# Patient Record
Sex: Female | Born: 1960 | Race: White | Hispanic: No | Smoking: Never smoker
Health system: Southern US, Community
[De-identification: ages and names within clinical notes are randomized; demographics above are authoritative.]

## PROBLEM LIST (undated history)

## (undated) DIAGNOSIS — F419 Anxiety disorder, unspecified: Secondary | ICD-10-CM

## (undated) DIAGNOSIS — M1990 Unspecified osteoarthritis, unspecified site: Secondary | ICD-10-CM

## (undated) DIAGNOSIS — T7840XA Allergy, unspecified, initial encounter: Secondary | ICD-10-CM

## (undated) DIAGNOSIS — E876 Hypokalemia: Secondary | ICD-10-CM

## (undated) DIAGNOSIS — R112 Nausea with vomiting, unspecified: Secondary | ICD-10-CM

## (undated) DIAGNOSIS — J4530 Mild persistent asthma, uncomplicated: Secondary | ICD-10-CM

## (undated) DIAGNOSIS — E785 Hyperlipidemia, unspecified: Secondary | ICD-10-CM

## (undated) DIAGNOSIS — E669 Obesity, unspecified: Secondary | ICD-10-CM

## (undated) DIAGNOSIS — Z124 Encounter for screening for malignant neoplasm of cervix: Principal | ICD-10-CM

## (undated) DIAGNOSIS — I499 Cardiac arrhythmia, unspecified: Secondary | ICD-10-CM

## (undated) DIAGNOSIS — Z0000 Encounter for general adult medical examination without abnormal findings: Secondary | ICD-10-CM

## (undated) DIAGNOSIS — R252 Cramp and spasm: Secondary | ICD-10-CM

## (undated) DIAGNOSIS — R011 Cardiac murmur, unspecified: Secondary | ICD-10-CM

## (undated) DIAGNOSIS — M1712 Unilateral primary osteoarthritis, left knee: Secondary | ICD-10-CM

## (undated) DIAGNOSIS — M722 Plantar fascial fibromatosis: Secondary | ICD-10-CM

## (undated) DIAGNOSIS — C801 Malignant (primary) neoplasm, unspecified: Secondary | ICD-10-CM

## (undated) DIAGNOSIS — I10 Essential (primary) hypertension: Secondary | ICD-10-CM

## (undated) DIAGNOSIS — Z9889 Other specified postprocedural states: Secondary | ICD-10-CM

## (undated) DIAGNOSIS — R609 Edema, unspecified: Secondary | ICD-10-CM

## (undated) DIAGNOSIS — J329 Chronic sinusitis, unspecified: Secondary | ICD-10-CM

## (undated) HISTORY — PX: TUBAL LIGATION: SHX77

## (undated) HISTORY — DX: Edema, unspecified: R60.9

## (undated) HISTORY — DX: Anxiety disorder, unspecified: F41.9

## (undated) HISTORY — DX: Essential (primary) hypertension: I10

## (undated) HISTORY — DX: Encounter for general adult medical examination without abnormal findings: Z00.00

## (undated) HISTORY — DX: Cramp and spasm: R25.2

## (undated) HISTORY — DX: Plantar fascial fibromatosis: M72.2

## (undated) HISTORY — DX: Mild persistent asthma, uncomplicated: J45.30

## (undated) HISTORY — DX: Chronic sinusitis, unspecified: J32.9

## (undated) HISTORY — DX: Encounter for screening for malignant neoplasm of cervix: Z12.4

## (undated) HISTORY — DX: Allergy, unspecified, initial encounter: T78.40XA

## (undated) HISTORY — PX: ABDOMINAL HYSTERECTOMY: SHX81

## (undated) HISTORY — DX: Hypokalemia: E87.6

## (undated) HISTORY — DX: Obesity, unspecified: E66.9

## (undated) HISTORY — DX: Hyperlipidemia, unspecified: E78.5

## (undated) HISTORY — PX: TONSILLECTOMY: SHX5217

## (undated) HISTORY — DX: Unilateral primary osteoarthritis, left knee: M17.12

---
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
## 1998-11-24 ENCOUNTER — Other Ambulatory Visit: Admission: RE | Admit: 1998-11-24 | Discharge: 1998-11-24 | Payer: Self-pay | Admitting: Obstetrics and Gynecology

## 2000-03-19 ENCOUNTER — Encounter: Admission: RE | Admit: 2000-03-19 | Discharge: 2000-03-19 | Payer: Self-pay | Admitting: Obstetrics and Gynecology

## 2000-03-19 ENCOUNTER — Encounter: Payer: Self-pay | Admitting: Obstetrics and Gynecology

## 2000-03-21 ENCOUNTER — Other Ambulatory Visit: Admission: RE | Admit: 2000-03-21 | Discharge: 2000-03-21 | Payer: Self-pay | Admitting: *Deleted

## 2003-06-19 ENCOUNTER — Ambulatory Visit: Admission: RE | Admit: 2003-06-19 | Discharge: 2003-06-19 | Payer: Self-pay | Admitting: *Deleted

## 2003-07-07 ENCOUNTER — Other Ambulatory Visit: Admission: RE | Admit: 2003-07-07 | Discharge: 2003-07-07 | Payer: Self-pay | Admitting: Obstetrics and Gynecology

## 2003-07-09 ENCOUNTER — Encounter: Admission: RE | Admit: 2003-07-09 | Discharge: 2003-07-09 | Payer: Self-pay | Admitting: Obstetrics and Gynecology

## 2003-09-07 ENCOUNTER — Ambulatory Visit (HOSPITAL_COMMUNITY): Admission: RE | Admit: 2003-09-07 | Discharge: 2003-09-07 | Payer: Self-pay | Admitting: Obstetrics and Gynecology

## 2004-01-10 HISTORY — PX: ENDOMETRIAL ABLATION: SHX621

## 2004-01-10 HISTORY — PX: KNEE SURGERY: SHX244

## 2004-08-15 ENCOUNTER — Encounter: Admission: RE | Admit: 2004-08-15 | Discharge: 2004-08-15 | Payer: Self-pay | Admitting: Obstetrics and Gynecology

## 2005-10-04 ENCOUNTER — Encounter: Admission: RE | Admit: 2005-10-04 | Discharge: 2005-10-04 | Payer: Self-pay | Admitting: Obstetrics and Gynecology

## 2005-10-24 ENCOUNTER — Other Ambulatory Visit: Admission: RE | Admit: 2005-10-24 | Discharge: 2005-10-24 | Payer: Self-pay | Admitting: Obstetrics and Gynecology

## 2008-01-10 DIAGNOSIS — I10 Essential (primary) hypertension: Secondary | ICD-10-CM

## 2008-01-10 HISTORY — DX: Essential (primary) hypertension: I10

## 2009-07-09 LAB — HM PAP SMEAR

## 2009-07-09 LAB — HM MAMMOGRAPHY

## 2011-01-30 ENCOUNTER — Ambulatory Visit: Payer: Self-pay | Admitting: Family Medicine

## 2011-02-08 ENCOUNTER — Encounter: Payer: Self-pay | Admitting: Family Medicine

## 2011-02-08 ENCOUNTER — Ambulatory Visit (INDEPENDENT_AMBULATORY_CARE_PROVIDER_SITE_OTHER): Payer: 59 | Admitting: Family Medicine

## 2011-02-08 VITALS — BP 132/87 | HR 101 | Temp 98.2°F | Ht 68.0 in | Wt 228.8 lb

## 2011-02-08 DIAGNOSIS — J329 Chronic sinusitis, unspecified: Secondary | ICD-10-CM

## 2011-02-08 DIAGNOSIS — T7840XA Allergy, unspecified, initial encounter: Secondary | ICD-10-CM

## 2011-02-08 DIAGNOSIS — E669 Obesity, unspecified: Secondary | ICD-10-CM | POA: Insufficient documentation

## 2011-02-08 DIAGNOSIS — Z0000 Encounter for general adult medical examination without abnormal findings: Secondary | ICD-10-CM

## 2011-02-08 DIAGNOSIS — J40 Bronchitis, not specified as acute or chronic: Secondary | ICD-10-CM

## 2011-02-08 DIAGNOSIS — Z9109 Other allergy status, other than to drugs and biological substances: Secondary | ICD-10-CM | POA: Insufficient documentation

## 2011-02-08 DIAGNOSIS — F419 Anxiety disorder, unspecified: Secondary | ICD-10-CM | POA: Insufficient documentation

## 2011-02-08 DIAGNOSIS — I10 Essential (primary) hypertension: Secondary | ICD-10-CM | POA: Insufficient documentation

## 2011-02-08 DIAGNOSIS — F411 Generalized anxiety disorder: Secondary | ICD-10-CM

## 2011-02-08 DIAGNOSIS — Z23 Encounter for immunization: Secondary | ICD-10-CM

## 2011-02-08 LAB — RENAL FUNCTION PANEL
BUN: 14 mg/dL (ref 6–23)
CO2: 29 mEq/L (ref 19–32)
Chloride: 102 mEq/L (ref 96–112)
GFR: 113.85 mL/min (ref >60.00)
Phosphorus: 2.9 mg/dL (ref 2.3–4.6)
Sodium: 142 mEq/L (ref 135–145)

## 2011-02-08 LAB — HEPATIC FUNCTION PANEL
AST: 27 U/L (ref 0–37)
Albumin: 4.2 g/dL (ref 3.5–5.2)
Alkaline Phosphatase: 79 U/L (ref 39–117)
Total Bilirubin: 0.5 mg/dL (ref 0.3–1.2)

## 2011-02-08 LAB — LIPID PANEL
HDL: 35.1 mg/dL — ABNORMAL LOW (ref >39.00)
Triglycerides: 212 mg/dL — ABNORMAL HIGH (ref 0.0–149.0)
VLDL: 42.4 mg/dL — ABNORMAL HIGH (ref 0.0–40.0)

## 2011-02-08 LAB — CBC
Hemoglobin: 14.5 g/dL (ref 12.0–15.0)
MCHC: 34.7 g/dL (ref 30.0–36.0)
RDW: 12.9 % (ref 11.5–14.6)
WBC: 8.8 10*3/uL (ref 4.5–10.5)

## 2011-02-08 MED ORDER — LEVOFLOXACIN 500 MG PO TABS: 500.0000 mg | ORAL_TABLET | Freq: Every day | ORAL | Status: AC

## 2011-02-08 MED ORDER — AMLODIPINE BESYLATE-VALSARTAN 5-160 MG PO TABS: 1.0000 | ORAL_TABLET | Freq: Every day | ORAL | Status: DC

## 2011-02-08 NOTE — Patient Instructions (Signed)

## 2011-02-09 LAB — TSH: TSH: 0.93 u[IU]/mL (ref 0.35–5.50)

## 2011-02-13 ENCOUNTER — Encounter: Payer: Self-pay | Admitting: Family Medicine

## 2011-02-13 DIAGNOSIS — J329 Chronic sinusitis, unspecified: Secondary | ICD-10-CM | POA: Insufficient documentation

## 2011-02-13 DIAGNOSIS — Z0000 Encounter for general adult medical examination without abnormal findings: Secondary | ICD-10-CM

## 2011-02-13 HISTORY — DX: Chronic sinusitis, unspecified: J32.9

## 2011-02-13 HISTORY — DX: Encounter for general adult medical examination without abnormal findings: Z00.00

## 2011-02-13 NOTE — Assessment & Plan Note (Signed)
Increase rest and fluids, Levaquin and Mucinex prescribed and notify us if no improvement

## 2011-02-13 NOTE — Assessment & Plan Note (Signed)
Consider DASH diet and increase activity

## 2011-02-13 NOTE — Assessment & Plan Note (Signed)
Doing well at this time  

## 2011-02-13 NOTE — Assessment & Plan Note (Signed)
Borderline BP today, minimize sodium and reassess at next visit.

## 2011-02-13 NOTE — Progress Notes (Signed)
Patient ID: Kathleen Church, female   DOB: [birth year], 51 y.o.   MRN: [id number] Kathleen Church [id number] [birth year] 02/13/2011      Progress Note New Patient  Subjective  Chief Complaint  Chief Complaint  Patient presents with  . Establish Care    new patient    HPI  She is a 51 year old Caucasian female who is in today for new patient appointment. She's been struggling with respiratory symptoms for the last week. Recently has had a Z-Pak and did not feel that was helpful. She has cough productive of green phlegm at times. Nasal congestion but no rhinorrhea. Some postnasal drip and malaise are also noted. Denies fevers, headaches, ear pain. Denies chest pain, palpitations. Does speak of allergies and flares in difficulty with some wheezing and shortness of breath whenever she becomes ill her allergies flare. Does use albuterol at times could affect when this happens. No other recent complaints. He underwent an endometrial ablation in 2006 for heavy bleeding but is having no difficulties now. No GI or GU complaints.  Past Medical History  Diagnosis Date  . Chicken pox as a child  . Measles as a child  . Mumps as a child  . Anxiety     when father passed  . Hypertension 2010  . Allergic state   . Obesity   . Preventative health care 02/13/2011  . Sinusitis 02/13/2011    Past Surgical History  Procedure Date  . Endometrial ablation 2006    menorraghia  . Knee surgery 2006    left knee  . Tonsillectomy   . Tubal ligation     Family History  Problem Relation Age of Onset  . Cancer Father     kidney  . Cancer Maternal Grandmother     bone  . Cancer Paternal Grandmother     breast  . Asthma Daughter   . Heart attack Paternal Grandfather     History   Social History  . Marital Status: Married    Spouse Name: N/A    Number of Children: N/A  . Years of Education: N/A   Occupational History  . Not on file.   Social History Main Topics  . Smoking status: Never  Smoker   . Smokeless tobacco: Never Used  . Alcohol Use: No  . Drug Use: No  . Sexually Active: Yes -- Female partner(s)   Other Topics Concern  . Not on file   Social History Narrative  . No narrative on file    No current outpatient prescriptions on file prior to visit.    Allergies  Allergen Reactions  . Codeine Palpitations and Rash    Review of Systems  Review of Systems  Constitutional: Negative for fever, chills and malaise/fatigue.  HENT: Positive for congestion. Negative for hearing loss and nosebleeds.   Eyes: Negative for discharge.  Respiratory: Positive for cough, sputum production and shortness of breath. Negative for wheezing.   Cardiovascular: Negative for chest pain, palpitations and leg swelling.  Gastrointestinal: Negative for heartburn, nausea, vomiting, abdominal pain, diarrhea, constipation and blood in stool.  Genitourinary: Negative for dysuria, urgency, frequency and hematuria.       No h/o abnl paps, last pap in 07/2009, last [facility] 7/11  Musculoskeletal: Negative for myalgias, back pain and falls.  Skin: Negative for rash.  Neurological: Negative for dizziness, tremors, sensory change, focal weakness, loss of consciousness, weakness and headaches.  Endo/Heme/Allergies: Negative for polydipsia. Does not bruise/bleed easily.  Psychiatric/Behavioral: Negative for depression and  suicidal ideas. The patient is not nervous/anxious and does not have insomnia.     Objective  BP 132/87  Pulse 101  Temp(Src) 98.2 F (36.8 C) (Temporal)  Ht 5\' 8"  (1.727 m)  Wt 228 lb 12.8 oz (103.783 kg)  BMI 34.79 kg/m2  SpO2 96%  Physical Exam  Physical Exam  Constitutional: She is oriented to person, place, and time and well-developed, well-nourished, and in no distress. No distress.  HENT:  Head: Normocephalic and atraumatic.  Right Ear: External ear normal.  Left Ear: External ear normal.  Nose: Nose normal.  Mouth/Throat: Oropharynx is clear and moist. No  oropharyngeal exudate.  Eyes: Conjunctivae are normal. Pupils are equal, round, and reactive to light. Right eye exhibits no discharge. Left eye exhibits no discharge. No scleral icterus.  Neck: Normal range of motion. Neck supple. No thyromegaly present.  Cardiovascular: Normal rate, regular rhythm, normal heart sounds and intact distal pulses.   No murmur heard. Pulmonary/Chest: Effort normal and breath sounds normal. No respiratory distress. She has no wheezes. She has no rales.  Abdominal: Soft. Bowel sounds are normal. She exhibits no distension and no mass. There is no tenderness.  Musculoskeletal: Normal range of motion. She exhibits no edema and no tenderness.  Lymphadenopathy:    She has no cervical adenopathy.  Neurological: She is alert and oriented to person, place, and time. She has normal reflexes. No cranial nerve deficit. Coordination normal.  Skin: Skin is warm and dry. No rash noted. She is not diaphoretic.  Psychiatric: Mood, memory and affect normal.       Assessment & Plan  Preventative health care Patient agrees to [facility] and FOBT today but declines colonoscopy despite long discussion on benefits, will let us know if she changes her mind  Hypertension Borderline BP today, minimize sodium and reassess at next visit.  Sinusitis Increase rest and fluids, Levaquin and Mucinex prescribed and notify us if no improvement  Anxiety Doing well at this time  Obesity Consider DASH diet and increase activity  Allergic state Has some SOB with illness and allergic flares, responsive to Albuterol consider some allergy triggered Asthma, will cont Albuterol prn and Singulair daily

## 2011-02-13 NOTE — Assessment & Plan Note (Signed)
Patient agrees to [facility] and FOBT today but declines colonoscopy despite long discussion on benefits, will let us know if she changes her mind

## 2011-02-13 NOTE — Assessment & Plan Note (Signed)
Has some SOB with illness and allergic flares, responsive to Albuterol consider some allergy triggered Asthma, will cont Albuterol prn and Singulair daily

## 2011-02-22 ENCOUNTER — Other Ambulatory Visit: Payer: Self-pay | Admitting: Family Medicine

## 2011-02-22 DIAGNOSIS — Z1231 Encounter for screening mammogram for malignant neoplasm of breast: Secondary | ICD-10-CM

## 2011-02-24 ENCOUNTER — Encounter: Payer: Self-pay | Admitting: Family Medicine

## 2011-02-24 ENCOUNTER — Ambulatory Visit (INDEPENDENT_AMBULATORY_CARE_PROVIDER_SITE_OTHER): Payer: 59 | Admitting: Family Medicine

## 2011-02-24 VITALS — BP 133/85 | HR 100 | Temp 97.3°F | Wt 235.0 lb

## 2011-02-24 DIAGNOSIS — J45909 Unspecified asthma, uncomplicated: Secondary | ICD-10-CM

## 2011-02-24 DIAGNOSIS — T7840XA Allergy, unspecified, initial encounter: Secondary | ICD-10-CM

## 2011-02-24 MED ORDER — PREDNISONE 20 MG PO TABS: ORAL_TABLET | ORAL | Status: DC

## 2011-02-24 MED ORDER — HYDROCOD POLST-CHLORPHEN POLST 10-8 MG/5ML PO LQCR: 5.0000 mL | Freq: Two times a day (BID) | ORAL | Status: DC

## 2011-02-24 MED ORDER — ALBUTEROL SULFATE HFA 108 (90 BASE) MCG/ACT IN AERS: 2.0000 | INHALATION_SPRAY | Freq: Four times a day (QID) | RESPIRATORY_TRACT | Status: DC | PRN

## 2011-02-24 NOTE — Progress Notes (Signed)
OFFICE NOTE  02/24/2011  CC:  Chief Complaint  Patient presents with  . Cough    x 2 weeks     HPI: Patient is a 51 y.o. Caucasian female who is here for cough. Pt presents complaining of respiratory symptoms for 2-3 weeks.  Mostly dry cough.  Some wheezing occasionally.  Tm 100.  Albuterol inhaler helps some.  Worst symptoms seems to be the persistent dry cough/hacky--keeping her up all night.  Lately the symptoms seem to be staying the same.  No pain in face or teeth.  No significant HA.  ST mild at most.  Symptoms made worse by cool air, night time.  Symptoms improved by albuterol and mucinex congestion OTC. Smoker? no Recent sick contact? unknown Muscle or joint aches? no Flu shot this season at least 2 wks ago? yes  ROS: no n/v/d or abdominal pain.  No rash.  No neck stiffness.   +Mild fatigue.  +Mild appetite loss.    Pertinent PMH:  Past Medical History  Diagnosis Date  . Asthma, mild persistent     adult onset  . Measles as a child  . Mumps as a child  . Anxiety     when father passed  . Hypertension 2010  . Allergic state   . Obesity   . Preventative health care 02/13/2011  . Sinusitis 02/13/2011     MEDS:  Outpatient Prescriptions Prior to Visit  Medication Sig Dispense Refill  . albuterol (PROVENTIL HFA;VENTOLIN HFA) 108 (90 BASE) MCG/ACT inhaler Inhale 2 puffs into the lungs every 6 (six) hours as needed.      . ALPRAZolam (XANAX) 0.5 MG tablet Take 0.5 mg by mouth every 6 (six) hours as needed.      Marland Kitchen amLODipine-valsartan (EXFORGE) 5-160 MG per tablet Take 1 tablet by mouth daily.  30 tablet  3  . montelukast (SINGULAIR) 10 MG tablet Take 10 mg by mouth at bedtime.        PE: Blood pressure 133/85, pulse 100, temperature 97.3 F (36.3 C), temperature source Temporal, weight 235 lb (106.595 kg), SpO2 97.00%. VS: noted--normal. Gen: alert, NAD, NONTOXIC APPEARING. HEENT: eyes without injection, drainage, or swelling.  Ears: EACs clear, TMs with normal  light reflex and landmarks.  Nose: Clear rhinorrhea, with some dried, crusty exudate adherent to mildly injected mucosa.  No purulent d/c.  No paranasal sinus TTP.  No facial swelling.  Throat and mouth without focal lesion.  No pharyngial swelling, erythema, or exudate.   Neck: supple, no LAD.   LUNGS: Inspiratory rhonchi diffusely.  Decreased aeration on exhalation.  No wheezing or crackles.  Nonlabored resps.   CV: RRR, no m/r/g. EXT: no c/c/e SKIN: no rash    IMPRESSION AND PLAN: Asthmatic bronchitis Prednisone 40mg  qd x 5d, then 20mg  qd x 5d. Albuterol q4h prn. Tussionex 1 tsp q12 h prn. Start pulmicort 180 HFA, 1 puff bid---she has shown sufficient chronicity of sx's to qualify for controller med.      FOLLOW UP: she has f/u in March with Dr. Abner Greenspan

## 2011-02-27 ENCOUNTER — Encounter: Payer: Self-pay | Admitting: Family Medicine

## 2011-02-27 DIAGNOSIS — J45909 Unspecified asthma, uncomplicated: Secondary | ICD-10-CM | POA: Insufficient documentation

## 2011-02-27 NOTE — Assessment & Plan Note (Signed)
Prednisone 40mg  qd x 5d, then 20mg  qd x 5d. Albuterol q4h prn. Tussionex 1 tsp q12 h prn. Start pulmicort 180 HFA, 1 puff bid---she has shown sufficient chronicity of sx's to qualify for controller med.

## 2011-02-28 ENCOUNTER — Telehealth: Payer: Self-pay | Admitting: *Deleted

## 2011-02-28 MED ORDER — AZITHROMYCIN 250 MG PO TABS: 250.0000 mg | ORAL_TABLET | Freq: Every day | ORAL | Status: DC

## 2011-02-28 NOTE — Telephone Encounter (Signed)
Left a detailed message on patients voicemail and sent RX to pharmacy 

## 2011-02-28 NOTE — Telephone Encounter (Signed)
Pt was seen on 2/15 by Dr. Milinda Cave and diagnosed with asthma.  Pt still has cough, and now that cough is productive with green mucous.  Cough is worse when pt lies down.  Unsure if she has had fever.  Pt was in office with daughter yesterday and states you were able to hear her cough. Pt would like RX for abx. Please advise if appropriate.

## 2011-02-28 NOTE — Telephone Encounter (Signed)
Have her increase her Pulmicort to 2 puffs po bid and give her Azithromycin 250 mg tab, 2 tabs po once and then 1 tab po daily x 4 days.

## 2011-03-06 ENCOUNTER — Ambulatory Visit
Admission: RE | Admit: 2011-03-06 | Discharge: 2011-03-06 | Disposition: A | Discharge status: Home / Self Care | Payer: 59 | Source: Ambulatory Visit | Attending: Family Medicine | Admitting: Family Medicine

## 2011-03-06 DIAGNOSIS — Z1231 Encounter for screening mammogram for malignant neoplasm of breast: Secondary | ICD-10-CM

## 2011-03-06 LAB — HM MAMMOGRAPHY

## 2011-03-27 ENCOUNTER — Ambulatory Visit: Payer: 59 | Admitting: Family Medicine

## 2011-04-05 ENCOUNTER — Ambulatory Visit: Payer: 59 | Admitting: Family Medicine

## 2011-04-27 ENCOUNTER — Other Ambulatory Visit (HOSPITAL_COMMUNITY)
Admission: RE | Admit: 2011-04-27 | Discharge: 2011-04-27 | Disposition: A | Discharge status: Home / Self Care | Payer: 59 | Source: Ambulatory Visit | Attending: Family Medicine | Admitting: Family Medicine

## 2011-04-27 ENCOUNTER — Ambulatory Visit (INDEPENDENT_AMBULATORY_CARE_PROVIDER_SITE_OTHER): Payer: 59 | Admitting: Family Medicine

## 2011-04-27 ENCOUNTER — Encounter: Payer: Self-pay | Admitting: Family Medicine

## 2011-04-27 VITALS — BP 126/91 | HR 103 | Temp 98.0°F | Ht 68.0 in | Wt 232.0 lb

## 2011-04-27 DIAGNOSIS — Z124 Encounter for screening for malignant neoplasm of cervix: Secondary | ICD-10-CM

## 2011-04-27 DIAGNOSIS — J45909 Unspecified asthma, uncomplicated: Secondary | ICD-10-CM

## 2011-04-27 DIAGNOSIS — I10 Essential (primary) hypertension: Secondary | ICD-10-CM

## 2011-04-27 DIAGNOSIS — N760 Acute vaginitis: Secondary | ICD-10-CM

## 2011-04-27 DIAGNOSIS — E876 Hypokalemia: Secondary | ICD-10-CM

## 2011-04-27 DIAGNOSIS — Z01419 Encounter for gynecological examination (general) (routine) without abnormal findings: Secondary | ICD-10-CM | POA: Insufficient documentation

## 2011-04-27 DIAGNOSIS — R609 Edema, unspecified: Secondary | ICD-10-CM

## 2011-04-27 DIAGNOSIS — E785 Hyperlipidemia, unspecified: Secondary | ICD-10-CM

## 2011-04-27 DIAGNOSIS — E669 Obesity, unspecified: Secondary | ICD-10-CM

## 2011-04-27 HISTORY — DX: Edema, unspecified: R60.9

## 2011-04-27 HISTORY — DX: Encounter for screening for malignant neoplasm of cervix: Z12.4

## 2011-04-27 HISTORY — DX: Hyperlipidemia, unspecified: E78.5

## 2011-04-27 HISTORY — DX: Hypokalemia: E87.6

## 2011-04-27 LAB — RENAL FUNCTION PANEL
BUN: 13 mg/dL (ref 6–23)
CO2: 29 mEq/L (ref 19–32)
Chloride: 103 mEq/L (ref 96–112)
GFR: 111.57 mL/min (ref >60.00)
Sodium: 139 mEq/L (ref 135–145)

## 2011-04-27 MED ORDER — AMLODIPINE BESYLATE-VALSARTAN 5-160 MG PO TABS: 1.0000 | ORAL_TABLET | Freq: Every day | ORAL | Status: DC

## 2011-04-27 MED ORDER — BUDESONIDE 180 MCG/ACT IN AEPB: 2.0000 | INHALATION_SPRAY | Freq: Two times a day (BID) | RESPIRATORY_TRACT | Status: DC

## 2011-04-27 NOTE — Assessment & Plan Note (Signed)
Elevate feet, avoid sodium

## 2011-04-27 NOTE — Assessment & Plan Note (Addendum)
Pap today, no concerns. Some discharge noted will check candida and BV today as well. If any further bleeding will need a work up

## 2011-04-27 NOTE — Progress Notes (Signed)
Patient ID: Kathleen Church, female   DOB: May 17, 1960, 51 y.o.   MRN: [id number] Kathleen Church [id number] 1960-07-27 04/27/2011      Progress Note-Follow Up  Subjective  Chief Complaint  Chief Complaint  Patient presents with  . Gynecologic Exam    pap    HPI  This is a 51 year old Caucasian female in today for GYN visit and followup. Overall she reports she feels well. She was seen recently with asthmatic bronchitis and started on Pulmicort. She's had a good response. She says this time every year she tends to get bronchitis or sinusitis and so far that has not happened. She denies any coughing, wheezing recent albuterol use, fevers or chills. She denies any chest pain or palpitations, shortness of breath, headache, congestion, GI or GU complaints. Should her mammogram in February which was normal.  Past Medical History  Diagnosis Date  . Asthma, mild persistent     adult onset  . Measles as a child  . Mumps as a child  . Anxiety     when father passed  . Hypertension 2010  . Allergic state   . Obesity   . Preventative health care 02/13/2011  . Sinusitis 02/13/2011  . Cervical cancer screening 04/27/2011  . Hypokalemia 04/27/2011  . Edema 04/27/2011    Past Surgical History  Procedure Date  . Endometrial ablation 2006    menorraghia  . Knee surgery 2006    left knee  . Tonsillectomy   . Tubal ligation     Family History  Problem Relation Age of Onset  . Cancer Father     kidney  . Cancer Maternal Grandmother     bone  . Cancer Paternal Grandmother     breast  . Asthma Daughter   . Heart attack Paternal Grandfather     History   Social History  . Marital Status: Married    Spouse Name: N/A    Number of Children: N/A  . Years of Education: N/A   Occupational History  . Not on file.   Social History Main Topics  . Smoking status: Never Smoker   . Smokeless tobacco: Never Used  . Alcohol Use: No  . Drug Use: No  . Sexually Active: Yes -- Female partner(s)    Other Topics Concern  . Not on file   Social History Narrative  . No narrative on file    Current Outpatient Prescriptions on File Prior to Visit  Medication Sig Dispense Refill  . albuterol (PROVENTIL HFA;VENTOLIN HFA) 108 (90 BASE) MCG/ACT inhaler Inhale 2 puffs into the lungs every 6 (six) hours as needed.  1 Inhaler  0  . montelukast (SINGULAIR) 10 MG tablet Take 10 mg by mouth at bedtime.      Marland Kitchen DISCONTD: amLODipine-valsartan (EXFORGE) 5-160 MG per tablet Take 1 tablet by mouth daily.  30 tablet  3  . DISCONTD: budesonide (PULMICORT) 180 MCG/ACT inhaler Inhale 2 puffs into the lungs 2 (two) times daily.       Marland Kitchen ALPRAZolam (XANAX) 0.5 MG tablet Take 0.5 mg by mouth every 6 (six) hours as needed.        Allergies  Allergen Reactions  . Codeine Palpitations and Rash    Review of Systems  Review of Systems  Constitutional: Positive for malaise/fatigue. Negative for fever.  HENT: Negative for congestion.   Eyes: Negative for discharge.  Respiratory: Negative for shortness of breath.   Cardiovascular: Positive for leg swelling. Negative for chest pain and palpitations.  Gastrointestinal: Negative for nausea, abdominal pain and diarrhea.  Genitourinary: Negative for dysuria.  Musculoskeletal: Negative for falls.  Skin: Negative for rash.  Neurological: Negative for loss of consciousness and headaches.  Endo/Heme/Allergies: Negative for polydipsia.  Psychiatric/Behavioral: Negative for depression and suicidal ideas. The patient is not nervous/anxious and does not have insomnia.     Objective  BP 126/91  Pulse 103  Temp(Src) 98 F (36.7 C) (Temporal)  Ht 5\' 8"  (1.727 m)  Wt 232 lb (105.235 kg)  BMI 35.28 kg/m2  SpO2 95%  Physical Exam  Physical Exam  Constitutional: She is oriented to person, place, and time and well-developed, well-nourished, and in no distress. No distress.  HENT:  Head: Normocephalic and atraumatic.  Eyes: Conjunctivae are normal.  Neck:  Neck supple. No thyromegaly present.  Cardiovascular: Normal rate, regular rhythm and normal heart sounds.   No murmur heard. Pulmonary/Chest: Effort normal and breath sounds normal. She has no wheezes.  Abdominal: She exhibits no distension and no mass.  Genitourinary: Uterus normal, right adnexa normal and left adnexa normal. Vaginal discharge found.       Thick white, vaginal discharge, cervix with slight bleeding with pap  Musculoskeletal: She exhibits no edema.  Lymphadenopathy:    She has no cervical adenopathy.  Neurological: She is alert and oriented to person, place, and time.  Skin: Skin is warm and dry. No rash noted. She is not diaphoretic.  Psychiatric: Memory, affect and judgment normal.    Lab Results  Component Value Date   TSH 0.93 02/08/2011   Lab Results  Component Value Date   WBC 8.8 02/08/2011   HGB 14.5 02/08/2011   HCT 41.8 02/08/2011   MCV 93.1 02/08/2011   PLT 352.0 02/08/2011   Lab Results  Component Value Date   CREATININE 0.6 02/08/2011   BUN 14 02/08/2011   NA 142 02/08/2011   K 3.3* 02/08/2011   CL 102 02/08/2011   CO2 29 02/08/2011   Lab Results  Component Value Date   ALT 34 02/08/2011   AST 27 02/08/2011   ALKPHOS 79 02/08/2011   BILITOT 0.5 02/08/2011   Lab Results  Component Value Date   CHOL 209* 02/08/2011   Lab Results  Component Value Date   HDL 35.10* 02/08/2011   No results found for this basename: [facility]   Lab Results  Component Value Date   TRIG 212.0* 02/08/2011   Lab Results  Component Value Date   CHOLHDL 6 02/08/2011     Assessment & Plan  Cervical cancer screening Pap today, no concerns. Some discharge noted will check candida and BV today as well. If any further bleeding will need a work up  Asthmatic bronchitis Resolved with addition of Pulmicort will continue, refill and coupon given  Hypertension Adequately controlled on current meds. No changes refill given  Hypokalemia Mildly depressed and some cramping in  feet noted, recheck renal today  Obesity Encouraged increased activity  Edema Elevate feet, avoid sodium  Hyperlipidemia Mild avoid trans fats, start MegaRed krill oil caps daily recheck in 6 months, increase exercsie

## 2011-04-27 NOTE — Assessment & Plan Note (Signed)
Adequately controlled on current meds. No changes refill given

## 2011-04-27 NOTE — Assessment & Plan Note (Signed)
Mild avoid trans fats, start MegaRed krill oil caps daily recheck in 6 months, increase exercsie

## 2011-04-27 NOTE — Assessment & Plan Note (Signed)
Mildly depressed and some cramping in feet noted, recheck renal today

## 2011-04-27 NOTE — Assessment & Plan Note (Signed)
Encouraged increased activity.

## 2011-04-27 NOTE — Patient Instructions (Signed)

## 2011-04-27 NOTE — Assessment & Plan Note (Signed)
Resolved with addition of Pulmicort will continue, refill and coupon given

## 2011-05-09 ENCOUNTER — Other Ambulatory Visit: Payer: 59

## 2011-05-09 DIAGNOSIS — Z1211 Encounter for screening for malignant neoplasm of colon: Secondary | ICD-10-CM

## 2011-05-09 LAB — FECAL OCCULT BLOOD, IMMUNOCHEMICAL: Fecal Occult Bld: NEGATIVE

## 2011-06-20 ENCOUNTER — Other Ambulatory Visit: Payer: Self-pay

## 2011-06-20 DIAGNOSIS — T7840XA Allergy, unspecified, initial encounter: Secondary | ICD-10-CM

## 2011-06-20 MED ORDER — MONTELUKAST SODIUM 10 MG PO TABS: 10.0000 mg | ORAL_TABLET | Freq: Every day | ORAL | Status: DC

## 2011-07-12 ENCOUNTER — Other Ambulatory Visit: Payer: Self-pay

## 2011-07-12 DIAGNOSIS — I10 Essential (primary) hypertension: Secondary | ICD-10-CM

## 2011-07-12 MED ORDER — AMLODIPINE BESYLATE-VALSARTAN 5-160 MG PO TABS: 1.0000 | ORAL_TABLET | Freq: Every day | ORAL | Status: DC

## 2011-10-11 ENCOUNTER — Other Ambulatory Visit: Payer: Self-pay

## 2011-10-11 DIAGNOSIS — I10 Essential (primary) hypertension: Secondary | ICD-10-CM

## 2011-10-11 MED ORDER — AMLODIPINE BESYLATE-VALSARTAN 5-160 MG PO TABS: 1.0000 | ORAL_TABLET | Freq: Every day | ORAL | Status: DC

## 2011-12-13 ENCOUNTER — Other Ambulatory Visit: Payer: Self-pay | Admitting: Family Medicine

## 2012-01-12 ENCOUNTER — Other Ambulatory Visit: Payer: Self-pay | Admitting: Family Medicine

## 2012-03-13 ENCOUNTER — Other Ambulatory Visit: Payer: Self-pay | Admitting: Family Medicine

## 2012-03-13 NOTE — Telephone Encounter (Signed)
RX filled and left a detailed message stating that pt will need an appt for any addt refills

## 2012-03-15 ENCOUNTER — Ambulatory Visit: Payer: 59 | Admitting: Family Medicine

## 2012-04-15 ENCOUNTER — Telehealth: Payer: Self-pay | Admitting: Family Medicine

## 2012-04-15 MED ORDER — AMLODIPINE BESYLATE-VALSARTAN 5-160 MG PO TABS: 1.0000 | ORAL_TABLET | Freq: Every day | ORAL | Status: DC

## 2012-04-15 NOTE — Telephone Encounter (Signed)
19 tablets sent to pharmacy. Pt will have to keep this appt for addt refills

## 2012-05-03 ENCOUNTER — Encounter: Payer: Self-pay | Admitting: Family Medicine

## 2012-05-03 ENCOUNTER — Ambulatory Visit (INDEPENDENT_AMBULATORY_CARE_PROVIDER_SITE_OTHER): Payer: 59 | Admitting: Family Medicine

## 2012-05-03 VITALS — BP 145/89 | HR 96 | Temp 97.8°F | Ht 68.0 in | Wt 236.8 lb

## 2012-05-03 DIAGNOSIS — I10 Essential (primary) hypertension: Secondary | ICD-10-CM

## 2012-05-03 DIAGNOSIS — E785 Hyperlipidemia, unspecified: Secondary | ICD-10-CM

## 2012-05-03 DIAGNOSIS — F411 Generalized anxiety disorder: Secondary | ICD-10-CM

## 2012-05-03 DIAGNOSIS — Z5189 Encounter for other specified aftercare: Secondary | ICD-10-CM

## 2012-05-03 DIAGNOSIS — Z0000 Encounter for general adult medical examination without abnormal findings: Secondary | ICD-10-CM

## 2012-05-03 DIAGNOSIS — E669 Obesity, unspecified: Secondary | ICD-10-CM

## 2012-05-03 DIAGNOSIS — E876 Hypokalemia: Secondary | ICD-10-CM

## 2012-05-03 DIAGNOSIS — R609 Edema, unspecified: Secondary | ICD-10-CM

## 2012-05-03 DIAGNOSIS — Z124 Encounter for screening for malignant neoplasm of cervix: Secondary | ICD-10-CM

## 2012-05-03 DIAGNOSIS — R252 Cramp and spasm: Secondary | ICD-10-CM

## 2012-05-03 DIAGNOSIS — F419 Anxiety disorder, unspecified: Secondary | ICD-10-CM

## 2012-05-03 DIAGNOSIS — T7840XD Allergy, unspecified, subsequent encounter: Secondary | ICD-10-CM

## 2012-05-03 LAB — TSH: TSH: 1.12 u[IU]/mL (ref 0.35–5.50)

## 2012-05-03 LAB — HEPATIC FUNCTION PANEL
Albumin: 4 g/dL (ref 3.5–5.2)
Bilirubin, Direct: 0.1 mg/dL (ref 0.0–0.3)
Total Protein: 7.3 g/dL (ref 6.0–8.3)

## 2012-05-03 LAB — LIPID PANEL
Cholesterol: 198 mg/dL (ref 0–200)
LDL Cholesterol: 133 mg/dL — ABNORMAL HIGH (ref 0–99)
Total CHOL/HDL Ratio: 6
Triglycerides: 170 mg/dL — ABNORMAL HIGH (ref 0.0–149.0)
VLDL: 34 mg/dL (ref 0.0–40.0)

## 2012-05-03 LAB — CBC
HCT: 43 % (ref 36.0–46.0)
RDW: 12.8 % (ref 11.5–14.6)
WBC: 9.3 10*3/uL (ref 4.5–10.5)

## 2012-05-03 LAB — RENAL FUNCTION PANEL
Albumin: 4 g/dL (ref 3.5–5.2)
Calcium: 9.3 mg/dL (ref 8.4–10.5)
Chloride: 102 mEq/L (ref 96–112)
Creatinine, Ser: 0.6 mg/dL (ref 0.4–1.2)
Potassium: 3.9 mEq/L (ref 3.5–5.1)

## 2012-05-03 MED ORDER — VALSARTAN-HYDROCHLOROTHIAZIDE 160-12.5 MG PO TABS: 1.0000 | ORAL_TABLET | Freq: Every day | ORAL | Status: DC

## 2012-05-03 MED ORDER — ALPRAZOLAM 0.5 MG PO TABS: 0.5000 mg | ORAL_TABLET | Freq: Four times a day (QID) | ORAL | Status: DC | PRN

## 2012-05-03 NOTE — Patient Instructions (Signed)

## 2012-05-05 NOTE — Assessment & Plan Note (Signed)
Encouraged 8 hours of sleep, regular exercise, heart healthy diet, reviewed fasting labs with patient

## 2012-05-05 NOTE — Assessment & Plan Note (Signed)
Avoid trans fats, increase exercise, start krill oil caps, minimize saturated carbs

## 2012-05-05 NOTE — Assessment & Plan Note (Signed)
Resolved with lab work

## 2012-05-05 NOTE — Assessment & Plan Note (Signed)
Worse in pm better in am, encouraged elevate above heart, minimize sodium try compression hose.

## 2012-05-05 NOTE — Assessment & Plan Note (Signed)
Pap not due and no gyn c/o

## 2012-05-05 NOTE — Assessment & Plan Note (Signed)
tolerable with current meds continue same

## 2012-05-05 NOTE — Assessment & Plan Note (Signed)
Encouraged DASH diet, decreased po intake and increased exercise

## 2012-05-05 NOTE — Progress Notes (Signed)
Patient ID: Kathleen Church, female   DOB: 06-19-1960, 52 y.o.   MRN: [id number] [name] [id number] [birth year] 05/05/2012      Progress Note-Follow Up  Subjective  Chief Complaint  Chief Complaint  Patient presents with  . Annual Exam    physical    HPI  Patient is a 52 year old Caucasian female who is in today for annual exam. Overall she feels well. She does note some increased edema. Worse at the end of the day and better with rest. No chest pain or palpitations. No shortness of breath. No GI or GU concerns. Allergies are generally well controlled on current meds. No recent knee for baseline. No recent asthmatic flares. No greater than exercise unfortunately  Past Medical History  Diagnosis Date  . Asthma, mild persistent     adult onset  . Measles as a child  . Mumps as a child  . Anxiety     when father passed  . Hypertension 2010  . Allergic state   . Obesity   . Preventative health care 02/13/2011  . Sinusitis 02/13/2011  . Cervical cancer screening 04/27/2011  . Hypokalemia 04/27/2011  . Edema 04/27/2011  . Hyperlipidemia 04/27/2011    Past Surgical History  Procedure Laterality Date  . Endometrial ablation  2006    menorraghia  . Knee surgery  2006    left knee  . Tonsillectomy    . Tubal ligation      Family History  Problem Relation Age of Onset  . Cancer Father     kidney  . Cancer Maternal Grandmother     bone  . Cancer Paternal Grandmother     breast  . Asthma Daughter   . Heart attack Paternal Grandfather   . Asthma Daughter     History   Social History  . Marital Status: Married    Spouse Name: N/A    Number of Children: N/A  . Years of Education: N/A   Occupational History  . Not on file.   Social History Main Topics  . Smoking status: Never Smoker   . Smokeless tobacco: Never Used  . Alcohol Use: No  . Drug Use: No  . Sexually Active: Yes -- Female partner(s)   Other Topics Concern  . Not on file   Social History Narrative   . No narrative on file    Current Outpatient Prescriptions on File Prior to Visit  Medication Sig Dispense Refill  . albuterol (PROVENTIL HFA;VENTOLIN HFA) 108 (90 BASE) MCG/ACT inhaler Inhale 2 puffs into the lungs every 6 (six) hours as needed.  1 Inhaler  0  . budesonide (PULMICORT) 180 MCG/ACT inhaler Inhale 2 puffs into the lungs 2 (two) times daily.  1 Inhaler  5  . montelukast (SINGULAIR) 10 MG tablet TAKE 1 TABLET (10 MG TOTAL) BY MOUTH AT BEDTIME.  90 tablet  1   No current facility-administered medications on file prior to visit.    Allergies  Allergen Reactions  . Codeine Palpitations and Rash    Review of Systems  Review of Systems  Constitutional: Negative for fever, chills and malaise/fatigue.  HENT: Negative for hearing loss, nosebleeds and congestion.   Eyes: Negative for discharge.  Respiratory: Negative for cough, sputum production, shortness of breath and wheezing.   Cardiovascular: Positive for leg swelling. Negative for chest pain and palpitations.  Gastrointestinal: Negative for heartburn, nausea, vomiting, abdominal pain, diarrhea, constipation and blood in stool.  Genitourinary: Negative for dysuria, urgency, frequency and hematuria.  Musculoskeletal: Negative for myalgias, back pain and falls.  Skin: Negative for rash.  Neurological: Negative for dizziness, tremors, sensory change, focal weakness, loss of consciousness, weakness and headaches.  Endo/Heme/Allergies: Negative for polydipsia. Does not bruise/bleed easily.  Psychiatric/Behavioral: Negative for depression and suicidal ideas. The patient is not nervous/anxious and does not have insomnia.     Objective  BP 145/89  Pulse 96  Temp(Src) 97.8 F (36.6 C) (Temporal)  Ht 5\' 8"  (1.727 m)  Wt 236 lb 12.8 oz (107.412 kg)  BMI 36.01 kg/m2  SpO2 95%  Physical Exam  Physical Exam  Constitutional: She is oriented to person, place, and time and well-developed, well-nourished, and in no distress.  No distress.  HENT:  Head: Normocephalic and atraumatic.  Right Ear: External ear normal.  Left Ear: External ear normal.  Nose: Nose normal.  Mouth/Throat: Oropharynx is clear and moist. No oropharyngeal exudate.  Eyes: Conjunctivae and EOM are normal. Pupils are equal, round, and reactive to light. Right eye exhibits no discharge. Left eye exhibits no discharge.  Neck: Neck supple. No thyromegaly present.  Cardiovascular: Normal rate, regular rhythm and normal heart sounds.   No murmur heard. Pulmonary/Chest: Effort normal and breath sounds normal. She has no wheezes.  Abdominal: Soft. Bowel sounds are normal. She exhibits no distension and no mass. There is no tenderness. There is no rebound and no guarding.  Musculoskeletal: She exhibits no edema.  Lymphadenopathy:    She has no cervical adenopathy.  Neurological: She is alert and oriented to person, place, and time. She has normal reflexes.  Skin: Skin is warm and dry. No rash noted. She is not diaphoretic.  Psychiatric: Memory, affect and judgment normal.    Lab Results  Component Value Date   TSH 1.12 05/03/2012   Lab Results  Component Value Date   WBC 9.3 05/03/2012   HGB 14.6 05/03/2012   HCT 43.0 05/03/2012   MCV 93.4 05/03/2012   PLT 389.0 05/03/2012   Lab Results  Component Value Date   CREATININE 0.6 05/03/2012   BUN 10 05/03/2012   NA 140 05/03/2012   K 3.9 05/03/2012   CL 102 05/03/2012   CO2 29 05/03/2012   Lab Results  Component Value Date   ALT 38* 05/03/2012   AST 28 05/03/2012   ALKPHOS 93 05/03/2012   BILITOT 0.5 05/03/2012   Lab Results  Component Value Date   CHOL 198 05/03/2012   Lab Results  Component Value Date   HDL 31.30* 05/03/2012   Lab Results  Component Value Date   LDLCALC 133* 05/03/2012   Lab Results  Component Value Date   TRIG 170.0* 05/03/2012   Lab Results  Component Value Date   CHOLHDL 6 05/03/2012     Assessment & Plan  Cervical cancer screening Pap not due and no gyn  c/o  Hyperlipidemia Avoid trans fats, increase exercise, start krill oil caps, minimize saturated carbs  Obesity Encouraged DASH diet, decreased po intake and increased exercise  Allergic state tolerable with current meds continue same  Edema Worse in pm better in am, encouraged elevate above heart, minimize sodium try compression hose.  Hypokalemia Resolved with lab work  Preventative health care Encouraged 8 hours of sleep, regular exercise, heart healthy diet, reviewed fasting labs with patient

## 2012-06-07 ENCOUNTER — Other Ambulatory Visit: Payer: Self-pay | Admitting: Family Medicine

## 2012-06-14 ENCOUNTER — Ambulatory Visit: Payer: 59 | Admitting: Family Medicine

## 2012-06-25 ENCOUNTER — Encounter: Payer: Self-pay | Admitting: Family Medicine

## 2012-06-25 ENCOUNTER — Ambulatory Visit (INDEPENDENT_AMBULATORY_CARE_PROVIDER_SITE_OTHER): Payer: 59 | Admitting: Family Medicine

## 2012-06-25 VITALS — BP 138/88 | HR 99 | Temp 98.1°F | Ht 68.0 in | Wt 235.1 lb

## 2012-06-25 DIAGNOSIS — E785 Hyperlipidemia, unspecified: Secondary | ICD-10-CM

## 2012-06-25 DIAGNOSIS — I10 Essential (primary) hypertension: Secondary | ICD-10-CM

## 2012-06-25 DIAGNOSIS — R609 Edema, unspecified: Secondary | ICD-10-CM

## 2012-06-25 MED ORDER — VALSARTAN-HYDROCHLOROTHIAZIDE 160-12.5 MG PO TABS: 1.0000 | ORAL_TABLET | Freq: Every day | ORAL | Status: DC

## 2012-06-25 NOTE — Assessment & Plan Note (Signed)
Improved with better blood pressure.

## 2012-06-25 NOTE — Assessment & Plan Note (Signed)
Mild, decrease simple carbs, add krill oil caps increase exercise

## 2012-06-25 NOTE — Progress Notes (Signed)
Patient ID: Kathleen Church, female   DOB: 1960-04-17, 52 y.o.   MRN: [id number] Kathleen Church [id number] [birth year] 06/25/2012      Progress Note-Follow Up  Subjective  Chief Complaint  Chief Complaint  Patient presents with  . Follow-up    6 week    HPI  Patient is a 52 year old Caucasian female who is in today for followup on her blood pressure. Patient reports she feels much better. She is not experiencing any flushing. No headaches or chest pain. No palpitations shortness of breath GI or GU concerns are noted today. She denies any concerns with increased dose of medication. Does not some ongoing fatigue but no other acute complaints are noted at this time   Past Medical History  Diagnosis Date  . Asthma, mild persistent     adult onset  . Measles as a child  . Mumps as a child  . Anxiety     when father passed  . Hypertension 2010  . Allergic state   . Obesity   . Preventative health care 02/13/2011  . Sinusitis 02/13/2011  . Cervical cancer screening 04/27/2011  . Hypokalemia 04/27/2011  . Edema 04/27/2011  . Hyperlipidemia 04/27/2011    Past Surgical History  Procedure Laterality Date  . Endometrial ablation  2006    menorraghia  . Knee surgery  2006    left knee  . Tonsillectomy    . Tubal ligation      Family History  Problem Relation Age of Onset  . Cancer Father     kidney  . Cancer Maternal Grandmother     bone  . Cancer Paternal Grandmother     breast  . Asthma Daughter   . Heart attack Paternal Grandfather   . Asthma Daughter     History   Social History  . Marital Status: Married    Spouse Name: N/A    Number of Children: N/A  . Years of Education: N/A   Occupational History  . Not on file.   Social History Main Topics  . Smoking status: Never Smoker   . Smokeless tobacco: Never Used  . Alcohol Use: No  . Drug Use: No  . Sexually Active: Yes -- Female partner(s)   Other Topics Concern  . Not on file   Social History Narrative  .  No narrative on file    Current Outpatient Prescriptions on File Prior to Visit  Medication Sig Dispense Refill  . albuterol (PROVENTIL HFA;VENTOLIN HFA) 108 (90 BASE) MCG/ACT inhaler Inhale 2 puffs into the lungs every 6 (six) hours as needed.  1 Inhaler  0  . ALPRAZolam (XANAX) 0.5 MG tablet Take 1 tablet (0.5 mg total) by mouth every 6 (six) hours as needed for sleep or anxiety.  30 tablet  0  . budesonide (PULMICORT) 180 MCG/ACT inhaler Inhale 2 puffs into the lungs 2 (two) times daily.  1 Inhaler  5  . montelukast (SINGULAIR) 10 MG tablet Take 1 tablet (10 mg total) by mouth at bedtime.  90 tablet  1   No current facility-administered medications on file prior to visit.    Allergies  Allergen Reactions  . Codeine Palpitations and Rash    Review of Systems  Review of Systems  Constitutional: Negative for fever and malaise/fatigue.  HENT: Negative for congestion.   Eyes: Negative for discharge.  Respiratory: Negative for shortness of breath.   Cardiovascular: Negative for chest pain, palpitations and leg swelling.  Gastrointestinal: Negative for nausea, abdominal pain  and diarrhea.  Genitourinary: Negative for dysuria.  Musculoskeletal: Negative for falls.  Skin: Negative for rash.  Neurological: Negative for loss of consciousness and headaches.  Endo/Heme/Allergies: Negative for polydipsia.  Psychiatric/Behavioral: Negative for depression and suicidal ideas. The patient is not nervous/anxious and does not have insomnia.     Objective  BP 138/88  Pulse 99  Temp(Src) 98.1 F (36.7 C) (Oral)  Ht 5\' 8"  (1.727 m)  Wt 235 lb 1.3 oz (106.632 kg)  BMI 35.75 kg/m2  SpO2 97%  Physical Exam  Physical Exam  Constitutional: She is oriented to person, place, and time and well-developed, well-nourished, and in no distress. No distress.  HENT:  Head: Normocephalic and atraumatic.  Eyes: Conjunctivae are normal.  Neck: Neck supple. No thyromegaly present.  Cardiovascular:  Normal rate, regular rhythm and normal heart sounds.   No murmur heard. Pulmonary/Chest: Effort normal and breath sounds normal. She has no wheezes.  Abdominal: She exhibits no distension and no mass.  Musculoskeletal: She exhibits no edema.  Lymphadenopathy:    She has no cervical adenopathy.  Neurological: She is alert and oriented to person, place, and time.  Skin: Skin is warm and dry. No rash noted. She is not diaphoretic.  Psychiatric: Memory, affect and judgment normal.    Lab Results  Component Value Date   TSH 1.12 05/03/2012   Lab Results  Component Value Date   WBC 9.3 05/03/2012   HGB 14.6 05/03/2012   HCT 43.0 05/03/2012   MCV 93.4 05/03/2012   PLT 389.0 05/03/2012   Lab Results  Component Value Date   CREATININE 0.6 05/03/2012   BUN 10 05/03/2012   NA 140 05/03/2012   K 3.9 05/03/2012   CL 102 05/03/2012   CO2 29 05/03/2012   Lab Results  Component Value Date   ALT 38* 05/03/2012   AST 28 05/03/2012   ALKPHOS 93 05/03/2012   BILITOT 0.5 05/03/2012   Lab Results  Component Value Date   CHOL 198 05/03/2012   Lab Results  Component Value Date   HDL 31.30* 05/03/2012   Lab Results  Component Value Date   LDLCALC 133* 05/03/2012   Lab Results  Component Value Date   TRIG 170.0* 05/03/2012   Lab Results  Component Value Date   CHOLHDL 6 05/03/2012     Assessment & Plan  Hypertension Improved controlled today, no changes.   Edema Improved with better blood pressure.   Hyperlipidemia Mild, decrease simple carbs, add krill oil caps increase exercise

## 2012-06-25 NOTE — Patient Instructions (Addendum)
Restart krill oil caps. [employer]  Hypertension As your heart beats, it forces blood through your arteries. This force is your blood pressure. If the pressure is too high, it is called hypertension (HTN) or high blood pressure. HTN is dangerous because you may have it and not know it. High blood pressure may mean that your heart has to work harder to pump blood. Your arteries may be narrow or stiff. The extra work puts you at risk for heart disease, stroke, and other problems.  Blood pressure consists of two numbers, a higher number over a lower, 110/72, for example. It is stated as "110 over 72." The ideal is below 120 for the top number (systolic) and under 80 for the bottom (diastolic). Write down your blood pressure today. You should pay close attention to your blood pressure if you have certain conditions such as:  Heart failure.  Prior heart attack.  Diabetes  Chronic kidney disease.  Prior stroke.  Multiple risk factors for heart disease. To see if you have HTN, your blood pressure should be measured while you are seated with your arm held at the level of the heart. It should be measured at least twice. A one-time elevated blood pressure reading (especially in the Emergency Department) does not mean that you need treatment. There may be conditions in which the blood pressure is different between your right and left arms. It is important to see your caregiver soon for a recheck. Most people have essential hypertension which means that there is not a specific cause. This type of high blood pressure may be lowered by changing lifestyle factors such as:  Stress.  Smoking.  Lack of exercise.  Excessive weight.  Drug/tobacco/alcohol use.  Eating less salt. Most people do not have symptoms from high blood pressure until it has caused damage to the body. Effective treatment can often prevent, delay or reduce that damage. TREATMENT  When a cause has been identified, treatment for high  blood pressure is directed at the cause. There are a large number of medications to treat HTN. These fall into several categories, and your caregiver will help you select the medicines that are best for you. Medications may have side effects. You should review side effects with your caregiver. If your blood pressure stays high after you have made lifestyle changes or started on medicines,   Your medication(s) may need to be changed.  Other problems may need to be addressed.  Be certain you understand your prescriptions, and know how and when to take your medicine.  Be sure to follow up with your caregiver within the time frame advised (usually within two weeks) to have your blood pressure rechecked and to review your medications.  If you are taking more than one medicine to lower your blood pressure, make sure you know how and at what times they should be taken. Taking two medicines at the same time can result in blood pressure that is too low. SEEK IMMEDIATE MEDICAL CARE IF:  You develop a severe headache, blurred or changing vision, or confusion.  You have unusual weakness or numbness, or a faint feeling.  You have severe chest or abdominal pain, vomiting, or breathing problems. MAKE SURE YOU:   Understand these instructions.  Will watch your condition.  Will get help right away if you are not doing well or get worse. Document Released: 12/26/2004 Document Revised: 03/20/2011 Document Reviewed: 08/16/2007 [facility] Patient Information 2014 [place], [place].

## 2012-06-25 NOTE — Assessment & Plan Note (Signed)
Improved controlled today, no changes.

## 2012-10-24 ENCOUNTER — Other Ambulatory Visit: Payer: Self-pay

## 2012-10-24 DIAGNOSIS — Z1231 Encounter for screening mammogram for malignant neoplasm of breast: Secondary | ICD-10-CM

## 2012-11-12 ENCOUNTER — Ambulatory Visit
Admission: RE | Admit: 2012-11-12 | Discharge: 2012-11-12 | Disposition: A | Discharge status: Home / Self Care | Payer: 59 | Source: Ambulatory Visit

## 2012-11-12 DIAGNOSIS — Z1231 Encounter for screening mammogram for malignant neoplasm of breast: Secondary | ICD-10-CM

## 2012-11-14 ENCOUNTER — Other Ambulatory Visit: Payer: Self-pay

## 2012-11-28 ENCOUNTER — Other Ambulatory Visit: Payer: Self-pay | Admitting: Family Medicine

## 2012-11-28 NOTE — Telephone Encounter (Signed)
Singulair refill sent to pharmacy. Pt is due for follow up in December. Please call pt to arrange appt.

## 2012-11-28 NOTE — Telephone Encounter (Signed)
Informed patient of medication refill and she scheduled appointment for 01/17/13

## 2013-01-17 ENCOUNTER — Encounter: Payer: Self-pay | Admitting: Family Medicine

## 2013-01-17 ENCOUNTER — Ambulatory Visit (INDEPENDENT_AMBULATORY_CARE_PROVIDER_SITE_OTHER): Payer: 59 | Admitting: Family Medicine

## 2013-01-17 VITALS — BP 148/92 | HR 85 | Temp 98.1°F | Ht 68.0 in | Wt 242.1 lb

## 2013-01-17 DIAGNOSIS — T7840XA Allergy, unspecified, initial encounter: Secondary | ICD-10-CM

## 2013-01-17 DIAGNOSIS — J329 Chronic sinusitis, unspecified: Secondary | ICD-10-CM

## 2013-01-17 DIAGNOSIS — Z23 Encounter for immunization: Secondary | ICD-10-CM

## 2013-01-17 DIAGNOSIS — I10 Essential (primary) hypertension: Secondary | ICD-10-CM

## 2013-01-17 DIAGNOSIS — M722 Plantar fascial fibromatosis: Secondary | ICD-10-CM

## 2013-01-17 DIAGNOSIS — J45909 Unspecified asthma, uncomplicated: Secondary | ICD-10-CM

## 2013-01-17 MED ORDER — MONTELUKAST SODIUM 10 MG PO TABS: 10.0000 mg | ORAL_TABLET | Freq: Every day | ORAL | Status: DC

## 2013-01-17 MED ORDER — VALSARTAN-HYDROCHLOROTHIAZIDE 160-12.5 MG PO TABS: 1.0000 | ORAL_TABLET | Freq: Every day | ORAL | Status: DC

## 2013-01-17 MED ORDER — PNEUMOCOCCAL 13-VAL CONJ VACC IM SUSP: 0.5000 mL | Freq: Once | INTRAMUSCULAR | Status: DC

## 2013-01-17 MED ORDER — ALBUTEROL SULFATE HFA 108 (90 BASE) MCG/ACT IN AERS: 2.0000 | INHALATION_SPRAY | Freq: Four times a day (QID) | RESPIRATORY_TRACT | Status: DC | PRN

## 2013-01-17 MED ORDER — BUDESONIDE 180 MCG/ACT IN AEPB: 2.0000 | INHALATION_SPRAY | Freq: Two times a day (BID) | RESPIRATORY_TRACT | Status: DC

## 2013-01-17 MED ORDER — BUDESONIDE 180 MCG/ACT IN AEPB
2.0000 | INHALATION_SPRAY | Freq: Two times a day (BID) | RESPIRATORY_TRACT | Status: DC
Start: 2013-01-17 — End: 2013-01-17

## 2013-01-17 MED ORDER — CEFDINIR 300 MG PO CAPS: 300.0000 mg | ORAL_CAPSULE | Freq: Two times a day (BID) | ORAL | Status: AC

## 2013-01-17 NOTE — Patient Instructions (Addendum)
Dr Ralene CorkSikora at [facility] Stretch, ice , aspercreme or icy hot, orthotics Probiotic Hydrate Calcium, magnesium and zinc tab  Plantar Fasciitis Plantar fasciitis is a common condition that causes foot pain. It is soreness (inflammation) of the band of tough fibrous tissue on the bottom of the foot that runs from the heel bone (calcaneus) to the ball of the foot. The cause of this soreness may be from excessive standing, poor fitting shoes, running on hard surfaces, being overweight, having an abnormal walk, or overuse (this is common in runners) of the painful foot or feet. It is also common in aerobic exercise dancers and ballet dancers. SYMPTOMS  Most people with plantar fasciitis complain of:  Severe pain in the morning on the bottom of their foot especially when taking the first steps out of bed. This pain recedes after a few minutes of walking.  Severe pain is experienced also during walking following a long period of inactivity.  Pain is worse when walking barefoot or up stairs DIAGNOSIS   Your caregiver will diagnose this condition by examining and feeling your foot.  Special tests such as X-rays of your foot, are usually not needed. PREVENTION   Consult a sports medicine professional before beginning a new exercise program.  Walking programs offer a good workout. With walking there is a lower chance of overuse injuries common to runners. There is less impact and less jarring of the joints.  Begin all new exercise programs slowly. If problems or pain develop, decrease the amount of time or distance until you are at a comfortable level.  Wear good shoes and replace them regularly.  Stretch your foot and the heel cords at the back of the ankle (Achilles tendon) both before and after exercise.  Run or exercise on even surfaces that are not hard. For example, asphalt is better than pavement.  Do not run barefoot on hard surfaces.  If using a treadmill, vary the incline.  Do not  continue to workout if you have foot or joint problems. Seek professional help if they do not improve. HOME CARE INSTRUCTIONS   Avoid activities that cause you pain until you recover.  Use ice or cold packs on the problem or painful areas after working out.  Only take over-the-counter or prescription medicines for pain, discomfort, or fever as directed by your caregiver.  Soft shoe inserts or athletic shoes with air or gel sole cushions may be helpful.  If problems continue or become more severe, consult a sports medicine caregiver or your own health care provider. Cortisone is a potent anti-inflammatory medication that may be injected into the painful area. You can discuss this treatment with your caregiver. MAKE SURE YOU:   Understand these instructions.  Will watch your condition.  Will get help right away if you are not doing well or get worse. Document Released: 09/20/2000 Document Revised: 03/20/2011 Document Reviewed: 11/20/2007 [facility] Patient Information 2014 [place]

## 2013-01-17 NOTE — Progress Notes (Signed)
Pre visit review using our clinic review tool, if applicable. No additional management support is needed unless otherwise documented below in the visit note. 

## 2013-01-19 ENCOUNTER — Encounter: Payer: Self-pay | Admitting: Family Medicine

## 2013-01-19 DIAGNOSIS — M722 Plantar fascial fibromatosis: Secondary | ICD-10-CM

## 2013-01-19 DIAGNOSIS — J329 Chronic sinusitis, unspecified: Secondary | ICD-10-CM | POA: Insufficient documentation

## 2013-01-19 HISTORY — DX: Plantar fascial fibromatosis: M72.2

## 2013-01-19 NOTE — Progress Notes (Signed)
Patient ID: Kathleen Church, female   DOB: 01/23/1960, 53 y.o.   MRN: [id number] Kathleen Church [id number] 05/24/1960 01/19/2013      Progress Note-Follow Up  Subjective  Chief Complaint  Chief Complaint  Patient presents with  . Medication Refill  . Injections    pneumonia    HPI  Patient is a 53 year old Caucasian female who followup she is struggling with respiratory symptoms. Has had increased head congestion and facial pressure for about a week. Complaint left ear pain and a low-grade cough cough is generally nonproductive no fevers or chills but some malaise and myalgias are noted. She is also complaining of some left heel pain for several months now worse upon first arising. No injury or trauma. Is in need of refills on multiple medications including her Singulair and albuterol. No chest pain or palpitations. No chest congestion or shortness of breath. No GI or GU concerns.  Past Medical History  Diagnosis Date  . Asthma, mild persistent     adult onset  . Measles as a child  . Mumps as a child  . Anxiety     when father passed  . Hypertension 2010  . Allergic state   . Obesity   . Preventative health care 02/13/2011  . Sinusitis 02/13/2011  . Cervical cancer screening 04/27/2011  . Hypokalemia 04/27/2011  . Edema 04/27/2011  . Hyperlipidemia 04/27/2011  . Plantar fasciitis, left 01/19/2013    Past Surgical History  Procedure Laterality Date  . Endometrial ablation  2006    menorraghia  . Knee surgery  2006    left knee  . Tonsillectomy    . Tubal ligation      Family History  Problem Relation Age of Onset  . Cancer Father     kidney  . Cancer Maternal Grandmother     bone  . Cancer Paternal Grandmother     breast  . Asthma Daughter   . Heart attack Paternal Grandfather   . Asthma Daughter     History   Social History  . Marital Status: Married    Spouse Name: N/A    Number of Children: N/A  . Years of Education: N/A   Occupational History  . Not on  file.   Social History Main Topics  . Smoking status: Never Smoker   . Smokeless tobacco: Never Used  . Alcohol Use: No  . Drug Use: No  . Sexual Activity: Yes    Partners: Male   Other Topics Concern  . Not on file   Social History Narrative  . No narrative on file    Current Outpatient Prescriptions on File Prior to Visit  Medication Sig Dispense Refill  . ALPRAZolam (XANAX) 0.5 MG tablet Take 1 tablet (0.5 mg total) by mouth every 6 (six) hours as needed for sleep or anxiety.  30 tablet  0   No current facility-administered medications on file prior to visit.    Allergies  Allergen Reactions  . Codeine Palpitations and Rash    Review of Systems  Review of Systems  Constitutional: Positive for malaise/fatigue. Negative for fever.  HENT: Positive for congestion and ear pain.   Eyes: Negative for discharge.  Respiratory: Positive for cough and sputum production. Negative for shortness of breath.   Cardiovascular: Negative for chest pain, palpitations and leg swelling.  Gastrointestinal: Negative for nausea, abdominal pain and diarrhea.  Genitourinary: Negative for dysuria.  Musculoskeletal: Positive for joint pain. Negative for falls.  Left heel pain  Skin: Negative for rash.  Neurological: Negative for loss of consciousness and headaches.  Endo/Heme/Allergies: Negative for polydipsia.  Psychiatric/Behavioral: Negative for depression and suicidal ideas. The patient is not nervous/anxious and does not have insomnia.     Objective  BP 148/92  Pulse 85  Temp(Src) 98.1 F (36.7 C) (Oral)  Ht 5\' 8"  (1.727 m)  Wt 242 lb 1.3 oz (109.807 kg)  BMI 36.82 kg/m2  SpO2 97%  Physical Exam  Physical Exam  Constitutional: She is oriented to person, place, and time and well-developed, well-nourished, and in no distress. No distress.  HENT:  Head: Normocephalic and atraumatic.  Eyes: Conjunctivae are normal.  Neck: Neck supple. No thyromegaly present.   Cardiovascular: Normal rate, regular rhythm and normal heart sounds.   No murmur heard. Pulmonary/Chest: Effort normal and breath sounds normal. She has no wheezes.  Abdominal: She exhibits no distension and no mass.  Musculoskeletal: She exhibits tenderness. She exhibits no edema.  Pain with palp left plantar heel  Lymphadenopathy:    She has no cervical adenopathy.  Neurological: She is alert and oriented to person, place, and time.  Skin: Skin is warm and dry. No rash noted. She is not diaphoretic.  Psychiatric: Memory, affect and judgment normal.    Lab Results  Component Value Date   TSH 1.12 05/03/2012   Lab Results  Component Value Date   WBC 9.3 05/03/2012   HGB 14.6 05/03/2012   HCT 43.0 05/03/2012   MCV 93.4 05/03/2012   PLT 389.0 05/03/2012   Lab Results  Component Value Date   CREATININE 0.6 05/03/2012   BUN 10 05/03/2012   NA 140 05/03/2012   K 3.9 05/03/2012   CL 102 05/03/2012   CO2 29 05/03/2012   Lab Results  Component Value Date   ALT 38* 05/03/2012   AST 28 05/03/2012   ALKPHOS 93 05/03/2012   BILITOT 0.5 05/03/2012   Lab Results  Component Value Date   CHOL 198 05/03/2012   Lab Results  Component Value Date   HDL 31.30* 05/03/2012   Lab Results  Component Value Date   LDLCALC 133* 05/03/2012   Lab Results  Component Value Date   TRIG 170.0* 05/03/2012   Lab Results  Component Value Date   CHOLHDL 6 05/03/2012     Assessment & Plan  Hypertension Mild elevation encouraged DASH diet and regular exercise  Sinusitis Cefdinir, mucinex and probiotics  Plantar fasciitis, left Instructed on stretching, ice, good foot wear, inserts and Aspercreme if no improvement consider podiatry

## 2013-01-19 NOTE — Assessment & Plan Note (Signed)
Instructed on stretching, ice, good foot wear, inserts and Aspercreme if no improvement consider podiatry

## 2013-01-19 NOTE — Assessment & Plan Note (Signed)
Mild elevation encouraged DASH diet and regular exercise

## 2013-01-19 NOTE — Assessment & Plan Note (Signed)
Cefdinir, mucinex and probiotics

## 2013-01-24 ENCOUNTER — Ambulatory Visit: Payer: 59 | Admitting: Family Medicine

## 2013-02-12 ENCOUNTER — Telehealth: Payer: Self-pay

## 2013-02-12 ENCOUNTER — Encounter: Payer: Self-pay | Admitting: Physician Assistant

## 2013-02-12 NOTE — Telephone Encounter (Signed)
Patient was recently seen by Dr. Abner GreenspanBlyth for this issue.  She is on vacation, but I will happily write the note for her.  She can pick it up at the front desk.

## 2013-02-12 NOTE — Telephone Encounter (Signed)
Pt left a message stating that she would like a note for work stating she has plantar fascitis and it would help to wear tennis shoes. Pt states she is supposed to wear dress shoes at work.  Please advise?

## 2013-03-04 ENCOUNTER — Ambulatory Visit: Payer: Self-pay | Admitting: Podiatry

## 2013-03-20 ENCOUNTER — Ambulatory Visit (INDEPENDENT_AMBULATORY_CARE_PROVIDER_SITE_OTHER): Payer: 59 | Admitting: Podiatry

## 2013-03-20 ENCOUNTER — Ambulatory Visit (INDEPENDENT_AMBULATORY_CARE_PROVIDER_SITE_OTHER): Payer: 59

## 2013-03-20 ENCOUNTER — Encounter: Payer: Self-pay | Admitting: Podiatry

## 2013-03-20 VITALS — BP 152/94 | HR 94 | Resp 16 | Ht 69.0 in | Wt 200.0 lb

## 2013-03-20 DIAGNOSIS — M722 Plantar fascial fibromatosis: Secondary | ICD-10-CM

## 2013-03-20 MED ORDER — METHYLPREDNISOLONE (PAK) 4 MG PO TABS: ORAL_TABLET | ORAL | Status: DC

## 2013-03-20 MED ORDER — MELOXICAM 15 MG PO TABS: 15.0000 mg | ORAL_TABLET | Freq: Every day | ORAL | Status: DC

## 2013-03-20 NOTE — Progress Notes (Signed)
   Subjective:    Patient ID: Kathleen LericheKimberly K Church, female    DOB: 07/27/1960, 53 y.o.   MRN: [id number]  HPI Comments: i have been having heel pain in my left foot, it throbs it burns, it sharp. Its been going on since august. After activity it seems to be worse the next day. Left plantar to back of the heel. Been using ice. Icy hot. Motrin by mouth , some stretching exercises .  Foot Pain      Review of Systems  All other systems reviewed and are negative.       Objective:   Physical Exam: I have reviewed her past medical history medications allergies surgeries and social history. Vital signs are stable she is alert and oriented x3. Pulses are strongly palpable left foot. Neurologic sensorium is intact per Semmes-Weinstein monofilament. Deep tendon reflexes are brisk and equal bilateral. Muscle strength is 5 over 5 dorsiflexors plantar flexors inverters everters all intrinsic musculature is intact. Orthopedic evaluation demonstrates pain on palpation medial continued tubercle of the left heel she also has pain on palpation of the posterior aspect of the left heel at the tendo Achilles insertion site. There is some mild soft tissue bogginess overlying the posterior aspect of the left heel. Radiographic evaluation does demonstrate soft tissue increase in density at the plantar fascial calcaneal insertion site as well as the tendo Achilles at its insertion site.        Assessment & Plan:  Assessment: Plantar fasciitis and Achilles tendinitis with bursitis left heel.  Plan: Discussed etiology pathology conservative versus surgical therapies. I injected the left plantar fascial site today. This was with Kenalog and local anesthetic. Put her in a plantar fascial brace. She was also dispensed a night splint. She was dispensed both oral and written home-going instructions for stretching and the care of her plantar fasciitis and Achilles tendinitis. She was also prescribed a Medrol Dosepak to be  followed by my big. We discussed appropriate shoe gear stretching exercises ice therapy shoe gear modifications. We'll followup with her one month.

## 2013-03-20 NOTE — Patient Instructions (Signed)

## 2013-04-17 ENCOUNTER — Encounter: Payer: Self-pay | Admitting: Podiatry

## 2013-04-17 ENCOUNTER — Ambulatory Visit (INDEPENDENT_AMBULATORY_CARE_PROVIDER_SITE_OTHER): Payer: 59 | Admitting: Podiatry

## 2013-04-17 VITALS — BP 125/86 | HR 100 | Resp 16

## 2013-04-17 DIAGNOSIS — M722 Plantar fascial fibromatosis: Secondary | ICD-10-CM

## 2013-04-17 NOTE — Progress Notes (Signed)
She presents today for followup of her plantar fasciitis. She states is doing just perfect she says is proximally 90% better.  Objective: Vital signs are stable she is alert and oriented x3. Pulses are strongly palpable. Minimal pain on palpation medial continued tubercles.  Assessment: Well-healing plantar fasciitis.  Plan: Discussed the etiology pathology conservative versus surgical therapies at this point I did suggest that we continue all conservative therapies and followup with me should she start to regress.

## 2013-05-20 ENCOUNTER — Telehealth: Payer: Self-pay | Admitting: Family Medicine

## 2013-05-20 NOTE — Telephone Encounter (Signed)
Patient never picked up letter from 02/12/13 °

## 2013-06-03 ENCOUNTER — Telehealth: Payer: Self-pay

## 2013-06-03 NOTE — Telephone Encounter (Signed)
So I could meet her early on Thursday. Up her Xanax if she is agreeable

## 2013-06-03 NOTE — Telephone Encounter (Signed)
Pt left a message on Friday at 4:30 pm stating that her husband passed away on 06/01/22 2013-06-19 and her pulse is real high? Pt stated in message that she knows this is anxiety and she is taking her Xanax but is wandering if MD wants to see her?  Please advise?  Pt also stated in message that she would like MD to call her before she left on Friday

## 2013-06-03 NOTE — Telephone Encounter (Signed)
Patient informed and states she will come in at 7:15 am on Thursday. Pt states she is ok on the xanax until then

## 2013-06-05 ENCOUNTER — Other Ambulatory Visit: Payer: Self-pay | Admitting: Family Medicine

## 2013-06-05 ENCOUNTER — Ambulatory Visit (INDEPENDENT_AMBULATORY_CARE_PROVIDER_SITE_OTHER): Payer: 59 | Admitting: Family Medicine

## 2013-06-05 ENCOUNTER — Encounter: Payer: Self-pay | Admitting: Family Medicine

## 2013-06-05 ENCOUNTER — Telehealth: Payer: Self-pay

## 2013-06-05 VITALS — BP 142/90 | HR 97 | Temp 98.1°F | Ht 68.0 in | Wt 233.0 lb

## 2013-06-05 DIAGNOSIS — E785 Hyperlipidemia, unspecified: Secondary | ICD-10-CM

## 2013-06-05 DIAGNOSIS — H60399 Other infective otitis externa, unspecified ear: Secondary | ICD-10-CM

## 2013-06-05 DIAGNOSIS — I10 Essential (primary) hypertension: Secondary | ICD-10-CM

## 2013-06-05 DIAGNOSIS — F4321 Adjustment disorder with depressed mood: Secondary | ICD-10-CM

## 2013-06-05 DIAGNOSIS — E669 Obesity, unspecified: Secondary | ICD-10-CM

## 2013-06-05 DIAGNOSIS — R000 Tachycardia, unspecified: Secondary | ICD-10-CM

## 2013-06-05 MED ORDER — METOPROLOL SUCCINATE ER 25 MG PO TB24: 25.0000 mg | ORAL_TABLET | Freq: Every day | ORAL | Status: DC

## 2013-06-05 MED ORDER — NEOMYCIN-POLYMYXIN-HC 3.5-10000-1 OT SOLN: 3.0000 [drp] | Freq: Three times a day (TID) | OTIC | Status: DC

## 2013-06-05 MED ORDER — ALPRAZOLAM 1 MG PO TABS: ORAL_TABLET | ORAL | Status: DC

## 2013-06-05 NOTE — Patient Instructions (Signed)
Grief Reaction  Grief is a normal response to the death of someone close to you. Feelings of fear, anger, and guilt can affect almost everyone who loses someone they love. Symptoms of depression are also common. These include problems with sleep, loss of appetite, and lack of energy. These grief reaction symptoms often last for weeks to months after a loss. They may also return during special times that remind you of the person you lost, such as an anniversary or birthday.  Anxiety, insomnia, irritability, and deep depression may last beyond the period of normal grief. If you experience these feelings for 6 months or longer, you may have clinical depression. Clinical depression requires further medical attention. If you think that you have clinical depression, you should contact your caregiver. If you have a history of depression and or a family history of depression, you are at greater risk of clinical depression. You are also at greater risk of developing clinical depression if the loss was traumatic or the loss was of someone with whom you had unresolved issues.   A grief reaction can become complicated by being blocked. This means being unable to cry or express extreme emotions. This may prolong the grieving period and worsen the emotional effects of the loss. Mourning is a natural event in human life. A healthy grief reaction is one that is not blocked . It requires a time of sadness and readjustment.It is very important to share your sorrow and fear with others, especially close friends and family. Professional counselors and clergy can also help you process your grief.  Document Released: 12/26/2004 Document Revised: 03/20/2011 Document Reviewed: 09/05/2005  [employer] Patient Information 2014 [employer]

## 2013-06-05 NOTE — Telephone Encounter (Signed)
Pt left a message stating that MD was going to call in some ear drops for her?  Please advise?

## 2013-06-05 NOTE — Progress Notes (Signed)
Pre visit review using our clinic review tool, if applicable. No additional management support is needed unless otherwise documented below in the visit note. 

## 2013-06-06 NOTE — Telephone Encounter (Signed)
Did you see this? 

## 2013-06-06 NOTE — Telephone Encounter (Signed)
Dr B did this

## 2013-06-08 ENCOUNTER — Encounter: Payer: Self-pay | Admitting: Family Medicine

## 2013-06-08 DIAGNOSIS — R000 Tachycardia, unspecified: Secondary | ICD-10-CM | POA: Insufficient documentation

## 2013-06-08 DIAGNOSIS — F4320 Adjustment disorder, unspecified: Secondary | ICD-10-CM | POA: Insufficient documentation

## 2013-06-08 DIAGNOSIS — F4321 Adjustment disorder with depressed mood: Secondary | ICD-10-CM | POA: Insufficient documentation

## 2013-06-08 NOTE — Progress Notes (Signed)
Patient ID: Kathleen Church, female   DOB: [birth year], 53 y.o.   MRN: [id number] BEATRIZE KERSCHEN [id number] 07/16/1960 06/08/2013      Progress Note-Follow Up  Subjective  Chief Complaint  Chief Complaint  Patient presents with  . spouse passed    06/12/2013    HPI  Patient is a 53 year old female in today for routine medical care. Her husband died suddenly in their home this week. Has been using alprazolam when necessary but it is only marginally helpful. She is tearful and having trouble concentrating. Trouble sleeping. She has episodes of palpitations. She cries frequently. He died on 06/13/2022. Denies CP/SOB/HA/congestion/fevers/GI or GU c/o. Taking meds as prescribed  Past Medical History  Diagnosis Date  . Asthma, mild persistent     adult onset  . Measles as a child  . Mumps as a child  . Anxiety     when father passed  . Hypertension 2010  . Allergic state   . Obesity   . Preventative health care 02/13/2011  . Sinusitis 02/13/2011  . Cervical cancer screening 04/27/2011  . Hypokalemia 04/27/2011  . Edema 04/27/2011  . Hyperlipidemia 04/27/2011  . Plantar fasciitis, left 01/19/2013    Past Surgical History  Procedure Laterality Date  . Endometrial ablation  2006    menorraghia  . Knee surgery  2006    left knee  . Tonsillectomy    . Tubal ligation      Family History  Problem Relation Age of Onset  . Cancer Father     kidney  . Cancer Maternal Grandmother     bone  . Cancer Paternal Grandmother     breast  . Asthma Daughter   . Heart attack Paternal Grandfather   . Asthma Daughter     History   Social History  . Marital Status: Married    Spouse Name: N/A    Number of Children: N/A  . Years of Education: N/A   Occupational History  . Not on file.   Social History Main Topics  . Smoking status: Never Smoker   . Smokeless tobacco: Never Used  . Alcohol Use: No  . Drug Use: No  . Sexual Activity: Yes    Partners: Male   Other Topics Concern  . Not  on file   Social History Narrative  . No narrative on file    Current Outpatient Prescriptions on File Prior to Visit  Medication Sig Dispense Refill  . albuterol (PROVENTIL HFA;VENTOLIN HFA) 108 (90 BASE) MCG/ACT inhaler Inhale 2 puffs into the lungs every 6 (six) hours as needed.  3 Inhaler  1  . aspirin 81 MG tablet Take 81 mg by mouth daily.      . budesonide (PULMICORT) 180 MCG/ACT inhaler Inhale 2 puffs into the lungs 2 (two) times daily.  3 Inhaler  1  . montelukast (SINGULAIR) 10 MG tablet Take 1 tablet (10 mg total) by mouth at bedtime.  90 tablet  1  . valsartan-hydrochlorothiazide (DIOVAN HCT) 160-12.5 MG per tablet Take 1 tablet by mouth daily.  90 tablet  2  . meloxicam (MOBIC) 15 MG tablet Take 1 tablet (15 mg total) by mouth daily.  30 tablet  3   No current facility-administered medications on file prior to visit.    Allergies  Allergen Reactions  . Codeine Palpitations and Rash    Review of Systems  Review of Systems  Constitutional: Negative for fever and malaise/fatigue.  HENT: Negative for congestion.  Eyes: Negative for discharge.  Respiratory: Negative for shortness of breath.   Cardiovascular: Negative for chest pain, palpitations and leg swelling.  Gastrointestinal: Negative for nausea, abdominal pain and diarrhea.  Genitourinary: Negative for dysuria.  Musculoskeletal: Negative for falls.  Skin: Negative for rash.  Neurological: Negative for loss of consciousness and headaches.  Endo/Heme/Allergies: Negative for polydipsia.  Psychiatric/Behavioral: Positive for depression. Negative for suicidal ideas. The patient is nervous/anxious and has insomnia.     Objective  BP 142/90  Pulse 97  Temp(Src) 98.1 F (36.7 C) (Oral)  Ht 5\' 8"  (1.727 m)  Wt 233 lb (105.688 kg)  BMI 35.44 kg/m2  SpO2 96%  Physical Exam  Physical Exam  Constitutional: She is oriented to person, place, and time and well-developed, well-nourished, and in no distress. No  distress.  HENT:  Head: Normocephalic and atraumatic.  Eyes: Conjunctivae are normal.  Neck: Neck supple. No thyromegaly present.  Cardiovascular: Normal rate, regular rhythm and normal heart sounds.   No murmur heard. Pulmonary/Chest: Effort normal and breath sounds normal. She has no wheezes.  Abdominal: She exhibits no distension and no mass.  Musculoskeletal: She exhibits no edema.  Lymphadenopathy:    She has no cervical adenopathy.  Neurological: She is alert and oriented to person, place, and time.  Skin: Skin is warm and dry. No rash noted. She is not diaphoretic.  Psychiatric: Memory, affect and judgment normal.    Lab Results  Component Value Date   TSH 1.12 05/03/2012   Lab Results  Component Value Date   WBC 9.3 05/03/2012   HGB 14.6 05/03/2012   HCT 43.0 05/03/2012   MCV 93.4 05/03/2012   PLT 389.0 05/03/2012   Lab Results  Component Value Date   CREATININE 0.6 05/03/2012   BUN 10 05/03/2012   NA 140 05/03/2012   K 3.9 05/03/2012   CL 102 05/03/2012   CO2 29 05/03/2012   Lab Results  Component Value Date   ALT 38* 05/03/2012   AST 28 05/03/2012   ALKPHOS 93 05/03/2012   BILITOT 0.5 05/03/2012   Lab Results  Component Value Date   CHOL 198 05/03/2012   Lab Results  Component Value Date   HDL 31.30* 05/03/2012   Lab Results  Component Value Date   LDLCALC 133* 05/03/2012   Lab Results  Component Value Date   TRIG 170.0* 05/03/2012   Lab Results  Component Value Date   CHOLHDL 6 05/03/2012     Assessment & Plan  Hypertension Well controlled, no changes to meds. Encouraged heart healthy diet such as the DASH diet and exercise as tolerated.   Hyperlipidemia Encouraged heart healthy diet, increase exercise, avoid trans fats, consider a krill oil cap daily  Obesity Encouraged DASH diet, decrease po intake and increase exercise as tolerated. Needs 7-8 hours of sleep nightly. Avoid trans fats, eat small, frequent meals every 4-5 hours with lean proteins,  complex carbs and healthy fats. Minimize simple carbs, GMO foods.  Tachycardia Mild, secondary to stress  Grief reaction Husband just died in their home she is very tearful . Given a refill on Alprazolam, discussed SSRI but would like to se ehow she manages the next couple of weeks.

## 2013-06-08 NOTE — Assessment & Plan Note (Signed)
Encouraged DASH diet, decrease po intake and increase exercise as tolerated. Needs 7-8 hours of sleep nightly. Avoid trans fats, eat small, frequent meals every 4-5 hours with lean proteins, complex carbs and healthy fats. Minimize simple carbs, GMO foods. 

## 2013-06-08 NOTE — Assessment & Plan Note (Signed)
Husband just died in their home she is very tearful . Given a refill on Alprazolam, discussed SSRI but would like to se ehow she manages the next couple of weeks.

## 2013-06-08 NOTE — Assessment & Plan Note (Signed)
Encouraged heart healthy diet, increase exercise, avoid trans fats, consider a krill oil cap daily 

## 2013-06-08 NOTE — Assessment & Plan Note (Signed)
Well controlled, no changes to meds. Encouraged heart healthy diet such as the DASH diet and exercise as tolerated.  °

## 2013-06-08 NOTE — Assessment & Plan Note (Signed)
Mild, secondary to stress

## 2013-06-25 ENCOUNTER — Telehealth: Payer: Self-pay | Admitting: Family Medicine

## 2013-06-25 ENCOUNTER — Encounter: Payer: Self-pay | Admitting: Family Medicine

## 2013-06-25 ENCOUNTER — Ambulatory Visit (INDEPENDENT_AMBULATORY_CARE_PROVIDER_SITE_OTHER): Payer: 59 | Admitting: Family Medicine

## 2013-06-25 VITALS — BP 132/84 | HR 90 | Temp 97.7°F | Ht 68.0 in | Wt 234.0 lb

## 2013-06-25 DIAGNOSIS — Z5189 Encounter for other specified aftercare: Secondary | ICD-10-CM

## 2013-06-25 DIAGNOSIS — E785 Hyperlipidemia, unspecified: Secondary | ICD-10-CM

## 2013-06-25 DIAGNOSIS — I10 Essential (primary) hypertension: Secondary | ICD-10-CM

## 2013-06-25 DIAGNOSIS — F411 Generalized anxiety disorder: Secondary | ICD-10-CM

## 2013-06-25 DIAGNOSIS — F419 Anxiety disorder, unspecified: Secondary | ICD-10-CM

## 2013-06-25 DIAGNOSIS — R000 Tachycardia, unspecified: Secondary | ICD-10-CM

## 2013-06-25 DIAGNOSIS — T7840XD Allergy, unspecified, subsequent encounter: Secondary | ICD-10-CM

## 2013-06-25 DIAGNOSIS — E669 Obesity, unspecified: Secondary | ICD-10-CM

## 2013-06-25 DIAGNOSIS — F4321 Adjustment disorder with depressed mood: Secondary | ICD-10-CM

## 2013-06-25 NOTE — Assessment & Plan Note (Signed)
Well controlled, no changes to meds. Encouraged heart healthy diet such as the DASH diet and exercise as tolerated.  °

## 2013-06-25 NOTE — Assessment & Plan Note (Signed)
Is managing with just occasional Alprazolam use with good results does not need a daily SSRI will cal lif changes. Given paper work on behavioral is she needs it.

## 2013-06-25 NOTE — Assessment & Plan Note (Signed)
rrr today 

## 2013-06-25 NOTE — Progress Notes (Signed)
Patient ID: Bary LericheKimberly K Dettloff, female   DOB: 01/18/1960, 53 y.o.   MRN: [id number] Bary LericheKimberly K Vangilder [id number] 08/31/1960 06/25/2013      Progress Note-Follow Up  Subjective  Chief Complaint  Chief Complaint  Patient presents with  . Follow-up    3 week    HPI  Patient is a 53 year old female in today for routine medical care. She is in today for followup. She is generally feeling well. No recent illness. Feeling better than she did her last visit. Denies CP/palp/SOB/HA/congestion/fevers/GI or GU c/o. Taking meds as prescribed  Past Medical History  Diagnosis Date  . Asthma, mild persistent     adult onset  . Measles as a child  . Mumps as a child  . Anxiety     when father passed  . Hypertension 2010  . Allergic state   . Obesity   . Preventative health care 02/13/2011  . Sinusitis 02/13/2011  . Cervical cancer screening 04/27/2011  . Hypokalemia 04/27/2011  . Edema 04/27/2011  . Hyperlipidemia 04/27/2011  . Plantar fasciitis, left 01/19/2013    Past Surgical History  Procedure Laterality Date  . Endometrial ablation  2006    menorraghia  . Knee surgery  2006    left knee  . Tonsillectomy    . Tubal ligation      Family History  Problem Relation Age of Onset  . Cancer Father     kidney  . Cancer Maternal Grandmother     bone  . Cancer Paternal Grandmother     breast  . Asthma Daughter   . Heart attack Paternal Grandfather   . Asthma Daughter     History   Social History  . Marital Status: Married    Spouse Name: N/A    Number of Children: N/A  . Years of Education: N/A   Occupational History  . Not on file.   Social History Main Topics  . Smoking status: Never Smoker   . Smokeless tobacco: Never Used  . Alcohol Use: No  . Drug Use: No  . Sexual Activity: Yes    Partners: Male   Other Topics Concern  . Not on file   Social History Narrative  . No narrative on file    Current Outpatient Prescriptions on File Prior to Visit  Medication Sig  Dispense Refill  . albuterol (PROVENTIL HFA;VENTOLIN HFA) 108 (90 BASE) MCG/ACT inhaler Inhale 2 puffs into the lungs every 6 (six) hours as needed.  3 Inhaler  1  . ALPRAZolam (XANAX) 1 MG tablet 1/2 to 1 tab po bid prn anxiety, palpitations  60 tablet  1  . aspirin 81 MG tablet Take 81 mg by mouth daily.      . budesonide (PULMICORT) 180 MCG/ACT inhaler Inhale 2 puffs into the lungs 2 (two) times daily.  3 Inhaler  1  . meloxicam (MOBIC) 15 MG tablet Take 1 tablet (15 mg total) by mouth daily.  30 tablet  3  . metoprolol succinate (TOPROL-XL) 25 MG 24 hr tablet Take 1 tablet (25 mg total) by mouth daily.  30 tablet  3  . montelukast (SINGULAIR) 10 MG tablet Take 1 tablet (10 mg total) by mouth at bedtime.  90 tablet  1  . valsartan-hydrochlorothiazide (DIOVAN HCT) 160-12.5 MG per tablet Take 1 tablet by mouth daily.  90 tablet  2   No current facility-administered medications on file prior to visit.    Allergies  Allergen Reactions  . Codeine Palpitations and  Rash    Review of Systems  Review of Systems  Constitutional: Negative for fever and malaise/fatigue.  HENT: Negative for congestion.   Eyes: Negative for discharge.  Respiratory: Negative for shortness of breath.   Cardiovascular: Negative for chest pain, palpitations and leg swelling.  Gastrointestinal: Negative for nausea, abdominal pain and diarrhea.  Genitourinary: Negative for dysuria.  Musculoskeletal: Negative for falls.  Skin: Negative for rash.  Neurological: Negative for loss of consciousness and headaches.  Endo/Heme/Allergies: Negative for polydipsia.  Psychiatric/Behavioral: Positive for depression. Negative for suicidal ideas. The patient is not nervous/anxious and does not have insomnia.     Objective  BP 132/84  Pulse 90  Temp(Src) 97.7 F (36.5 C) (Oral)  Ht 5\' 8"  (1.727 m)  Wt 234 lb (106.142 kg)  BMI 35.59 kg/m2  SpO2 95%  Physical Exam  Physical Exam  Constitutional: She is oriented to  person, place, and time and well-developed, well-nourished, and in no distress. No distress.  HENT:  Head: Normocephalic and atraumatic.  Eyes: Conjunctivae are normal.  Neck: Neck supple. No thyromegaly present.  Cardiovascular: Normal rate, regular rhythm and normal heart sounds.   No murmur heard. Pulmonary/Chest: Effort normal and breath sounds normal. She has no wheezes.  Abdominal: She exhibits no distension and no mass.  Musculoskeletal: She exhibits no edema.  Lymphadenopathy:    She has no cervical adenopathy.  Neurological: She is alert and oriented to person, place, and time.  Skin: Skin is warm and dry. No rash noted. She is not diaphoretic.  Psychiatric: Memory, affect and judgment normal.    Lab Results  Component Value Date   TSH 1.12 05/03/2012   Lab Results  Component Value Date   WBC 9.3 05/03/2012   HGB 14.6 05/03/2012   HCT 43.0 05/03/2012   MCV 93.4 05/03/2012   PLT 389.0 05/03/2012   Lab Results  Component Value Date   CREATININE 0.6 05/03/2012   BUN 10 05/03/2012   NA 140 05/03/2012   K 3.9 05/03/2012   CL 102 05/03/2012   CO2 29 05/03/2012   Lab Results  Component Value Date   ALT 38* 05/03/2012   AST 28 05/03/2012   ALKPHOS 93 05/03/2012   BILITOT 0.5 05/03/2012   Lab Results  Component Value Date   CHOL 198 05/03/2012   Lab Results  Component Value Date   HDL 31.30* 05/03/2012   Lab Results  Component Value Date   LDLCALC 133* 05/03/2012   Lab Results  Component Value Date   TRIG 170.0* 05/03/2012   Lab Results  Component Value Date   CHOLHDL 6 05/03/2012     Assessment & Plan  Hypertension Well controlled, no changes to meds. Encouraged heart healthy diet such as the DASH diet and exercise as tolerated.   Tachycardia rrr today  Grief reaction Is managing with just occasional Alprazolam use with good results does not need a daily SSRI will cal lif changes. Given paper work on behavioral is she needs it.  Obesity Encouraged DASH diet,  decrease po intake and increase exercise as tolerated. Needs 7-8 hours of sleep nightly. Avoid trans fats, eat small, frequent meals every 4-5 hours with lean proteins, complex carbs and healthy fats. Minimize simple carbs, GMO foods.  Allergic state Well controlled on current meds  Anxiety Using Alprazolam intermittently with good results  Hyperlipidemia Encouraged heart healthy diet, increase exercise, avoid trans fats, consider a krill oil cap daily

## 2013-06-25 NOTE — Progress Notes (Signed)
Pre visit review using our clinic review tool, if applicable. No additional management support is needed unless otherwise documented below in the visit note. 

## 2013-06-25 NOTE — Telephone Encounter (Signed)
Relevant patient education assigned to patient using Emmi. ° °

## 2013-07-03 NOTE — Assessment & Plan Note (Signed)
Using Alprazolam intermittently with good results

## 2013-07-03 NOTE — Assessment & Plan Note (Signed)
Encouraged heart healthy diet, increase exercise, avoid trans fats, consider a krill oil cap daily 

## 2013-07-03 NOTE — Assessment & Plan Note (Signed)
Encouraged DASH diet, decrease po intake and increase exercise as tolerated. Needs 7-8 hours of sleep nightly. Avoid trans fats, eat small, frequent meals every 4-5 hours with lean proteins, complex carbs and healthy fats. Minimize simple carbs, GMO foods. 

## 2013-07-03 NOTE — Assessment & Plan Note (Signed)
Well-controlled on current meds 

## 2013-07-16 ENCOUNTER — Other Ambulatory Visit: Payer: Self-pay | Admitting: Podiatry

## 2013-07-29 ENCOUNTER — Encounter: Payer: Self-pay | Admitting: Podiatry

## 2013-07-29 ENCOUNTER — Ambulatory Visit (INDEPENDENT_AMBULATORY_CARE_PROVIDER_SITE_OTHER): Payer: 59 | Admitting: Podiatry

## 2013-07-29 VITALS — BP 132/78 | HR 88 | Resp 16

## 2013-07-29 DIAGNOSIS — M722 Plantar fascial fibromatosis: Secondary | ICD-10-CM

## 2013-07-29 DIAGNOSIS — M7660 Achilles tendinitis, unspecified leg: Secondary | ICD-10-CM

## 2013-07-29 NOTE — Progress Notes (Signed)
She presents today for followup of her plantar fasciitis. She states that that is doing much better however have pain right here she points to the posterior aspect of her left heel. She states that I may have overdone working in the are moving mulch.  Objective: Vital signs are stable she is alert and oriented x3. She has no pain on palpation medial continued tubercle of the left heel. She does have pain on palpation with overlying erythema at the insertion site of the tendo Achilles of the left heel. Palpable nonpulsatile nodule indicative of spur.  Assessment: Retrocalcaneal heel spur with insertional Achilles tendinitis left. Some resolution of her plantar fasciitis left.  Plan: Continue all current medications. Continue use of the night splint. I did place her in a Cam Walker today after a subcutaneous injection of 2 mg of dexamethasone to the point of maximal tenderness.

## 2013-08-26 ENCOUNTER — Ambulatory Visit: Payer: 59 | Admitting: Podiatry

## 2013-08-28 ENCOUNTER — Encounter: Payer: Self-pay | Admitting: Family Medicine

## 2013-08-28 ENCOUNTER — Ambulatory Visit (INDEPENDENT_AMBULATORY_CARE_PROVIDER_SITE_OTHER): Payer: 59 | Admitting: Family Medicine

## 2013-08-28 VITALS — BP 140/90 | HR 85 | Temp 98.2°F | Ht 68.0 in | Wt 234.0 lb

## 2013-08-28 DIAGNOSIS — E785 Hyperlipidemia, unspecified: Secondary | ICD-10-CM

## 2013-08-28 DIAGNOSIS — E669 Obesity, unspecified: Secondary | ICD-10-CM

## 2013-08-28 DIAGNOSIS — F4321 Adjustment disorder with depressed mood: Secondary | ICD-10-CM

## 2013-08-28 DIAGNOSIS — R000 Tachycardia, unspecified: Secondary | ICD-10-CM

## 2013-08-28 DIAGNOSIS — I10 Essential (primary) hypertension: Secondary | ICD-10-CM

## 2013-08-28 LAB — CBC
HCT: 39.9 % (ref 36.0–46.0)
Hemoglobin: 13.7 g/dL (ref 12.0–15.0)
MCH: 31.1 pg (ref 26.0–34.0)
MCHC: 34.3 g/dL (ref 30.0–36.0)
MCV: 90.7 fL (ref 78.0–100.0)
PLATELETS: 360 10*3/uL (ref 150–400)
RBC: 4.4 MIL/uL (ref 3.87–5.11)
RDW: 12.9 % (ref 11.5–15.5)
WBC: 8.6 10*3/uL (ref 4.0–10.5)

## 2013-08-28 LAB — LIPID PANEL
Cholesterol: 193 mg/dL (ref 0–200)
HDL: 32 mg/dL — ABNORMAL LOW (ref >39)
LDL Cholesterol: 99 mg/dL (ref 0–99)
TRIGLYCERIDES: 308 mg/dL — AB (ref <150)
Total CHOL/HDL Ratio: 6 Ratio
VLDL: 62 mg/dL — ABNORMAL HIGH (ref 0–40)

## 2013-08-28 LAB — RENAL FUNCTION PANEL
Albumin: 4.2 g/dL (ref 3.5–5.2)
BUN: 16 mg/dL (ref 6–23)
CHLORIDE: 97 meq/L (ref 96–112)
CO2: 30 mEq/L (ref 19–32)
CREATININE: 0.68 mg/dL (ref 0.50–1.10)
Calcium: 9 mg/dL (ref 8.4–10.5)
Glucose, Bld: 101 mg/dL — ABNORMAL HIGH (ref 70–99)
PHOSPHORUS: 3.3 mg/dL (ref 2.3–4.6)
POTASSIUM: 4 meq/L (ref 3.5–5.3)
Sodium: 137 mEq/L (ref 135–145)

## 2013-08-28 LAB — HEPATIC FUNCTION PANEL
ALK PHOS: 88 U/L (ref 39–117)
ALT: 30 U/L (ref 0–35)
AST: 24 U/L (ref 0–37)
Albumin: 4.2 g/dL (ref 3.5–5.2)
BILIRUBIN TOTAL: 0.5 mg/dL (ref 0.2–1.2)
Bilirubin, Direct: 0.1 mg/dL (ref 0.0–0.3)
Indirect Bilirubin: 0.4 mg/dL (ref 0.2–1.2)
Total Protein: 7.3 g/dL (ref 6.0–8.3)

## 2013-08-28 LAB — TSH: TSH: 0.643 u[IU]/mL (ref 0.350–4.500)

## 2013-08-28 MED ORDER — VALSARTAN-HYDROCHLOROTHIAZIDE 160-12.5 MG PO TABS: 1.0000 | ORAL_TABLET | Freq: Every day | ORAL | Status: DC

## 2013-08-28 MED ORDER — ALPRAZOLAM 1 MG PO TABS: ORAL_TABLET | ORAL | Status: DC

## 2013-08-28 MED ORDER — METOPROLOL SUCCINATE ER 25 MG PO TB24: 25.0000 mg | ORAL_TABLET | Freq: Every day | ORAL | Status: DC

## 2013-08-28 NOTE — Progress Notes (Signed)
Pre visit review using our clinic review tool, if applicable. No additional management support is needed unless otherwise documented below in the visit note. 

## 2013-08-29 ENCOUNTER — Telehealth: Payer: Self-pay

## 2013-08-29 ENCOUNTER — Other Ambulatory Visit: Payer: Self-pay | Admitting: Family Medicine

## 2013-08-29 DIAGNOSIS — E785 Hyperlipidemia, unspecified: Secondary | ICD-10-CM

## 2013-08-29 MED ORDER — FENOFIBRATE 145 MG PO TABS: 145.0000 mg | ORAL_TABLET | Freq: Every day | ORAL | Status: DC

## 2013-08-29 NOTE — Telephone Encounter (Signed)
Pt stated that "she wants to go ahead and begin the Fenofibrate." LDM

## 2013-08-29 NOTE — Telephone Encounter (Signed)
I have sent in the Fenofibrate 145 mg tab take 1 daily to her pharmacy with refills and then she should come back for lipid panel in about 3 months to make sure it is helping

## 2013-08-31 ENCOUNTER — Encounter: Payer: Self-pay | Admitting: Family Medicine

## 2013-08-31 NOTE — Assessment & Plan Note (Signed)
Well controlled, no changes to meds. Encouraged heart healthy diet such as the DASH diet and exercise as tolerated.  °

## 2013-08-31 NOTE — Assessment & Plan Note (Signed)
Encouraged heart healthy diet, increase exercise, avoid trans fats, consider a krill oil cap daily. Started on Tricor 145  daily

## 2013-08-31 NOTE — Assessment & Plan Note (Signed)
RRR 

## 2013-08-31 NOTE — Assessment & Plan Note (Signed)
Encouraged DASH diet, decrease po intake and increase exercise as tolerated. Needs 7-8 hours of sleep nightly. Avoid trans fats, eat small, frequent meals every 4-5 hours with lean proteins, complex carbs and healthy fats. Minimize simple carbs, GMO foods. 

## 2013-08-31 NOTE — Progress Notes (Signed)
Patient ID: Kathleen Church, female   DOB: 1961-01-03, 53 y.o.   MRN: [id number] EBONI COVAL [id number] 29-Jun-1960 08/31/2013      Progress Note-Follow Up  Subjective  Chief Complaint  No chief complaint on file.   HPI  Patient is a 53 year old female in today for routine medical care. Still struggling with grief secondary to sudden death of her husband but managing to work and care for her daughters. No suicidal ideation but he is noted. No recent illness. Denies CP/palp/SOB/HA/congestion/fevers/GI or GU c/o. Taking meds as prescribed  Past Medical History  Diagnosis Date  . Asthma, mild persistent     adult onset  . Measles as a child  . Mumps as a child  . Anxiety     when father passed  . Hypertension 2010  . Allergic state   . Obesity   . Preventative health care 02/13/2011  . Sinusitis 02/13/2011  . Cervical cancer screening 04/27/2011  . Hypokalemia 04/27/2011  . Edema 04/27/2011  . Hyperlipidemia 04/27/2011  . Plantar fasciitis, left 01/19/2013    Past Surgical History  Procedure Laterality Date  . Endometrial ablation  2006    menorraghia  . Knee surgery  2006    left knee  . Tonsillectomy    . Tubal ligation      Family History  Problem Relation Age of Onset  . Cancer Father     kidney  . Cancer Maternal Grandmother     bone  . Cancer Paternal Grandmother     breast  . Asthma Daughter   . Heart attack Paternal Grandfather   . Asthma Daughter     History   Social History  . Marital Status: Married    Spouse Name: N/A    Number of Children: N/A  . Years of Education: N/A   Occupational History  . Not on file.   Social History Main Topics  . Smoking status: Never Smoker   . Smokeless tobacco: Never Used  . Alcohol Use: No  . Drug Use: No  . Sexual Activity: Yes    Partners: Male   Other Topics Concern  . Not on file   Social History Narrative  . No narrative on file    Current Outpatient Prescriptions on File Prior to Visit   Medication Sig Dispense Refill  . albuterol (PROVENTIL HFA;VENTOLIN HFA) 108 (90 BASE) MCG/ACT inhaler Inhale 2 puffs into the lungs every 6 (six) hours as needed.  3 Inhaler  1  . aspirin 81 MG tablet Take 81 mg by mouth daily.      . budesonide (PULMICORT) 180 MCG/ACT inhaler Inhale 2 puffs into the lungs 2 (two) times daily.  3 Inhaler  1  . meloxicam (MOBIC) 15 MG tablet TAKE 1 TABLET BY MOUTH EVERY DAY  30 tablet  3  . montelukast (SINGULAIR) 10 MG tablet Take 1 tablet (10 mg total) by mouth at bedtime.  90 tablet  1   No current facility-administered medications on file prior to visit.    Allergies  Allergen Reactions  . Codeine Palpitations and Rash    Review of Systems  Review of Systems  Constitutional: Negative for fever and malaise/fatigue.  HENT: Negative for congestion.   Eyes: Negative for discharge.  Respiratory: Negative for shortness of breath.   Cardiovascular: Negative for chest pain, palpitations and leg swelling.  Gastrointestinal: Negative for nausea, abdominal pain and diarrhea.  Genitourinary: Negative for dysuria.  Musculoskeletal: Negative for falls.  Skin: Negative  for rash.  Neurological: Negative for loss of consciousness and headaches.  Endo/Heme/Allergies: Negative for polydipsia.  Psychiatric/Behavioral: Positive for depression. Negative for suicidal ideas. The patient is nervous/anxious. The patient does not have insomnia.     Objective  BP 140/90  Pulse 85  Temp(Src) 98.2 F (36.8 C) (Oral)  Ht  (1.727 m)  Wt 234 lb (106.142 kg)  BMI 35.59 kg/m2  SpO2 99%  Physical Exam  Physical Exam  Constitutional: She is oriented to person, place, and time and well-developed, well-nourished, and in no distress. No distress.  HENT:  Head: Normocephalic and atraumatic.  Eyes: Conjunctivae are normal.  Neck: Neck supple. No thyromegaly present.  Cardiovascular: Normal rate, regular rhythm and normal heart sounds.   No murmur  heard. Pulmonary/Chest: Effort normal and breath sounds normal. She has no wheezes.  Abdominal: She exhibits no distension and no mass.  Musculoskeletal: She exhibits no edema.  Lymphadenopathy:    She has no cervical adenopathy.  Neurological: She is alert and oriented to person, place, and time.  Skin: Skin is warm and dry. No rash noted. She is not diaphoretic.  Psychiatric: Memory, affect and judgment normal.    Lab Results  Component Value Date   TSH 0.643 08/28/2013   Lab Results  Component Value Date   WBC 8.6 08/28/2013   HGB 13.7 08/28/2013   HCT 39.9 08/28/2013   MCV 90.7 08/28/2013   PLT 360 08/28/2013   Lab Results  Component Value Date   CREATININE 0.68 08/28/2013   BUN 16 08/28/2013   NA 137 08/28/2013   K 4.0 08/28/2013   CL 97 08/28/2013   CO2 30 08/28/2013   Lab Results  Component Value Date   ALT 30 08/28/2013   AST 24 08/28/2013   ALKPHOS 88 08/28/2013   BILITOT 0.5 08/28/2013   Lab Results  Component Value Date   CHOL 193 08/28/2013   Lab Results  Component Value Date   HDL 32* 08/28/2013   Lab Results  Component Value Date   LDLCALC 99 08/28/2013   Lab Results  Component Value Date   TRIG 308* 08/28/2013   Lab Results  Component Value Date   CHOLHDL 6.0 08/28/2013     Assessment & Plan  Hypertension Well controlled, no changes to meds. Encouraged heart healthy diet such as the DASH diet and exercise as tolerated.   Hyperlipidemia Encouraged heart healthy diet, increase exercise, avoid trans fats, consider a krill oil cap daily. Started on Tricor 145  daily  Grief reaction Still struggling with anxiety secondary to sudden death of her husband but is managing for the most part she will let us know if she needs further meds or assistance  Obesity Encouraged DASH diet, decrease po intake and increase exercise as tolerated. Needs 7-8 hours of sleep nightly. Avoid trans fats, eat small, frequent meals every 4-5 hours with lean proteins, complex carbs  and healthy fats. Minimize simple carbs, GMO foods.  Tachycardia RRR

## 2013-08-31 NOTE — Assessment & Plan Note (Signed)
Still struggling with anxiety secondary to sudden death of her husband but is managing for the most part she will let us know if she needs further meds or assistance

## 2013-09-09 ENCOUNTER — Ambulatory Visit: Payer: 59 | Admitting: Podiatry

## 2013-09-11 ENCOUNTER — Ambulatory Visit (INDEPENDENT_AMBULATORY_CARE_PROVIDER_SITE_OTHER): Payer: 59 | Admitting: Podiatry

## 2013-09-11 ENCOUNTER — Encounter: Payer: Self-pay | Admitting: Podiatry

## 2013-09-11 VITALS — BP 137/86 | HR 89 | Resp 16

## 2013-09-11 DIAGNOSIS — M7660 Achilles tendinitis, unspecified leg: Secondary | ICD-10-CM

## 2013-09-11 NOTE — Progress Notes (Signed)
She presents today for followup of her Achilles tendinitis left. She states that she has some good days and some bad days.  Objective: She still has tenderness on palpation of the left posterior heel. The heel is rather warm to touch. It is tender on palpation.  Assessment: Posterior tendo Achilles tendinitis left.  Plan: Physical therapy and followup with me in 5 weeks.

## 2013-09-19 ENCOUNTER — Telehealth: Payer: Self-pay | Admitting: *Deleted

## 2013-09-19 DIAGNOSIS — T148XXA Other injury of unspecified body region, initial encounter: Secondary | ICD-10-CM

## 2013-09-19 DIAGNOSIS — M7660 Achilles tendinitis, unspecified leg: Secondary | ICD-10-CM

## 2013-09-19 NOTE — Telephone Encounter (Signed)
Patient of Dr. Al Corpus, I see him for Achilles Tendonitis.  I was in there last week and he wants me to go to therapy 3 times a week.  I checked on it, it's $45 dollars a visit every time I go.  I can't do that!  So, what's my next option.  He said something about an MRI.  I'm not doing $45 a visit, I just can't do it.  That's like $500.  I've already paid for a boot.  Ask him what my next option is.

## 2013-09-22 NOTE — Telephone Encounter (Signed)
Next would be MRI of the achilles to look for a tear.  If there is a tear present then Surgery and out of work for atleast a month possibly two.  If not tear but just tendinitis then orthotics might work out for her.

## 2013-09-23 NOTE — Telephone Encounter (Signed)
I returned her call and informed her that Dr. Al Corpus said a MRI is the next step to rule out a tear.  If there is a tear, that would entail surgery.  If there is not a tear then you may benefit from some orthotics.  I asked her if she would like to get the MRI.  She stated, yes let's proceed, I can't afford paying $500 for physical therapy.  I told her I would send the referral, they should call you to get it scheduled.  She stated, "Okay, thanks for calling."  Referral was faxed to [facility].

## 2013-09-29 ENCOUNTER — Other Ambulatory Visit: Payer: Self-pay | Admitting: Podiatry

## 2013-09-29 ENCOUNTER — Other Ambulatory Visit: Payer: 59

## 2013-09-29 ENCOUNTER — Ambulatory Visit
Admission: RE | Admit: 2013-09-29 | Discharge: 2013-09-29 | Disposition: A | Discharge status: Home / Self Care | Payer: 59 | Source: Ambulatory Visit | Attending: Podiatry | Admitting: Podiatry

## 2013-09-29 DIAGNOSIS — M7662 Achilles tendinitis, left leg: Secondary | ICD-10-CM

## 2013-09-29 DIAGNOSIS — T148XXA Other injury of unspecified body region, initial encounter: Secondary | ICD-10-CM

## 2013-09-30 ENCOUNTER — Other Ambulatory Visit: Payer: 59

## 2013-10-01 ENCOUNTER — Telehealth: Payer: Self-pay | Admitting: *Deleted

## 2013-10-01 NOTE — Telephone Encounter (Signed)
I called and left her a message that we received results.  Dr. Al Corpus wants it to be re-read.  We are sending it to [facility] for a second opinion.  Please call if you have any questions.  I sent the disk and note to [facility] for second opinion.

## 2013-10-03 ENCOUNTER — Telehealth: Payer: Self-pay | Admitting: *Deleted

## 2013-10-03 NOTE — Telephone Encounter (Signed)
I'm a patient of Dr. Al Corpus.  I had an MRI done on Monday night.  On Wednesday someone from the office had called and said he wants another doctor to look at it.  Is there anyway you can let me know what they figured out?  I called and left her a message that we have not received the results back from [facility] thus far.  Once we get it back and Dr. Al Corpus reviews it we will give you a call.

## 2013-10-14 ENCOUNTER — Encounter: Payer: Self-pay | Admitting: Podiatry

## 2013-10-14 ENCOUNTER — Ambulatory Visit (INDEPENDENT_AMBULATORY_CARE_PROVIDER_SITE_OTHER): Payer: 59 | Admitting: Podiatry

## 2013-10-14 VITALS — BP 135/77 | HR 89 | Resp 16

## 2013-10-14 DIAGNOSIS — M7662 Achilles tendinitis, left leg: Secondary | ICD-10-CM

## 2013-10-14 DIAGNOSIS — T148XXA Other injury of unspecified body region, initial encounter: Secondary | ICD-10-CM

## 2013-10-14 DIAGNOSIS — M722 Plantar fascial fibromatosis: Secondary | ICD-10-CM

## 2013-10-14 DIAGNOSIS — T148 Other injury of unspecified body region: Secondary | ICD-10-CM

## 2013-10-14 NOTE — Progress Notes (Signed)
She presents today for followup of her Achilles tendinitis. She states it really hasn't gotten any better and she's concerned that her she is known to get better.  Objective: We have reviewed her past medical history medications allergies surgeries and social history. Vital signs are stable she is alert and oriented x3. Her MRI was positive for insertional tear and fraying of the tendo Achilles left area and with chronic proximal plantar fasciitis left.  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  Plan: We discussed etiology pathology conservative versus surgical therapies. Consented her for surgical repair of her tendo Achilles and retrocalcaneal heel spur. We also consented her for an endoscopic plantar fasciotomy. She's also consented for a cast application. We went over consent form today line bylined number by number giving her ample time to ask questions she saw fit regarding these procedures. I answered them to the best of my ability in layman's terms she understood it was amenable to it and signed all 3 pages of the consent form. We'll followup with her in the near future for surgical intervention.

## 2013-10-14 NOTE — Patient Instructions (Signed)
Pre-Operative Instructions  Congratulations, you have decided to take an important step to improving your quality of life.  You can be assured that the doctors of [facility] will be with you every step of the way.  1. Plan to be at the surgery center/hospital at least 1 (one) hour prior to your scheduled time unless otherwise directed by the surgical center/hospital staff.  You must have a responsible adult accompany you, remain during the surgery and drive you home.  Make sure you have directions to the surgical center/hospital and know how to get there on time. 2. For hospital based surgery you will need to obtain a history and physical form from your family physician within 1 month prior to the date of surgery- we will give you a form for you primary physician.  3. We make every effort to accommodate the date you request for surgery.  There are however, times where surgery dates or times have to be moved.  We will contact you as soon as possible if a change in schedule is required.   4. No Aspirin/Ibuprofen for one week before surgery.  If you are on aspirin, any non-steroidal anti-inflammatory medications (Mobic, Aleve, Ibuprofen) you should stop taking it 7 days prior to your surgery.  You make take Tylenol  For pain prior to surgery.  5. Medications- If you are taking daily heart and blood pressure medications, seizure, reflux, allergy, asthma, anxiety, pain or diabetes medications, make sure the surgery center/hospital is aware before the day of surgery so they may notify you which medications to take or avoid the day of surgery. 6. No food or drink after midnight the night before surgery unless directed otherwise by surgical center/hospital staff. 7. No alcoholic beverages 24 hours prior to surgery.  No smoking 24 hours prior to or 24 hours after surgery. 8. Wear loose pants or shorts- loose enough to fit over bandages, boots, and casts. 9. No slip on shoes, sneakers are best. 10. Bring  your boot with you to the surgery center/hospital.  Also bring crutches or a walker if your physician has prescribed it for you.  If you do not have this equipment, it will be provided for you after surgery. 11. If you have not been contracted by the surgery center/hospital by the day before your surgery, call to confirm the date and time of your surgery. 12. Leave-time from work may vary depending on the type of surgery you have.  Appropriate arrangements should be made prior to surgery with your employer. 13. Prescriptions will be provided immediately following surgery by your doctor.  Have these filled as soon as possible after surgery and take the medication as directed. 14. Remove nail polish on the operative foot. 15. Wash the night before surgery.  The night before surgery wash the foot and leg well with the antibacterial soap provided and water paying special attention to beneath the toenails and in between the toes.  Rinse thoroughly with water and dry well with a towel.  Perform this wash unless told not to do so by your physician.  Enclosed: 1 Ice pack (please put in freezer the night before surgery)   1 Hibiclens skin cleaner   Pre-op Instructions  If you have any questions regarding the instructions, do not hesitate to call our office.  [place]: [address] [place], [place] [address] [phone]  [place]: [address] [place], [place] [address] [phone]  [place]: [address]  [place], [place] [address] [phone]  Dr. Richard   Tuchman DPM, Dr. Norman Regal DPM Dr. Richard Sikora DPM, Dr. M. Todd Hyatt DPM, Dr. Kathryn Egerton DPM 

## 2013-10-16 ENCOUNTER — Ambulatory Visit: Payer: 59 | Admitting: Podiatry

## 2013-10-28 ENCOUNTER — Other Ambulatory Visit: Payer: Self-pay | Admitting: Podiatry

## 2013-10-28 MED ORDER — CEPHALEXIN 500 MG PO CAPS
500.0000 mg | ORAL_CAPSULE | Freq: Three times a day (TID) | ORAL | Status: DC
Start: 2013-10-28 — End: 2014-03-19

## 2013-10-28 MED ORDER — OXYCODONE-ACETAMINOPHEN 10-325 MG PO TABS: ORAL_TABLET | ORAL | Status: DC

## 2013-10-28 MED ORDER — PROMETHAZINE HCL 25 MG PO TABS: 25.0000 mg | ORAL_TABLET | Freq: Three times a day (TID) | ORAL | Status: DC | PRN

## 2013-10-31 DIAGNOSIS — M722 Plantar fascial fibromatosis: Secondary | ICD-10-CM

## 2013-10-31 DIAGNOSIS — M65872 Other synovitis and tenosynovitis, left ankle and foot: Secondary | ICD-10-CM

## 2013-10-31 DIAGNOSIS — M25775 Osteophyte, left foot: Secondary | ICD-10-CM

## 2013-11-01 ENCOUNTER — Encounter: Payer: Self-pay | Admitting: Podiatry

## 2013-11-01 ENCOUNTER — Telehealth: Payer: Self-pay

## 2013-11-01 NOTE — Telephone Encounter (Signed)
Spoke with patient regarding post operative status, she stated that she was doing well, her foot was still numb from block but overall she felt well. Advise to elevated and rest and keep dressing intact and dry, call with any questions or concerns

## 2013-11-01 NOTE — Progress Notes (Signed)
Dr Al CorpusHyatt performed a left EPF, left tenolysis and left heel spur resection on 10/31/13

## 2013-11-06 ENCOUNTER — Ambulatory Visit (INDEPENDENT_AMBULATORY_CARE_PROVIDER_SITE_OTHER): Payer: 59 | Admitting: Podiatry

## 2013-11-06 ENCOUNTER — Ambulatory Visit (INDEPENDENT_AMBULATORY_CARE_PROVIDER_SITE_OTHER): Payer: 59

## 2013-11-06 VITALS — BP 129/71 | HR 92 | Temp 96.1°F | Resp 16

## 2013-11-06 DIAGNOSIS — Z9889 Other specified postprocedural states: Secondary | ICD-10-CM

## 2013-11-06 DIAGNOSIS — M779 Enthesopathy, unspecified: Secondary | ICD-10-CM

## 2013-11-06 DIAGNOSIS — M2012 Hallux valgus (acquired), left foot: Secondary | ICD-10-CM

## 2013-11-06 DIAGNOSIS — M7662 Achilles tendinitis, left leg: Secondary | ICD-10-CM

## 2013-11-06 DIAGNOSIS — M21612 Bunion of left foot: Secondary | ICD-10-CM

## 2013-11-06 NOTE — Progress Notes (Signed)
She presents today 1 week status post EPF and Achilles tendon lysis. She presents with her cast intact left lower extremity. She denies fever chills nausea vomiting muscle aches and chest pains or shortness of breath.  Objective: Vital signs are stable she is alert and oriented 3 cast is intact and does not appear to piston or move. Radiographic evaluation demonstrates complete dorsal exostectomy of the calcaneus left.  Assessment: Well-healing surgical foot.  Plan: We'll let the cast on today we will remove it next visit redressed the foot with a dry sterile compressive dressing and recast.

## 2013-11-13 ENCOUNTER — Ambulatory Visit (INDEPENDENT_AMBULATORY_CARE_PROVIDER_SITE_OTHER): Payer: 59 | Admitting: Podiatry

## 2013-11-13 DIAGNOSIS — Z9889 Other specified postprocedural states: Secondary | ICD-10-CM

## 2013-11-13 NOTE — Progress Notes (Signed)
She presents today for follow-up of her Achilles tendon lysis and a retrocalcaneal heel spur resection with an endoscopic plantar fasciotomy left foot. She denies fever chills nausea vomiting muscle aches and pains. She is tired of the cast.  Objective: Vital signs are stable she is alert and oriented 3. There is no erythema edema saline as drainage or odor was the cast is removed. Margins appear to be intact sutures were removed. Half of the staples were removed. She has mild tenderness on dorsiflexion but minimally so and she has good plantarflexion actively.  Assessment: Well-healing Achilles tendinitis and surgical foot left.  Plan: Cast was removed today. A dry dressing with compression was applied. She will start soaking this once daily and a antibacterial wash and then soak in Epsom salts and water. I will follow-up with her in 2 weeks. We will place her in a Cam Walker and allow range of motion exercises only. Otherwise she will remain nonweightbearing.

## 2013-11-16 ENCOUNTER — Other Ambulatory Visit: Payer: Self-pay | Admitting: Podiatry

## 2013-11-26 ENCOUNTER — Telehealth: Payer: Self-pay | Admitting: *Deleted

## 2013-11-26 NOTE — Telephone Encounter (Signed)
Patient called stating that [employer] has not received disability paperwork and needed it yesterday to process.

## 2013-11-26 NOTE — Telephone Encounter (Signed)
Returned call-patient said that [facility] and hasn't been done as of yesterday. She said it needed to be sent to fax # [phone] attention [place]. I advised I would forward this message to the appropriate person. She did say she spoke to someone yesterday about this already.

## 2013-11-27 ENCOUNTER — Encounter: Payer: Self-pay | Admitting: Family Medicine

## 2013-11-27 ENCOUNTER — Ambulatory Visit (INDEPENDENT_AMBULATORY_CARE_PROVIDER_SITE_OTHER): Payer: 59 | Admitting: Podiatry

## 2013-11-27 VITALS — BP 125/77 | HR 80 | Resp 16

## 2013-11-27 DIAGNOSIS — Z9889 Other specified postprocedural states: Secondary | ICD-10-CM

## 2013-11-28 NOTE — Progress Notes (Signed)
She presents today almost 1 month status post retrocalcaneal heel spur resection with Achilles tendon lysis and a EPF. She states that is doing quite well. She denies fever chills nausea vomiting muscle aches and pains. States that she has not been bearing weight on it.  Objective: Vital signs are stable she is alert and oriented 3. All of her staples were removed today margins remain well coapted I see no signs of infection.  Assessment: Well-healing Richard foot surgery left.  Plan: Compression anklet with her Cam Walker. She may start bearing weight to his left foot. I will allow her to go back to work provided she can sit with her foot elevated. I will follow up with her in 2 weeks to reevaluate.

## 2013-12-03 ENCOUNTER — Telehealth: Payer: Self-pay | Admitting: *Deleted

## 2013-12-03 ENCOUNTER — Ambulatory Visit (INDEPENDENT_AMBULATORY_CARE_PROVIDER_SITE_OTHER): Payer: 59 | Admitting: Podiatry

## 2013-12-03 ENCOUNTER — Encounter: Payer: Self-pay | Admitting: Podiatry

## 2013-12-03 VITALS — BP 119/67 | HR 90 | Temp 96.4°F | Resp 13

## 2013-12-03 DIAGNOSIS — Z9889 Other specified postprocedural states: Secondary | ICD-10-CM

## 2013-12-03 MED ORDER — CEPHALEXIN 500 MG PO CAPS: 500.0000 mg | ORAL_CAPSULE | Freq: Three times a day (TID) | ORAL | Status: DC

## 2013-12-03 NOTE — Patient Instructions (Signed)
Monitor for any signs/symptoms of infection. Call the office immediately if any occur or go directly to the emergency room. Call with any questions/concerns.  

## 2013-12-03 NOTE — Telephone Encounter (Addendum)
Pt states the last of her staples were removed at last visit, now this morning the bottom of the suture line is red, with a pus pocket the size of a dime and painful.  Pt asked if Dr. Al CorpusHyatt wanted to order the antibiotics again.  I offered pt an appt with Dr. Ardelle AntonWagoner today at 115pm.  Pt accepted.

## 2013-12-08 NOTE — Telephone Encounter (Signed)
good

## 2013-12-08 NOTE — Progress Notes (Signed)
Patient ID: Kathleen LericheKimberly K Isaacson, female   DOB: 06/18/1960, 53 y.o.   MRN: [id number]  Subjective: 53 year old female returns the office today for concerns of a wound and drainage over surgical site to the left heel. She states that over the last couple days she has noticed a wound along the incision as well as some drainage. She has noticed some yellow/white color to the wound and she was concerned that this is purulence. She has been covering the area with a dressing daily. She states that she has some mild discomfort overlying the surgical site but is not increased recently. She has continued wearing the cam walker. She denies any systemic complaints as fevers, chills, nausea, vomiting. No other complaints at this time. Denies any acute changes since last appointment.  Objective: AAO x3, NAD DP/PT pulses palpable bilaterally, CRT less than 3 seconds Protective sensation intact with Simms Weinstein monofilament, vibratory sensation intact Incision along the posterior aspect of the left heel is well coapted proximally. There is a small superficial wound along the distal aspect the incision with overlying fibrotic tissue. There is mild serous drainage identified however no purulence. There is no surrounding erythema, ascending cellulitis. No fluctuance or crepitus. No malodor. Mild tenderness on the course of the surgical site. No pain with calf compression, swelling, warmth, erythema.  Assessment: 53 year old female with superficial  wound on the distal aspect of the incision.  Plan: -Treatment options were discussed including alternatives, risks, complications. -The wound was lightly debrided to remove any nonviable tissue overlying it. There is no purulence identified in the wound is superficial. However given the wound and recent surgery will start Keflex prophylactically.  -Continue with daily dressing changes with antibiotic ointment and a dressing.  -To help take the pressure off the back of the  heel from rubbing into the CAM Walker additional padding was added to the CAM boot. -Monitor for any clinical signs or symptoms of infection and directed to call the office if any are to occur or go to the emergency room. -Follow-up as scheduled with Dr. Al CorpusHyatt. In the meantime, call the office in the questions, concerns, change in symptoms.

## 2013-12-11 ENCOUNTER — Encounter: Payer: Self-pay | Admitting: Podiatry

## 2013-12-11 ENCOUNTER — Ambulatory Visit (INDEPENDENT_AMBULATORY_CARE_PROVIDER_SITE_OTHER): Payer: 59 | Admitting: Podiatry

## 2013-12-11 VITALS — BP 134/86 | HR 87 | Resp 16

## 2013-12-11 DIAGNOSIS — Z9889 Other specified postprocedural states: Secondary | ICD-10-CM

## 2013-12-11 NOTE — Progress Notes (Signed)
She presents today status post Achilles tendon lysis with exostectomy of the left heel and a EPF. She denies fever chills nausea vomiting muscle aches and pains. Continues to wear the cam walker regularly.  Objective: No erythema minimal edema no cellulitis drainage or odor left foot. She has full range of motion and full muscle strength left foot.  Assessment: Well-healing surgical foot left.  Plan: I put her in a tri-lock brace and a compression anklet and back into her tennis shoes. She was given specific instructions on what to do and what not to do with her limitations. I will follow-up with her in 2 weeks.

## 2013-12-23 ENCOUNTER — Other Ambulatory Visit: Payer: Self-pay | Admitting: Family Medicine

## 2013-12-25 ENCOUNTER — Other Ambulatory Visit: Payer: Self-pay | Admitting: *Deleted

## 2013-12-25 ENCOUNTER — Ambulatory Visit: Payer: 59 | Admitting: Podiatry

## 2013-12-25 DIAGNOSIS — T7840XA Allergy, unspecified, initial encounter: Secondary | ICD-10-CM

## 2013-12-25 MED ORDER — MONTELUKAST SODIUM 10 MG PO TABS: 10.0000 mg | ORAL_TABLET | Freq: Every day | ORAL | Status: DC

## 2013-12-25 NOTE — Telephone Encounter (Signed)
Rx sent to the pharmacy by -script.//AB/CMA 

## 2014-01-06 ENCOUNTER — Ambulatory Visit (INDEPENDENT_AMBULATORY_CARE_PROVIDER_SITE_OTHER): Payer: 59

## 2014-01-06 ENCOUNTER — Ambulatory Visit (INDEPENDENT_AMBULATORY_CARE_PROVIDER_SITE_OTHER): Payer: 59 | Admitting: Podiatry

## 2014-01-06 VITALS — BP 120/70 | HR 80 | Resp 16

## 2014-01-06 DIAGNOSIS — M7662 Achilles tendinitis, left leg: Secondary | ICD-10-CM

## 2014-01-06 DIAGNOSIS — Z9889 Other specified postprocedural states: Secondary | ICD-10-CM

## 2014-01-07 NOTE — Progress Notes (Signed)
She presents today status post tendo Achilles tendon lysis and endoscopic plantar fasciitis and a bunion repair left foot. She denies fever chills nausea vomiting muscles and aches however she does have pain about the left tendo Achilles and the left mid arch. She denies changes in her past medical history medications allergies surgeries or social history.  Objective: Vital signs are stable she is alert and oriented 3 left foot appears to be more edematous and warm to the touch in the posterior and posterior medial aspect of the left heel from previous evaluation. She is trying to ambulate in regular shoe gear which she is experiencing soreness in the medial longitudinal arch along the posterior tibial tendon and the tendo Achilles. Radiographically she appears to be healing quite nicely.  Assessment: Slight setback secondary to edema and pain in the tendo Achilles. Status post Achilles tendon lysis endoscopic plantar fasciotomy N [place] bunion repair  Plan: I encouraged her to continue range of motion exercises. In physical therapy was ordered.

## 2014-01-13 ENCOUNTER — Telehealth: Payer: Self-pay | Admitting: *Deleted

## 2014-01-13 NOTE — Telephone Encounter (Signed)
"  Patient is scheduled for physical therapy this morning.  We know she had surgery.  Is there anything specifically we need to do?"  She had an achilles tendon repair.  He wants to increase her range of motion.  "Do you have a script you can send us?"  Yes, I have a prescription.  "Please fax it to [phone].  I faxed the prescription.

## 2014-03-19 ENCOUNTER — Ambulatory Visit (INDEPENDENT_AMBULATORY_CARE_PROVIDER_SITE_OTHER): Payer: 59 | Admitting: Podiatry

## 2014-03-19 ENCOUNTER — Ambulatory Visit (INDEPENDENT_AMBULATORY_CARE_PROVIDER_SITE_OTHER): Payer: 59

## 2014-03-19 ENCOUNTER — Encounter: Payer: Self-pay | Admitting: Podiatry

## 2014-03-19 VITALS — BP 151/88 | HR 90 | Resp 16

## 2014-03-19 DIAGNOSIS — M2012 Hallux valgus (acquired), left foot: Secondary | ICD-10-CM

## 2014-03-19 DIAGNOSIS — S92302A Fracture of unspecified metatarsal bone(s), left foot, initial encounter for closed fracture: Secondary | ICD-10-CM

## 2014-03-19 NOTE — Progress Notes (Signed)
She presents today with a chief complaint of a painful lateral aspect of her left foot. She states that she fell this past Tuesday while cleaning the driveway. She states that is within swollen and painful since that time.  Objective: Vital signs are stable alert and oriented 3 pulses are palpable left. Moderate edema left foot. Pain on palpation fifth metatarsal metadiaphyseal region. Radiographic evaluation demonstrates a nondisplaced oblique fracture distal diaphyseal region left fifth metatarsal no comminution noted.  Assessment fractured fifth metatarsal and good alignment left.  Plan: Place her in a Cam Walker which she has at home and I will follow up with her in 4 weeks.

## 2014-03-25 ENCOUNTER — Other Ambulatory Visit: Payer: Self-pay | Admitting: Family Medicine

## 2014-03-25 NOTE — Telephone Encounter (Signed)
Last filled: 12/24/13 Amt: 30, 2 Last OV:  08/28/13  Med filled x 1 month.

## 2014-04-16 ENCOUNTER — Encounter: Payer: Self-pay | Admitting: Podiatry

## 2014-04-16 ENCOUNTER — Ambulatory Visit (INDEPENDENT_AMBULATORY_CARE_PROVIDER_SITE_OTHER): Payer: 59 | Admitting: Podiatry

## 2014-04-16 ENCOUNTER — Ambulatory Visit (INDEPENDENT_AMBULATORY_CARE_PROVIDER_SITE_OTHER): Payer: 59

## 2014-04-16 VITALS — BP 141/79 | HR 89 | Resp 15

## 2014-04-16 DIAGNOSIS — S92302D Fracture of unspecified metatarsal bone(s), left foot, subsequent encounter for fracture with routine healing: Secondary | ICD-10-CM | POA: Diagnosis not present

## 2014-04-16 NOTE — Progress Notes (Signed)
   Subjective:    Patient ID: Kathleen Church, female    DOB: 07/25/1960, 54 y.o.   MRN: [id number]  HPI Pt presents for follow up on left foot fracture, states that her pain has improved, and only has minimal swelling   Review of Systems     Objective:   Physical Exam: Her pulses remain palpable left lower extremity she has no tenderness on palpation of the fifth metatarsal base left. Much decrease in edema and erythema from previous visits. She has no tenderness on palpation of the peroneal longus longus tendon however radiographically she does have a os perineum that appears to be bipartite or fractured with some soft tissue swelling around the tendon. The fifth metatarsal base appears to be healing well.        Assessment & Plan:  Assessment: Fifth metatarsal fracture healing well. Rule out a tear in the erroneous longus tendon left.  Plan: I will allow her to get back into her regular shoes but not participate in any heavy activity for at least another month.

## 2014-04-22 ENCOUNTER — Telehealth: Payer: Self-pay

## 2014-04-22 MED ORDER — FENOFIBRATE 145 MG PO TABS: 145.0000 mg | ORAL_TABLET | Freq: Every day | ORAL | Status: DC

## 2014-04-22 NOTE — Telephone Encounter (Signed)
One refill given. Please call patient to schedule an Annual with GYN visit. Thank you.    ED

## 2014-04-22 NOTE — Telephone Encounter (Signed)
Pt advised pt will call back to schedule appointment

## 2014-05-24 ENCOUNTER — Other Ambulatory Visit: Payer: Self-pay | Admitting: Family Medicine

## 2014-06-03 ENCOUNTER — Other Ambulatory Visit: Payer: Self-pay | Admitting: Family Medicine

## 2014-06-17 ENCOUNTER — Other Ambulatory Visit: Payer: Self-pay | Admitting: Family Medicine

## 2014-06-26 ENCOUNTER — Other Ambulatory Visit: Payer: Self-pay

## 2014-06-26 DIAGNOSIS — Z1231 Encounter for screening mammogram for malignant neoplasm of breast: Secondary | ICD-10-CM

## 2014-06-29 ENCOUNTER — Other Ambulatory Visit: Payer: Self-pay | Admitting: Family Medicine

## 2014-07-02 ENCOUNTER — Ambulatory Visit
Admission: RE | Admit: 2014-07-02 | Discharge: 2014-07-02 | Disposition: A | Discharge status: Home / Self Care | Payer: 59 | Source: Ambulatory Visit

## 2014-07-02 DIAGNOSIS — Z1231 Encounter for screening mammogram for malignant neoplasm of breast: Secondary | ICD-10-CM

## 2014-07-26 ENCOUNTER — Other Ambulatory Visit: Payer: Self-pay | Admitting: Family Medicine

## 2014-08-23 ENCOUNTER — Other Ambulatory Visit: Payer: Self-pay | Admitting: Family Medicine

## 2014-09-09 ENCOUNTER — Other Ambulatory Visit: Payer: Self-pay | Admitting: Family Medicine

## 2014-09-09 NOTE — Telephone Encounter (Signed)
  LOV and Labs- 08/29/14  Future appointment- 10/12/14 Last filled- 07/26/14 Fenofibrate 145 mcg QD #30 Rf# 0   Please advise

## 2014-10-12 ENCOUNTER — Ambulatory Visit (INDEPENDENT_AMBULATORY_CARE_PROVIDER_SITE_OTHER): Payer: 59 | Admitting: Family Medicine

## 2014-10-12 ENCOUNTER — Encounter: Payer: Self-pay | Admitting: Family Medicine

## 2014-10-12 ENCOUNTER — Other Ambulatory Visit (HOSPITAL_COMMUNITY)
Admission: RE | Admit: 2014-10-12 | Discharge: 2014-10-12 | Disposition: A | Discharge status: Home / Self Care | Payer: 59 | Source: Ambulatory Visit | Attending: Family Medicine | Admitting: Family Medicine

## 2014-10-12 VITALS — BP 130/80 | HR 82 | Temp 98.5°F | Ht 69.0 in | Wt 237.4 lb

## 2014-10-12 DIAGNOSIS — Z01419 Encounter for gynecological examination (general) (routine) without abnormal findings: Secondary | ICD-10-CM | POA: Diagnosis present

## 2014-10-12 DIAGNOSIS — I10 Essential (primary) hypertension: Secondary | ICD-10-CM

## 2014-10-12 DIAGNOSIS — T7840XA Allergy, unspecified, initial encounter: Secondary | ICD-10-CM

## 2014-10-12 DIAGNOSIS — Z124 Encounter for screening for malignant neoplasm of cervix: Secondary | ICD-10-CM | POA: Diagnosis not present

## 2014-10-12 DIAGNOSIS — F4321 Adjustment disorder with depressed mood: Secondary | ICD-10-CM | POA: Diagnosis not present

## 2014-10-12 DIAGNOSIS — Z0000 Encounter for general adult medical examination without abnormal findings: Secondary | ICD-10-CM | POA: Diagnosis not present

## 2014-10-12 DIAGNOSIS — E785 Hyperlipidemia, unspecified: Secondary | ICD-10-CM

## 2014-10-12 DIAGNOSIS — E669 Obesity, unspecified: Secondary | ICD-10-CM

## 2014-10-12 DIAGNOSIS — F419 Anxiety disorder, unspecified: Secondary | ICD-10-CM

## 2014-10-12 DIAGNOSIS — R0600 Dyspnea, unspecified: Secondary | ICD-10-CM

## 2014-10-12 LAB — COMPREHENSIVE METABOLIC PANEL
ALT: 24 U/L (ref 0–35)
AST: 19 U/L (ref 0–37)
Albumin: 4.3 g/dL (ref 3.5–5.2)
Alkaline Phosphatase: 58 U/L (ref 39–117)
BUN: 17 mg/dL (ref 6–23)
CALCIUM: 9.9 mg/dL (ref 8.4–10.5)
CHLORIDE: 103 meq/L (ref 96–112)
CO2: 28 meq/L (ref 19–32)
CREATININE: 0.62 mg/dL (ref 0.40–1.20)
GFR: 106.03 mL/min (ref >60.00)
Glucose, Bld: 84 mg/dL (ref 70–99)
POTASSIUM: 3.6 meq/L (ref 3.5–5.1)
Sodium: 142 mEq/L (ref 135–145)
Total Bilirubin: 0.5 mg/dL (ref 0.2–1.2)
Total Protein: 7.9 g/dL (ref 6.0–8.3)

## 2014-10-12 LAB — HEPATITIS C ANTIBODY: HCV Ab: NEGATIVE

## 2014-10-12 LAB — LIPID PANEL
CHOL/HDL RATIO: 3
Cholesterol: 160 mg/dL (ref 0–200)
HDL: 48.4 mg/dL (ref >39.00)
LDL Cholesterol: 87 mg/dL (ref 0–99)
NonHDL: 112.09
Triglycerides: 123 mg/dL (ref 0.0–149.0)
VLDL: 24.6 mg/dL (ref 0.0–40.0)

## 2014-10-12 LAB — CBC
HCT: 40.1 % (ref 36.0–46.0)
Hemoglobin: 13.6 g/dL (ref 12.0–15.0)
MCHC: 33.8 g/dL (ref 30.0–36.0)
MCV: 91.7 fl (ref 78.0–100.0)
Platelets: 396 10*3/uL (ref 150.0–400.0)
RBC: 4.37 Mil/uL (ref 3.87–5.11)
RDW: 13.1 % (ref 11.5–15.5)
WBC: 11.1 10*3/uL — ABNORMAL HIGH (ref 4.0–10.5)

## 2014-10-12 LAB — HIV ANTIBODY (ROUTINE TESTING W REFLEX): HIV: NONREACTIVE

## 2014-10-12 LAB — TSH: TSH: 1.51 u[IU]/mL (ref 0.35–4.50)

## 2014-10-12 MED ORDER — ALPRAZOLAM 1 MG PO TABS
ORAL_TABLET | ORAL | Status: DC
Start: 2014-10-12 — End: 2015-10-14

## 2014-10-12 MED ORDER — ALBUTEROL SULFATE HFA 108 (90 BASE) MCG/ACT IN AERS: 2.0000 | INHALATION_SPRAY | Freq: Four times a day (QID) | RESPIRATORY_TRACT | Status: DC | PRN

## 2014-10-12 MED ORDER — MONTELUKAST SODIUM 10 MG PO TABS: ORAL_TABLET | ORAL | Status: DC

## 2014-10-12 MED ORDER — BUDESONIDE 180 MCG/ACT IN AEPB: 2.0000 | INHALATION_SPRAY | Freq: Two times a day (BID) | RESPIRATORY_TRACT | Status: DC

## 2014-10-12 MED ORDER — FENOFIBRATE 145 MG PO TABS: ORAL_TABLET | ORAL | Status: DC

## 2014-10-12 MED ORDER — VALSARTAN-HYDROCHLOROTHIAZIDE 160-12.5 MG PO TABS: 1.0000 | ORAL_TABLET | Freq: Every day | ORAL | Status: DC

## 2014-10-12 NOTE — Assessment & Plan Note (Signed)
Pap today, no concerns on exam.  

## 2014-10-12 NOTE — Progress Notes (Signed)
Patient ID: Kathleen Church, female   DOB: 11-08-1960, 54 y.o.   MRN: [id number]   Subjective:    Patient ID: Kathleen Church, female    DOB: June 30, 1960, 54 y.o.   MRN: [id number]  Chief Complaint  Patient presents with  . Annual Exam    HPI Patient is in today for annual exam with Pap smear. She denies any GYN complaints. She has been struggling with some mild congestion and some intermittent knee pain. She is followed with Dr. Theda Sers at [facility] for her left knee pain and had an MRI last week. No falls or trauma. She is also noting some intermittent trouble with congestion and postnasal drip as well as some dyspnea when congestion is notable. No recent fever or acute illnesses. Denies CP/palp/SOB/HA/congestion/fevers/GI or GU c/o. Taking meds as prescribed  Past Medical History  Diagnosis Date  . Asthma, mild persistent     adult onset  . Measles as a child  . Mumps as a child  . Anxiety     when father passed  . Hypertension 2010  . Allergic state   . Obesity   . Preventative health care 02/13/2011  . Sinusitis 02/13/2011  . Cervical cancer screening 04/27/2011  . Hypokalemia 04/27/2011  . Edema 04/27/2011  . Hyperlipidemia 04/27/2011  . Plantar fasciitis, left 01/19/2013    Past Surgical History  Procedure Laterality Date  . Endometrial ablation  2006    menorraghia  . Knee surgery  2006    left knee  . Tonsillectomy    . Tubal ligation      Family History  Problem Relation Age of Onset  . Cancer Father     kidney  . Cancer Maternal Grandmother     bone  . Cancer Paternal Grandmother     breast  . Asthma Daughter   . Heart attack Paternal Grandfather   . Asthma Daughter     Social History   Social History  . Marital Status: Married    Spouse Name: N/A  . Number of Children: N/A  . Years of Education: N/A   Occupational History  . Not on file.   Social History Main Topics  . Smoking status: Never Smoker   . Smokeless tobacco: Never Used  .  Alcohol Use: No  . Drug Use: No  . Sexual Activity:    Partners: Male   Other Topics Concern  . Not on file   Social History Narrative    Outpatient Prescriptions Prior to Visit  Medication Sig Dispense Refill  . aspirin 81 MG tablet Take 81 mg by mouth daily.    . meloxicam (MOBIC) 15 MG tablet TAKE 1 TABLET BY MOUTH EVERY DAY 30 tablet 3  . metoprolol succinate (TOPROL-XL) 25 MG 24 hr tablet TAKE 1 TABLET BY MOUTH DAILY 90 tablet 1  . albuterol (PROVENTIL HFA;VENTOLIN HFA) 108 (90 BASE) MCG/ACT inhaler Inhale 2 puffs into the lungs every 6 (six) hours as needed. 3 Inhaler 1  . ALPRAZolam (XANAX) 1 MG tablet 1/2 to 1 tab po bid prn anxiety, palpitations 60 tablet 1  . budesonide (PULMICORT) 180 MCG/ACT inhaler Inhale 2 puffs into the lungs 2 (two) times daily. 3 Inhaler 1  . fenofibrate (TRICOR) 145 MG tablet TAKE 1 TABLET BY MOUTH EVERY DAY **NEEDS OV** 30 tablet 0  . montelukast (SINGULAIR) 10 MG tablet TAKE 1 TABLET (10 MG TOTAL) BY MOUTH AT BEDTIME. 90 tablet 1  . valsartan-hydrochlorothiazide (DIOVAN HCT) 160-12.5 MG per tablet Take  1 tablet by mouth daily. 90 tablet 3  . montelukast (SINGULAIR) 10 MG tablet TAKE 1 TABLET (10 MG TOTAL) BY MOUTH AT BEDTIME. 90 tablet 1   No facility-administered medications prior to visit.    Allergies  Allergen Reactions  . Codeine Palpitations and Rash    Review of Systems  Constitutional: Negative for fever and malaise/fatigue.  HENT: Negative for congestion.   Eyes: Negative for discharge.  Respiratory: Negative for shortness of breath.   Cardiovascular: Negative for chest pain, palpitations and leg swelling.  Gastrointestinal: Negative for nausea and abdominal pain.  Genitourinary: Negative for dysuria.  Musculoskeletal: Negative for falls.  Skin: Negative for rash.  Neurological: Negative for loss of consciousness and headaches.  Endo/Heme/Allergies: Negative for environmental allergies.  Psychiatric/Behavioral: Negative for  depression. The patient is not nervous/anxious.        Objective:    Physical Exam  Constitutional: She is oriented to person, place, and time. She appears well-developed and well-nourished. No distress.  HENT:  Head: Normocephalic and atraumatic.  Eyes: Conjunctivae are normal.  Neck: Neck supple. No thyromegaly present.  Cardiovascular: Normal rate, regular rhythm and normal heart sounds.   No murmur heard. Pulmonary/Chest: Effort normal and breath sounds normal. No respiratory distress.  Abdominal: Soft. Bowel sounds are normal. She exhibits no distension and no mass. There is no tenderness.  Genitourinary: Vagina normal and uterus normal. No vaginal discharge found.  Musculoskeletal: She exhibits no edema.  Lymphadenopathy:    She has no cervical adenopathy.  Neurological: She is alert and oriented to person, place, and time.  Skin: Skin is warm and dry.  Psychiatric: She has a normal mood and affect. Her behavior is normal.    BP 130/80 mmHg  Pulse 82  Temp(Src) 98.5 F (36.9 C) (Oral)  Ht 5' 9"  (1.753 m)  Wt 237 lb 6 oz (107.673 kg)  BMI 35.04 kg/m2  SpO2 97% Wt Readings from Last 3 Encounters:  10/12/14 237 lb 6 oz (107.673 kg)  08/28/13 234 lb (106.142 kg)  06/25/13 234 lb (106.142 kg)     Lab Results  Component Value Date   WBC 11.1* 10/12/2014   HGB 13.6 10/12/2014   HCT 40.1 10/12/2014   PLT 396.0 10/12/2014   GLUCOSE 84 10/12/2014   CHOL 160 10/12/2014   TRIG 123.0 10/12/2014   HDL 48.40 10/12/2014   LDLDIRECT 133.4 02/08/2011   LDLCALC 87 10/12/2014   ALT 24 10/12/2014   AST 19 10/12/2014   NA 142 10/12/2014   K 3.6 10/12/2014   CL 103 10/12/2014   CREATININE 0.62 10/12/2014   BUN 17 10/12/2014   CO2 28 10/12/2014   TSH 1.51 10/12/2014    Lab Results  Component Value Date   TSH 1.51 10/12/2014   Lab Results  Component Value Date   WBC 11.1* 10/12/2014   HGB 13.6 10/12/2014   HCT 40.1 10/12/2014   MCV 91.7 10/12/2014   PLT 396.0  10/12/2014   Lab Results  Component Value Date   NA 142 10/12/2014   K 3.6 10/12/2014   CO2 28 10/12/2014   GLUCOSE 84 10/12/2014   BUN 17 10/12/2014   CREATININE 0.62 10/12/2014   BILITOT 0.5 10/12/2014   ALKPHOS 58 10/12/2014   AST 19 10/12/2014   ALT 24 10/12/2014   PROT 7.9 10/12/2014   ALBUMIN 4.3 10/12/2014   CALCIUM 9.9 10/12/2014   GFR 106.03 10/12/2014   Lab Results  Component Value Date   CHOL 160 10/12/2014  Lab Results  Component Value Date   HDL 48.40 10/12/2014   Lab Results  Component Value Date   LDLCALC 87 10/12/2014   Lab Results  Component Value Date   TRIG 123.0 10/12/2014   Lab Results  Component Value Date   CHOLHDL 3 10/12/2014   No results found for: HGBA1C     Assessment & Plan:   Problem List Items Addressed This Visit    Preventative health care    Patient encouraged to maintain heart healthy diet, regular exercise, adequate sleep. Consider daily probiotics. Take medications as prescribed. Labs reviewed. Given and reviewed copy of ACP documents from [place] and encouraged to complete and return      Relevant Orders   Hepatitis C antibody (Completed)   HIV antibody (Completed)   Obesity    Encouraged DASH diet, decrease po intake and increase exercise as tolerated. Needs 7-8 hours of sleep nightly. Avoid trans fats, eat small, frequent meals every 4-5 hours with lean proteins, complex carbs and healthy fats. Minimize simple carbs, GMO foods.      Hypertension - Primary    Well controlled, no changes to meds. Encouraged heart healthy diet such as the DASH diet and exercise as tolerated.       Relevant Medications   valsartan-hydrochlorothiazide (DIOVAN HCT) 160-12.5 MG tablet   fenofibrate (TRICOR) 145 MG tablet   Other Relevant Orders   CBC (Completed)   TSH (Completed)   Lipid panel (Completed)   Comp Met (CMET) (Completed)   Hyperlipidemia    Encouraged heart healthy diet, increase exercise, avoid trans  fats, consider a krill oil cap daily. Tolerating Fenofibrate      Relevant Medications   valsartan-hydrochlorothiazide (DIOVAN HCT) 160-12.5 MG tablet   fenofibrate (TRICOR) 145 MG tablet   Grief reaction   Relevant Medications   ALPRAZolam (XANAX) 1 MG tablet   Other Relevant Orders   CBC (Completed)   TSH (Completed)   Lipid panel (Completed)   Comp Met (CMET) (Completed)   Cervical cancer screening    Pap today, no concerns on exam.       Relevant Orders   Cytology - PAP (Completed)   Anxiety    Uses Alprazolam sparingly given refill      Relevant Medications   ALPRAZolam (XANAX) 1 MG tablet   Allergic state   Relevant Medications   albuterol (PROVENTIL HFA;VENTOLIN HFA) 108 (90 BASE) MCG/ACT inhaler   Other Relevant Orders   CBC (Completed)   TSH (Completed)   Lipid panel (Completed)   Comp Met (CMET) (Completed)    Other Visit Diagnoses    Dyspnea        Relevant Orders    Echocardiogram       I have changed Ms. Buhman's valsartan-hydrochlorothiazide and fenofibrate. I am also having her maintain her aspirin, meloxicam, metoprolol succinate, montelukast, budesonide, albuterol, and ALPRAZolam.  Meds ordered this encounter  Medications  . valsartan-hydrochlorothiazide (DIOVAN HCT) 160-12.5 MG tablet    Sig: Take 1 tablet by mouth daily.    Dispense:  90 tablet    Refill:  3  . montelukast (SINGULAIR) 10 MG tablet    Sig: TAKE 1 TABLET (10 MG TOTAL) BY MOUTH AT BEDTIME.    Dispense:  90 tablet    Refill:  23  . fenofibrate (TRICOR) 145 MG tablet    Sig: 1 tab po daily    Dispense:  90 tablet    Refill:  3  . budesonide (PULMICORT) 180 MCG/ACT  inhaler    Sig: Inhale 2 puffs into the lungs 2 (two) times daily.    Dispense:  3 Inhaler    Refill:  11  . albuterol (PROVENTIL HFA;VENTOLIN HFA) 108 (90 BASE) MCG/ACT inhaler    Sig: Inhale 2 puffs into the lungs every 6 (six) hours as needed.    Dispense:  3 Inhaler    Refill:  11    Dispense ventolin HFA    . ALPRAZolam (XANAX) 1 MG tablet    Sig: 1/2 to 1 tab po bid prn anxiety, palpitations    Dispense:  60 tablet    Refill:  0     Penni Homans, MD

## 2014-10-12 NOTE — Patient Instructions (Signed)
Preventive Care for Adults A healthy lifestyle and preventive care can promote health and wellness. Preventive health guidelines for women include the following key practices.  A routine yearly physical is a good way to check with your health care provider about your health and preventive screening. It is a chance to share any concerns and updates on your health and to receive a thorough exam.  Visit your dentist for a routine exam and preventive care every 6 months. Brush your teeth twice a day and floss once a day. Good oral hygiene prevents tooth decay and gum disease.  The frequency of eye exams is based on your age, health, family medical history, use of contact lenses, and other factors. Follow your health care provider's recommendations for frequency of eye exams.  Eat a healthy diet. Foods like vegetables, fruits, whole grains, low-fat dairy products, and lean protein foods contain the nutrients you need without too many calories. Decrease your intake of foods high in solid fats, added sugars, and salt. Eat the right amount of calories for you.Get information about a proper diet from your health care provider, if necessary.  Regular physical exercise is one of the most important things you can do for your health. Most adults should get at least 150 minutes of moderate-intensity exercise (any activity that increases your heart rate and causes you to sweat) each week. In addition, most adults need muscle-strengthening exercises on 2 or more days a week.  Maintain a healthy weight. The body mass index (BMI) is a screening tool to identify possible weight problems. It provides an estimate of body fat based on height and weight. Your health care provider can find your BMI and can help you achieve or maintain a healthy weight.For adults 20 years and older:  A BMI below 18.5 is considered underweight.  A BMI of 18.5 to 24.9 is normal.  A BMI of 25 to 29.9 is considered overweight.  A BMI of  30 and above is considered obese.  Maintain normal blood lipids and cholesterol levels by exercising and minimizing your intake of saturated fat. Eat a balanced diet with plenty of fruit and vegetables. Blood tests for lipids and cholesterol should begin at age 65 and be repeated every 5 years. If your lipid or cholesterol levels are high, you are over 50, or you are at high risk for heart disease, you may need your cholesterol levels checked more frequently.Ongoing high lipid and cholesterol levels should be treated with medicines if diet and exercise are not working.  If you smoke, find out from your health care provider how to quit. If you do not use tobacco, do not start.  Lung cancer screening is recommended for adults aged 52-80 years who are at high risk for developing lung cancer because of a history of smoking. A yearly low-dose CT scan of the lungs is recommended for people who have at least a 30-pack-year history of smoking and are a current smoker or have quit within the past 15 years. A pack year of smoking is smoking an average of 1 pack of cigarettes a day for 1 year (for example: 1 pack a day for 30 years or 2 packs a day for 15 years). Yearly screening should continue until the smoker has stopped smoking for at least 15 years. Yearly screening should be stopped for people who develop a health problem that would prevent them from having lung cancer treatment.  If you are pregnant, do not drink alcohol. If you are breastfeeding,  be very cautious about drinking alcohol. If you are not pregnant and choose to drink alcohol, do not have more than 1 drink per day. One drink is considered to be 12 ounces (355 mL) of beer, 5 ounces (148 mL) of wine, or 1.5 ounces (44 mL) of liquor.  Avoid use of street drugs. Do not share needles with anyone. Ask for help if you need support or instructions about stopping the use of drugs.  High blood pressure causes heart disease and increases the risk of  stroke. Your blood pressure should be checked at least every 1 to 2 years. Ongoing high blood pressure should be treated with medicines if weight loss and exercise do not work.  If you are 33-26 years old, ask your health care provider if you should take aspirin to prevent strokes.  Diabetes screening involves taking a blood sample to check your fasting blood sugar level. This should be done once every 3 years, after age 35, if you are within normal weight and without risk factors for diabetes. Testing should be considered at a younger age or be carried out more frequently if you are overweight and have at least 1 risk factor for diabetes.  Breast cancer screening is essential preventive care for women. You should practice "breast self-awareness." This means understanding the normal appearance and feel of your breasts and may include breast self-examination. Any changes detected, no matter how small, should be reported to a health care provider. Women in their 55s and 30s should have a clinical breast exam (CBE) by a health care provider as part of a regular health exam every 1 to 3 years. After age 94, women should have a CBE every year. Starting at age 66, women should consider having a mammogram (breast X-ray test) every year. Women who have a family history of breast cancer should talk to their health care provider about genetic screening. Women at a high risk of breast cancer should talk to their health care providers about having an MRI and a mammogram every year.  Breast cancer gene (BRCA)-related cancer risk assessment is recommended for women who have family members with BRCA-related cancers. BRCA-related cancers include breast, ovarian, tubal, and peritoneal cancers. Having family members with these cancers may be associated with an increased risk for harmful changes (mutations) in the breast cancer genes BRCA1 and BRCA2. Results of the assessment will determine the need for genetic counseling and  BRCA1 and BRCA2 testing.  Routine pelvic exams to screen for cancer are no longer recommended for nonpregnant women who are considered low risk for cancer of the pelvic organs (ovaries, uterus, and vagina) and who do not have symptoms. Ask your health care provider if a screening pelvic exam is right for you.  If you have had past treatment for cervical cancer or a condition that could lead to cancer, you need Pap tests and screening for cancer for at least 20 years after your treatment. If Pap tests have been discontinued, your risk factors (such as having a new sexual partner) need to be reassessed to determine if screening should be resumed. Some women have medical problems that increase the chance of getting cervical cancer. In these cases, your health care provider may recommend more frequent screening and Pap tests.  The HPV test is an additional test that may be used for cervical cancer screening. The HPV test looks for the virus that can cause the cell changes on the cervix. The cells collected during the Pap test can be  tested for HPV. The HPV test could be used to screen women aged 30 years and older, and should be used in women of any age who have unclear Pap test results. After the age of 30, women should have HPV testing at the same frequency as a Pap test.  Colorectal cancer can be detected and often prevented. Most routine colorectal cancer screening begins at the age of 50 years and continues through age 75 years. However, your health care provider may recommend screening at an earlier age if you have risk factors for colon cancer. On a yearly basis, your health care provider may provide home test kits to check for hidden blood in the stool. Use of a small camera at the end of a tube, to directly examine the colon (sigmoidoscopy or colonoscopy), can detect the earliest forms of colorectal cancer. Talk to your health care provider about this at age 50, when routine screening begins. Direct  exam of the colon should be repeated every 5-10 years through age 75 years, unless early forms of pre-cancerous polyps or small growths are found.  People who are at an increased risk for hepatitis B should be screened for this virus. You are considered at high risk for hepatitis B if:  You were born in a country where hepatitis B occurs often. Talk with your health care provider about which countries are considered high risk.  Your parents were born in a high-risk country and you have not received a shot to protect against hepatitis B (hepatitis B vaccine).  You have HIV or AIDS.  You use needles to inject street drugs.  You live with, or have sex with, someone who has hepatitis B.  You get hemodialysis treatment.  You take certain medicines for conditions like cancer, organ transplantation, and autoimmune conditions.  Hepatitis C blood testing is recommended for all people born from 1945 through 1965 and any individual with known risks for hepatitis C.  Practice safe sex. Use condoms and avoid high-risk sexual practices to reduce the spread of sexually transmitted infections (STIs). STIs include gonorrhea, chlamydia, syphilis, trichomonas, herpes, HPV, and human immunodeficiency virus (HIV). Herpes, HIV, and HPV are viral illnesses that have no cure. They can result in disability, cancer, and death.  You should be screened for sexually transmitted illnesses (STIs) including gonorrhea and chlamydia if:  You are sexually active and are younger than 24 years.  You are older than 24 years and your health care provider tells you that you are at risk for this type of infection.  Your sexual activity has changed since you were last screened and you are at an increased risk for chlamydia or gonorrhea. Ask your health care provider if you are at risk.  If you are at risk of being infected with HIV, it is recommended that you take a prescription medicine daily to prevent HIV infection. This is  called preexposure prophylaxis (PrEP). You are considered at risk if:  You are a heterosexual woman, are sexually active, and are at increased risk for HIV infection.  You take drugs by injection.  You are sexually active with a partner who has HIV.  Talk with your health care provider about whether you are at high risk of being infected with HIV. If you choose to begin PrEP, you should first be tested for HIV. You should then be tested every 3 months for as long as you are taking PrEP.  Osteoporosis is a disease in which the bones lose minerals and strength   with aging. This can result in serious bone fractures or breaks. The risk of osteoporosis can be identified using a bone density scan. Women ages 78 years and over and women at risk for fractures or osteoporosis should discuss screening with their health care providers. Ask your health care provider whether you should take a calcium supplement or vitamin D to reduce the rate of osteoporosis.  Menopause can be associated with physical symptoms and risks. Hormone replacement therapy is available to decrease symptoms and risks. You should talk to your health care provider about whether hormone replacement therapy is right for you.  Use sunscreen. Apply sunscreen liberally and repeatedly throughout the day. You should seek shade when your shadow is shorter than you. Protect yourself by wearing long sleeves, pants, a wide-brimmed hat, and sunglasses year round, whenever you are outdoors.  Once a month, do a whole body skin exam, using a mirror to look at the skin on your back. Tell your health care provider of new moles, moles that have irregular borders, moles that are larger than a pencil eraser, or moles that have changed in shape or color.  Stay current with required vaccines (immunizations).  Influenza vaccine. All adults should be immunized every year.  Tetanus, diphtheria, and acellular pertussis (Td, Tdap) vaccine. Pregnant women should  receive 1 dose of Tdap vaccine during each pregnancy. The dose should be obtained regardless of the length of time since the last dose. Immunization is preferred during the 27th-36th week of gestation. An adult who has not previously received Tdap or who does not know her vaccine status should receive 1 dose of Tdap. This initial dose should be followed by tetanus and diphtheria toxoids (Td) booster doses every 10 years. Adults with an unknown or incomplete history of completing a 3-dose immunization series with Td-containing vaccines should begin or complete a primary immunization series including a Tdap dose. Adults should receive a Td booster every 10 years.  Varicella vaccine. An adult without evidence of immunity to varicella should receive 2 doses or a second dose if she has previously received 1 dose. Pregnant females who do not have evidence of immunity should receive the first dose after pregnancy. This first dose should be obtained before leaving the health care facility. The second dose should be obtained 4-8 weeks after the first dose.  Human papillomavirus (HPV) vaccine. Females aged 13-26 years who have not received the vaccine previously should obtain the 3-dose series. The vaccine is not recommended for use in pregnant females. However, pregnancy testing is not needed before receiving a dose. If a female is found to be pregnant after receiving a dose, no treatment is needed. In that case, the remaining doses should be delayed until after the pregnancy. Immunization is recommended for any person with an immunocompromised condition through the age of 59 years if she did not get any or all doses earlier. During the 3-dose series, the second dose should be obtained 4-8 weeks after the first dose. The third dose should be obtained 24 weeks after the first dose and 16 weeks after the second dose.  Zoster vaccine. One dose is recommended for adults aged 43 years or older unless certain conditions are  present.  Measles, mumps, and rubella (MMR) vaccine. Adults born before [birth year] generally are considered immune to measles and mumps. Adults born in [birth year] or later should have 1 or more doses of MMR vaccine unless there is a contraindication to the vaccine or there is laboratory evidence of immunity to  each of the three diseases. A routine second dose of MMR vaccine should be obtained at least 28 days after the first dose for students attending postsecondary schools, health care workers, or international travelers. People who received inactivated measles vaccine or an unknown type of measles vaccine during 1963-1967 should receive 2 doses of MMR vaccine. People who received inactivated mumps vaccine or an unknown type of mumps vaccine before 1979 and are at high risk for mumps infection should consider immunization with 2 doses of MMR vaccine. For females of childbearing age, rubella immunity should be determined. If there is no evidence of immunity, females who are not pregnant should be vaccinated. If there is no evidence of immunity, females who are pregnant should delay immunization until after pregnancy. Unvaccinated health care workers born before 1957 who lack laboratory evidence of measles, mumps, or rubella immunity or laboratory confirmation of disease should consider measles and mumps immunization with 2 doses of MMR vaccine or rubella immunization with 1 dose of MMR vaccine.  Pneumococcal 13-valent conjugate (PCV13) vaccine. When indicated, a person who is uncertain of her immunization history and has no record of immunization should receive the PCV13 vaccine. An adult aged 19 years or older who has certain medical conditions and has not been previously immunized should receive 1 dose of PCV13 vaccine. This PCV13 should be followed with a dose of pneumococcal polysaccharide (PPSV23) vaccine. The PPSV23 vaccine dose should be obtained at least 8 weeks after the dose of PCV13 vaccine. An adult aged 19  years or older who has certain medical conditions and previously received 1 or more doses of PPSV23 vaccine should receive 1 dose of PCV13. The PCV13 vaccine dose should be obtained 1 or more years after the last PPSV23 vaccine dose.  Pneumococcal polysaccharide (PPSV23) vaccine. When PCV13 is also indicated, PCV13 should be obtained first. All adults aged 65 years and older should be immunized. An adult younger than age 65 years who has certain medical conditions should be immunized. Any person who resides in a nursing home or long-term care facility should be immunized. An adult smoker should be immunized. People with an immunocompromised condition and certain other conditions should receive both PCV13 and PPSV23 vaccines. People with human immunodeficiency virus (HIV) infection should be immunized as soon as possible after diagnosis. Immunization during chemotherapy or radiation therapy should be avoided. Routine use of PPSV23 vaccine is not recommended for American Indians, [place] Natives, or people younger than 65 years unless there are medical conditions that require PPSV23 vaccine. When indicated, people who have unknown immunization and have no record of immunization should receive PPSV23 vaccine. One-time revaccination 5 years after the first dose of PPSV23 is recommended for people aged 19-64 years who have chronic kidney failure, nephrotic syndrome, asplenia, or immunocompromised conditions. People who received 1-2 doses of PPSV23 before age 65 years should receive another dose of PPSV23 vaccine at age 65 years or later if at least 5 years have passed since the previous dose. Doses of PPSV23 are not needed for people immunized with PPSV23 at or after age 65 years.  Meningococcal vaccine. Adults with asplenia or persistent complement component deficiencies should receive 2 doses of quadrivalent meningococcal conjugate (MenACWY-D) vaccine. The doses should be obtained at least 2 months apart.  Microbiologists working with certain meningococcal bacteria, military recruits, people at risk during an outbreak, and people who travel to or live in countries with a high rate of meningitis should be immunized. A first-year college student up through age   21 years who is living in a residence hall should receive a dose if she did not receive a dose on or after her 16th birthday. Adults who have certain high-risk conditions should receive one or more doses of vaccine.  Hepatitis A vaccine. Adults who wish to be protected from this disease, have certain high-risk conditions, work with hepatitis A-infected animals, work in hepatitis A research labs, or travel to or work in countries with a high rate of hepatitis A should be immunized. Adults who were previously unvaccinated and who anticipate close contact with an international adoptee during the first 60 days after arrival in the [place] from a country with a high rate of hepatitis A should be immunized.  Hepatitis B vaccine. Adults who wish to be protected from this disease, have certain high-risk conditions, may be exposed to blood or other infectious body fluids, are household contacts or sex partners of hepatitis B positive people, are clients or workers in certain care facilities, or travel to or work in countries with a high rate of hepatitis B should be immunized.  Haemophilus influenzae type b (Hib) vaccine. A previously unvaccinated person with asplenia or sickle cell disease or having a scheduled splenectomy should receive 1 dose of Hib vaccine. Regardless of previous immunization, a recipient of a hematopoietic stem cell transplant should receive a 3-dose series 6-12 months after her successful transplant. Hib vaccine is not recommended for adults with HIV infection. Preventive Services / Frequency Ages 64 to 68 years  Blood pressure check.** / Every 1 to 2 years.  Lipid and cholesterol check.** / Every 5 years beginning at age  22.  Clinical breast exam.** / Every 3 years for women in their 88s and 53s.  BRCA-related cancer risk assessment.** / For women who have family members with a BRCA-related cancer (breast, ovarian, tubal, or peritoneal cancers).  Pap test.** / Every 2 years from ages 90 through 51. Every 3 years starting at age 21 through age 56 or 3 with a history of 3 consecutive normal Pap tests.  HPV screening.** / Every 3 years from ages 24 through ages 1 to 46 with a history of 3 consecutive normal Pap tests.  Hepatitis C blood test.** / For any individual with known risks for hepatitis C.  Skin self-exam. / Monthly.  Influenza vaccine. / Every year.  Tetanus, diphtheria, and acellular pertussis (Tdap, Td) vaccine.** / Consult your health care provider. Pregnant women should receive 1 dose of Tdap vaccine during each pregnancy. 1 dose of Td every 10 years.  Varicella vaccine.** / Consult your health care provider. Pregnant females who do not have evidence of immunity should receive the first dose after pregnancy.  HPV vaccine. / 3 doses over 6 months, if 72 and younger. The vaccine is not recommended for use in pregnant females. However, pregnancy testing is not needed before receiving a dose.  Measles, mumps, rubella (MMR) vaccine.** / You need at least 1 dose of MMR if you were born in 1957 or later. You may also need a 2nd dose. For females of childbearing age, rubella immunity should be determined. If there is no evidence of immunity, females who are not pregnant should be vaccinated. If there is no evidence of immunity, females who are pregnant should delay immunization until after pregnancy.  Pneumococcal 13-valent conjugate (PCV13) vaccine.** / Consult your health care provider.  Pneumococcal polysaccharide (PPSV23) vaccine.** / 1 to 2 doses if you smoke cigarettes or if you have certain conditions.  Meningococcal vaccine.** /  1 dose if you are age 38 to 24 years and a [place] living in a residence hall, or have one of several medical conditions, you need to get vaccinated against meningococcal disease. You may also need additional booster doses.  Hepatitis A vaccine.** / Consult your health care provider.  Hepatitis B vaccine.** / Consult your health care provider.  Haemophilus influenzae type b (Hib) vaccine.** / Consult your health care provider. Ages 80 to 58 years  Blood pressure check.** / Every 1 to 2 years.  Lipid and cholesterol check.** / Every 5 years beginning at age 100 years.  Lung cancer screening. / Every year if you are aged 60-80 years and have a 30-pack-year history of smoking and currently smoke or have quit within the past 15 years. Yearly screening is stopped once you have quit smoking for at least 15 years or develop a health problem that would prevent you from having lung cancer treatment.  Clinical breast exam.** / Every year after age 76 years.  BRCA-related cancer risk assessment.** / For women who have family members with a BRCA-related cancer (breast, ovarian, tubal, or peritoneal cancers).  Mammogram.** / Every year beginning at age 3 years and continuing for as long as you are in good health. Consult with your health care provider.  Pap test.** / Every 3 years starting at age 48 years through age 43 or 29 years with a history of 3 consecutive normal Pap tests.  HPV screening.** / Every 3 years from ages 43 years through ages 68 to 57 years with a history of 3 consecutive normal Pap tests.  Fecal occult blood test (FOBT) of stool. / Every year beginning at age 1 years and continuing until age 4 years. You may not need to do this test if you get a colonoscopy every 10 years.  Flexible sigmoidoscopy or colonoscopy.** / Every 5 years for a flexible sigmoidoscopy or every 10 years for a colonoscopy beginning at age 30 years and continuing until age 5 years.  Hepatitis C blood test.** / For all people born from [birth year] through  1965 and any individual with known risks for hepatitis C.  Skin self-exam. / Monthly.  Influenza vaccine. / Every year.  Tetanus, diphtheria, and acellular pertussis (Tdap/Td) vaccine.** / Consult your health care provider. Pregnant women should receive 1 dose of Tdap vaccine during each pregnancy. 1 dose of Td every 10 years.  Varicella vaccine.** / Consult your health care provider. Pregnant females who do not have evidence of immunity should receive the first dose after pregnancy.  Zoster vaccine.** / 1 dose for adults aged 37 years or older.  Measles, mumps, rubella (MMR) vaccine.** / You need at least 1 dose of MMR if you were born in 1957 or later. You may also need a 2nd dose. For females of childbearing age, rubella immunity should be determined. If there is no evidence of immunity, females who are not pregnant should be vaccinated. If there is no evidence of immunity, females who are pregnant should delay immunization until after pregnancy.  Pneumococcal 13-valent conjugate (PCV13) vaccine.** / Consult your health care provider.  Pneumococcal polysaccharide (PPSV23) vaccine.** / 1 to 2 doses if you smoke cigarettes or if you have certain conditions.  Meningococcal vaccine.** / Consult your health care provider.  Hepatitis A vaccine.** / Consult your health care provider.  Hepatitis B vaccine.** / Consult your health care provider.  Haemophilus influenzae type b (Hib) vaccine.** / Consult your health care provider. Ages 48  years and over  Blood pressure check.** / Every 1 to 2 years.  Lipid and cholesterol check.** / Every 5 years beginning at age 45 years.  Lung cancer screening. / Every year if you are aged 73-80 years and have a 30-pack-year history of smoking and currently smoke or have quit within the past 15 years. Yearly screening is stopped once you have quit smoking for at least 15 years or develop a health problem that would prevent you from having lung cancer  treatment.  Clinical breast exam.** / Every year after age 18 years.  BRCA-related cancer risk assessment.** / For women who have family members with a BRCA-related cancer (breast, ovarian, tubal, or peritoneal cancers).  Mammogram.** / Every year beginning at age 32 years and continuing for as long as you are in good health. Consult with your health care provider.  Pap test.** / Every 3 years starting at age 69 years through age 61 or 67 years with 3 consecutive normal Pap tests. Testing can be stopped between 65 and 70 years with 3 consecutive normal Pap tests and no abnormal Pap or HPV tests in the past 10 years.  HPV screening.** / Every 3 years from ages 33 years through ages 44 or 74 years with a history of 3 consecutive normal Pap tests. Testing can be stopped between 65 and 70 years with 3 consecutive normal Pap tests and no abnormal Pap or HPV tests in the past 10 years.  Fecal occult blood test (FOBT) of stool. / Every year beginning at age 65 years and continuing until age 25 years. You may not need to do this test if you get a colonoscopy every 10 years.  Flexible sigmoidoscopy or colonoscopy.** / Every 5 years for a flexible sigmoidoscopy or every 10 years for a colonoscopy beginning at age 9 years and continuing until age 44 years.  Hepatitis C blood test.** / For all people born from [birth year] through 1966 and any individual with known risks for hepatitis C.  Osteoporosis screening.** / A one-time screening for women ages 81 years and over and women at risk for fractures or osteoporosis.  Skin self-exam. / Monthly.  Influenza vaccine. / Every year.  Tetanus, diphtheria, and acellular pertussis (Tdap/Td) vaccine.** / 1 dose of Td every 10 years.  Varicella vaccine.** / Consult your health care provider.  Zoster vaccine.** / 1 dose for adults aged 25 years or older.  Pneumococcal 13-valent conjugate (PCV13) vaccine.** / Consult your health care provider.  Pneumococcal  polysaccharide (PPSV23) vaccine.** / 1 dose for all adults aged 66 years and older.  Meningococcal vaccine.** / Consult your health care provider.  Hepatitis A vaccine.** / Consult your health care provider.  Hepatitis B vaccine.** / Consult your health care provider.  Haemophilus influenzae type b (Hib) vaccine.** / Consult your health care provider. ** Family history and personal history of risk and conditions may change your health care provider's recommendations. Document Released: 02/21/2001 Document Revised: 05/12/2013 Document Reviewed: 05/23/2010 [facility] Patient Information 2015 [place], [place]. This information is not intended to replace advice given to you by your health care provider. Make sure you discuss any questions you have with your health care provider.

## 2014-10-12 NOTE — Assessment & Plan Note (Signed)
Uses Alprazolam sparingly given refill

## 2014-10-13 LAB — CYTOLOGY - PAP

## 2014-10-18 NOTE — Assessment & Plan Note (Signed)
Patient encouraged to maintain heart healthy diet, regular exercise, adequate sleep. Consider daily probiotics. Take medications as prescribed. Labs reviewed. Given and reviewed copy of ACP documents from [place] Secretary [employer] State and encouraged to complete and return 

## 2014-10-18 NOTE — Assessment & Plan Note (Signed)
Encouraged DASH diet, decrease po intake and increase exercise as tolerated. Needs 7-8 hours of sleep nightly. Avoid trans fats, eat small, frequent meals every 4-5 hours with lean proteins, complex carbs and healthy fats. Minimize simple carbs, GMO foods. 

## 2014-10-18 NOTE — Assessment & Plan Note (Signed)
Encouraged heart healthy diet, increase exercise, avoid trans fats, consider a krill oil cap daily. Tolerating Fenofibrate. 

## 2014-10-18 NOTE — Assessment & Plan Note (Signed)
Well controlled, no changes to meds. Encouraged heart healthy diet such as the DASH diet and exercise as tolerated.  °

## 2014-10-21 ENCOUNTER — Other Ambulatory Visit (HOSPITAL_BASED_OUTPATIENT_CLINIC_OR_DEPARTMENT_OTHER): Payer: 59

## 2014-10-28 ENCOUNTER — Other Ambulatory Visit (HOSPITAL_BASED_OUTPATIENT_CLINIC_OR_DEPARTMENT_OTHER): Payer: 59

## 2014-11-04 ENCOUNTER — Ambulatory Visit (HOSPITAL_BASED_OUTPATIENT_CLINIC_OR_DEPARTMENT_OTHER)
Admission: RE | Admit: 2014-11-04 | Discharge: 2014-11-04 | Disposition: A | Discharge status: Home / Self Care | Payer: 59 | Source: Ambulatory Visit | Attending: Family Medicine | Admitting: Family Medicine

## 2014-11-04 DIAGNOSIS — I371 Nonrheumatic pulmonary valve insufficiency: Secondary | ICD-10-CM | POA: Insufficient documentation

## 2014-11-04 DIAGNOSIS — I517 Cardiomegaly: Secondary | ICD-10-CM | POA: Diagnosis not present

## 2014-11-04 DIAGNOSIS — I340 Nonrheumatic mitral (valve) insufficiency: Secondary | ICD-10-CM | POA: Insufficient documentation

## 2014-11-04 DIAGNOSIS — E785 Hyperlipidemia, unspecified: Secondary | ICD-10-CM | POA: Diagnosis not present

## 2014-11-04 DIAGNOSIS — I10 Essential (primary) hypertension: Secondary | ICD-10-CM | POA: Diagnosis not present

## 2014-11-04 DIAGNOSIS — R0600 Dyspnea, unspecified: Secondary | ICD-10-CM | POA: Insufficient documentation

## 2014-11-04 NOTE — Progress Notes (Signed)
Echocardiogram 2D Echocardiogram has been performed.  Kathleen Church, Kathleen Church M 11/04/2014, 12:03 PM

## 2014-11-27 ENCOUNTER — Other Ambulatory Visit: Payer: Self-pay | Admitting: Family Medicine

## 2015-01-08 ENCOUNTER — Telehealth: Payer: Self-pay | Admitting: Family Medicine

## 2015-01-08 NOTE — Telephone Encounter (Addendum)
Relation to WU:JWJXpt:self Call back number:[phone] Pharmacy: [id number], [place] - [address] [phone] (Phone) [phone] (Fax)         Reason for call:  As per pharmacy albuterol (PROVENTIL HFA;VENTOLIN HFA) 108 (90 BASE) MCG/ACT inhaler script has to state 90 day supply only or insurance will not cover.  Patient states if medication can please be revised before the new year or they will not cover.

## 2015-01-08 NOTE — Telephone Encounter (Signed)
Medication sent as 90 day/3 [facility]ers to [facility] [address] by Dr. Abner GreenspanBlyth in 10/2014 for 11 refills.

## 2015-01-08 NOTE — Telephone Encounter (Signed)
Left a message for call back.  

## 2015-01-15 NOTE — Telephone Encounter (Signed)
Encounter closed unless patient calls back.

## 2015-02-25 ENCOUNTER — Other Ambulatory Visit: Payer: Self-pay | Admitting: Family Medicine

## 2015-07-05 ENCOUNTER — Telehealth: Payer: Self-pay | Admitting: *Deleted

## 2015-07-05 NOTE — Telephone Encounter (Signed)
Please call pt and schedule a surgical clearance appt with Dr. Abner GreenspanBlyth. Thanks.

## 2015-07-06 NOTE — Telephone Encounter (Signed)
Appointment scheduled.

## 2015-07-14 NOTE — Telephone Encounter (Signed)
Form forwarded to Robin for appt on 08/05/15. JG//CMA

## 2015-08-05 ENCOUNTER — Ambulatory Visit (INDEPENDENT_AMBULATORY_CARE_PROVIDER_SITE_OTHER): Payer: 59 | Admitting: Medical

## 2015-08-05 ENCOUNTER — Ambulatory Visit: Payer: 59 | Admitting: Family Medicine

## 2015-08-05 ENCOUNTER — Telehealth: Payer: Self-pay | Admitting: Medical

## 2015-08-05 ENCOUNTER — Encounter: Payer: Self-pay | Admitting: Medical

## 2015-08-05 VITALS — BP 122/84 | HR 96 | Temp 98.5°F | Ht 68.5 in | Wt 239.2 lb

## 2015-08-05 DIAGNOSIS — E785 Hyperlipidemia, unspecified: Secondary | ICD-10-CM | POA: Diagnosis not present

## 2015-08-05 DIAGNOSIS — Z01818 Encounter for other preprocedural examination: Secondary | ICD-10-CM

## 2015-08-05 DIAGNOSIS — R000 Tachycardia, unspecified: Secondary | ICD-10-CM

## 2015-08-05 DIAGNOSIS — I10 Essential (primary) hypertension: Secondary | ICD-10-CM

## 2015-08-05 NOTE — Patient Instructions (Addendum)
Your physical exam was good today. Your ekg looked overall good but only one slight finding that indicates potential prior issue. With your family history I would like to find old ekg to compare to current ekg. Without one I would need to discuss with Dr. Abner Greenspan to see if we can clear without sending you to cardiologist.   I made copy of the form. Will let you know by early next week if can clear based on today ekg or if need to refer to cardiologist.  Follow up to be determined.  For pre-op labs I would recommend cbc and cmp about 7-10 days prior to surgery.

## 2015-08-05 NOTE — Progress Notes (Signed)
Subjective:    Patient ID: Kathleen Church, female    DOB: May 15, 1960, 55 y.o.   MRN: [id number]  HPI   Pt in for evaluation for surgery.   Pt states she will get total knee replacement. Pain with knee since 2006. Now told bone on bone. Pt states joint injection no longer help at all.  Pt had general anesthesia before in past. 2 years ago ankle surgery. Pt did not have any problems under general anesthesia.  Recovered well. Also in 2006 had knee surgery under general anesthesia did well.  On review no hx of  MI, strokes, bleeding disorders or DVT(or coag disorder).  Pt never a smoker.  Pt had some brief tachycardia in the past after her husband passed away. Pt was placed on b-blocker. Was given for anxiety per pt. Pt states no holter monitor done.  Pt had echo done this past year. Her ef was 60-65%. October 2016. Lipid panel in oct 2016 was good. Pt bp controlled today.  Pt states that she will likely get pre-op labs with ortho. They will start to discuss process with insurance company to get authorization.    Review of Systems  Constitutional: Negative for chills, fatigue and fever.  Respiratory: Negative for cough, chest tightness, shortness of breath and wheezing.   Cardiovascular: Negative for chest pain and palpitations.  Gastrointestinal: Negative for abdominal pain.  Musculoskeletal: Negative for back pain.  Hematological: Negative for adenopathy. Does not bruise/bleed easily.  Psychiatric/Behavioral: Negative for behavioral problems and confusion.    Past Medical History:  Diagnosis Date  . Allergic state   . Anxiety    when father passed  . Asthma, mild persistent    adult onset  . Cervical cancer screening 04/27/2011  . Edema 04/27/2011  . Hyperlipidemia 04/27/2011  . Hypertension 2010  . Hypokalemia 04/27/2011  . Measles as a child  . Mumps as a child  . Obesity   . Plantar fasciitis, left 01/19/2013  . Preventative health care 02/13/2011  . Sinusitis 02/13/2011       Social History   Social History  . Marital status: Married    Spouse name: N/A  . Number of children: N/A  . Years of education: N/A   Occupational History  . Not on file.   Social History Main Topics  . Smoking status: Never Smoker  . Smokeless tobacco: Never Used  . Alcohol use No  . Drug use: No  . Sexual activity: Yes    Partners: Male   Other Topics Concern  . Not on file   Social History Narrative  . No narrative on file    Past Surgical History:  Procedure Laterality Date  . ENDOMETRIAL ABLATION  2006   menorraghia  . KNEE SURGERY  2006   left knee  . TONSILLECTOMY    . TUBAL LIGATION      Family History  Problem Relation Age of Onset  . Cancer Father     kidney  . Cancer Maternal Grandmother     bone  . Cancer Paternal Grandmother     breast  . Asthma Daughter   . Heart attack Paternal Grandfather   . Asthma Daughter     Allergies  Allergen Reactions  . Codeine Palpitations and Rash    Current Outpatient Prescriptions on File Prior to Visit  Medication Sig Dispense Refill  . albuterol (PROVENTIL HFA;VENTOLIN HFA) 108 (90 BASE) MCG/ACT inhaler Inhale 2 puffs into the lungs every 6 (six) hours as needed.  3 Inhaler 11  . ALPRAZolam (XANAX) 1 MG tablet 1/2 to 1 tab po bid prn anxiety, palpitations 60 tablet 0  . aspirin 81 MG tablet Take 81 mg by mouth daily.    . budesonide (PULMICORT) 180 MCG/ACT inhaler Inhale 2 puffs into the lungs 2 (two) times daily. 3 Inhaler 11  . fenofibrate (TRICOR) 145 MG tablet 1 tab po daily 90 tablet 3  . meloxicam (MOBIC) 15 MG tablet TAKE 1 TABLET BY MOUTH EVERY DAY 30 tablet 3  . metoprolol succinate (TOPROL-XL) 25 MG 24 hr tablet TAKE 1 TABLET BY MOUTH DAILY 90 tablet 1  . montelukast (SINGULAIR) 10 MG tablet TAKE 1 TABLET (10 MG TOTAL) BY MOUTH AT BEDTIME. 90 tablet 23  . valsartan-hydrochlorothiazide (DIOVAN HCT) 160-12.5 MG tablet Take 1 tablet by mouth daily. 90 tablet 3  . valsartan-hydrochlorothiazide  (DIOVAN-HCT) 160-12.5 MG tablet TAKE 1 TABLET BY MOUTH DAILY. 90 tablet 3   No current facility-administered medications on file prior to visit.     BP 122/84 (BP Location: Left Arm, Patient Position: Sitting, Cuff Size: Large)   Pulse 96   Temp 98.5 F (36.9 C) (Oral)   Ht 5' 8.5" (1.74 m)   Wt 239 lb 4 oz (108.5 kg)   BMI 35.85 kg/m       Objective:   Physical Exam  General Mental Status- Alert. General Appearance- Not in acute distress.   Skin General: Color- Normal Color. Moisture- Normal Moisture.  Neck Carotid Arteries- Normal color. Moisture- Normal Moisture. No carotid bruits. No JVD.  Chest and Lung Exam Auscultation: Breath Sounds:-Normal. CTA  Cardiovascular Auscultation:Rythm- Regular. Murmurs & Other Heart Sounds:Auscultation of the heart reveals- No Murmurs.  Abdomen Inspection:-Inspeection Normal. Palpation/Percussion:Note:No mass. Palpation and Percussion of the abdomen reveal- Non Tender, Non Distended + BS, no rebound or guarding.  Neurologic Cranial Nerve exam:- CN III-XII intact(No nystagmus), symmetric smile. Strength:- 5/5 equal and symmetric strength both upper and lower extremities.  Left knee- moderate crepitus on palpation. Lower ext- faint 1+ edema. Negative  Homans signs.      Assessment & Plan:  Your physical exam was good today. Your ekg looked overall good but only one slight finding that indicates potential prior issue. With your family history I would like to find old ekg to compare to current ekg. Without one I would need to discuss with Dr. Abner Greenspan to see if we can clear without sending you to cardiologist.   I made copy of the form. Will let you know by early next week if can clear based on today ekg or if need to refer to cardiologist.  Follow up to be determined.  For pre-op labs I would recommend cbc and cmp about 7-10 days prior to surgery.  Shalini Mair, Ramon Dredge, PA-C

## 2015-08-05 NOTE — Telephone Encounter (Signed)
Dr. Abner Greenspan. I saw this pt for preop evaluation. I though likely could clear. If you could review my note and review ekg. No prior ekg to compare. +FH CAD in dad.  In light of FH and no ekg to compare wanted to run it by you before pt cleared. Or would you like her to see cardiologist.   By time you get this note, I will be on vacation. I put her form in your blue folder. Thanks.   Ramon Dredge

## 2015-08-05 NOTE — Progress Notes (Signed)
Pre visit review using our clinic review tool, if applicable. No additional management support is needed unless otherwise documented below in the visit note. 

## 2015-08-08 NOTE — Telephone Encounter (Signed)
Please let patient know I have reviewed her EKG and likely the abnormalities will prove to be nothing of consequence but with her FH and mild LVH would recommend cardiac clearance with a cardiologist. Please confirm she is OK with this and I will refer.

## 2015-08-09 ENCOUNTER — Other Ambulatory Visit: Payer: Self-pay | Admitting: Family Medicine

## 2015-08-09 ENCOUNTER — Telehealth: Payer: Self-pay

## 2015-08-09 DIAGNOSIS — R9431 Abnormal electrocardiogram [ECG] [EKG]: Secondary | ICD-10-CM

## 2015-08-09 DIAGNOSIS — I10 Essential (primary) hypertension: Secondary | ICD-10-CM

## 2015-08-09 NOTE — Telephone Encounter (Signed)
Called the patient informed of PCP instructions.  She is ok with a cardiology referral (asap please).

## 2015-08-09 NOTE — Telephone Encounter (Signed)
Referral placed per Dr. Abner Greenspan VO; [facility] [Kristy] given information RE: [facility] for appointment on Thursday; pt informed, understood/SLS 07/31

## 2015-08-09 NOTE — Telephone Encounter (Signed)
Patient would like referral for cardiology sent to [facility] cardiology they can see her this week per patient Dr. Jacinto Halim [phone] fax [phone] they are holding her a spot for 2:30 Thursday all they need is the referral. Please have referral coordinator call her when it is ready

## 2015-08-10 ENCOUNTER — Telehealth: Payer: Self-pay | Admitting: *Deleted

## 2015-08-10 NOTE — Telephone Encounter (Signed)
Received completed and signed Pre-Operative Clearance form from Dr. Abner Greenspan.   Form faxed to [facility] at [phone]).  Confirmation received.//AB/CMA

## 2015-08-13 ENCOUNTER — Encounter: Payer: Self-pay | Admitting: Family Medicine

## 2015-08-22 ENCOUNTER — Other Ambulatory Visit: Payer: Self-pay | Admitting: Family Medicine

## 2015-08-26 ENCOUNTER — Other Ambulatory Visit: Payer: Self-pay | Admitting: Family Medicine

## 2015-08-26 DIAGNOSIS — Z1231 Encounter for screening mammogram for malignant neoplasm of breast: Secondary | ICD-10-CM

## 2015-09-06 ENCOUNTER — Other Ambulatory Visit: Payer: Self-pay | Admitting: Orthopedic Surgery

## 2015-09-14 ENCOUNTER — Ambulatory Visit: Payer: 59

## 2015-09-23 ENCOUNTER — Ambulatory Visit
Admission: RE | Admit: 2015-09-23 | Discharge: 2015-09-23 | Disposition: A | Discharge status: Home / Self Care | Payer: 59 | Source: Ambulatory Visit | Attending: Family Medicine | Admitting: Family Medicine

## 2015-09-23 DIAGNOSIS — Z1231 Encounter for screening mammogram for malignant neoplasm of breast: Secondary | ICD-10-CM

## 2015-09-29 NOTE — H&P (Signed)
TOTAL KNEE ADMISSION H&P  Patient is being admitted for left total knee arthroplasty.  Subjective:  Chief Complaint:left knee pain.  HPI: Kathleen Church, 55 y.o. female, has a history of pain and functional disability in the left knee due to arthritis and has failed non-surgical conservative treatments for greater than 12 weeks to includecorticosteriod injections, flexibility and strengthening excercises, use of assistive devices, weight reduction as appropriate and activity modification.  Onset of symptoms was gradual, starting 6 years ago with gradually worsening course since that time. The patient noted no past surgery on the left knee(s).  Patient currently rates pain in the left knee(s) at 4 out of 10 with activity. Patient has worsening of pain with activity and weight bearing, pain that interferes with activities of daily living, pain with passive range of motion and joint swelling.  Patient has evidence of subchondral sclerosis, periarticular osteophytes and joint space narrowing by imaging studies. There is no active infection.  Patient Active Problem List   Diagnosis Date Noted  . Grief reaction 06/08/2013  . Tachycardia 06/08/2013  . Plantar fasciitis, left 01/19/2013  . Cervical cancer screening 04/27/2011  . Hypokalemia 04/27/2011  . Edema 04/27/2011  . Hyperlipidemia 04/27/2011  . Preventative health care 02/13/2011  . Sinusitis 02/13/2011  . Anxiety   . Hypertension   . Allergic state   . Obesity    Past Medical History:  Diagnosis Date  . Allergic state   . Anxiety    when father passed  . Asthma, mild persistent    adult onset  . Cervical cancer screening 04/27/2011  . Edema 04/27/2011  . Hyperlipidemia 04/27/2011  . Hypertension 2010  . Hypokalemia 04/27/2011  . Measles as a child  . Mumps as a child  . Obesity   . Plantar fasciitis, left 01/19/2013  . Preventative health care 02/13/2011  . Sinusitis 02/13/2011    Past Surgical History:  Procedure Laterality  Date  . ENDOMETRIAL ABLATION  2006   menorraghia  . KNEE SURGERY  2006   left knee  . TONSILLECTOMY    . TUBAL LIGATION      No prescriptions prior to admission.   Allergies  Allergen Reactions  . Codeine Palpitations and Rash    Social History  Substance Use Topics  . Smoking status: Never Smoker  . Smokeless tobacco: Never Used  . Alcohol use No    Family History  Problem Relation Age of Onset  . Cancer Father     kidney  . Cancer Maternal Grandmother     bone  . Cancer Paternal Grandmother     breast  . Asthma Daughter   . Heart attack Paternal Grandfather   . Asthma Daughter      Review of Systems  Constitutional: Negative.   HENT: Negative.   Eyes: Negative.   Respiratory: Negative.   Cardiovascular: Negative.   Gastrointestinal: Negative.   Genitourinary: Negative.   Musculoskeletal: Positive for joint pain.  Skin: Negative.   Neurological: Negative.   Endo/Heme/Allergies: Negative.   Psychiatric/Behavioral: Negative.     Objective:  Physical Exam  Constitutional: She is oriented to person, place, and time. She appears well-developed.  HENT:  Head: Normocephalic.  Eyes: EOM are normal.  Neck: Normal range of motion.  Cardiovascular: Normal rate and intact distal pulses.   Respiratory: Effort normal.  GI: Soft.  Genitourinary:  Genitourinary Comments: Deferred  Musculoskeletal:  Left knee fair ROM. LLE grossly n/v intact. Knee is stable.  Neurological: She is alert and oriented  to person, place, and time. She has normal reflexes.  Skin: Skin is warm and dry.  Psychiatric: Her behavior is normal.    Vital signs in last 24 hours:    Labs:   Estimated body mass index is 35.85 kg/m as calculated from the following:   Height as of 08/05/15: 5' 8.5" (1.74 m).   Weight as of 08/05/15: 108.5 kg (239 lb 4 oz).   Imaging Review Plain radiographs demonstrate severe degenerative joint disease of the left knee(s). The overall alignment ismild  varus. The bone quality appears to be good for age and reported activity level.  Assessment/Plan:  End stage arthritis, left knee   The patient history, physical examination, clinical judgment of the provider and imaging studies are consistent with end stage degenerative joint disease of the left knee(s) and total knee arthroplasty is deemed medically necessary. The treatment options including medical management, injection therapy arthroscopy and arthroplasty were discussed at length. The risks and benefits of total knee arthroplasty were presented and reviewed. The risks due to aseptic loosening, infection, stiffness, patella tracking problems, thromboembolic complications and other imponderables were discussed. The patient acknowledged the explanation, agreed to proceed with the plan and consent was signed. Patient is being admitted for inpatient treatment for surgery, pain control, PT, OT, prophylactic antibiotics, VTE prophylaxis, progressive ambulation and ADL's and discharge planning. The patient is planning to be discharged home with home health services.  Will use IV tranexamic acid. Contraindications and adverse affects of Tranexamic acid discussed in detail. Patient denies any of these at this time and understands the risks and benefits.

## 2015-10-07 NOTE — Patient Instructions (Signed)
Kathleen LericheKimberly K Liles  10/07/2015   Your procedure is scheduled on: 10/15/2015    Report to [facility]  Entrance [place]  elevators to 3rd floor to  Short Stay Center at   0530 AM.  Call this number if you have problems the morning of surgery [phone]   Remember: ONLY 1 PERSON MAY GO WITH YOU TO SHORT STAY TO GET  READY MORNING OF YOUR SURGERY.  Do not eat food or drink liquids :After Midnight.     Take these medicines the morning of surgery with A SIP OF WATER: Albuterol Inhaler if needed and bring, pulmicort Inhaler and bring, metoprolol ( Toprol)                                 You may not have any metal on your body including hair pins and              piercings  Do not wear jewelry, make-up, lotions, powders or perfumes, deodorant             Do not wear nail polish.  Do not shave  48 hours prior to surgery.                Do not bring valuables to the hospital. [place] IS NOT             RESPONSIBLE   FOR VALUABLES.  Contacts, dentures or bridgework may not be worn into surgery.  Leave suitcase in the car. After surgery it may be brought to your room.         Special Instructions: coughing and deep breathing exercises, leg exercises               Please read over the following fact sheets you were given: _____________________________________________________________________             [facility] - Preparing for Surgery Before surgery, you can play an important role.  Because skin is not sterile, your skin needs to be as free of germs as possible.  You can reduce the number of germs on your skin by washing with CHG (chlorahexidine gluconate) soap before surgery.  CHG is an antiseptic cleaner which kills germs and bonds with the skin to continue killing germs even after washing. Please DO NOT use if you have an allergy to CHG or antibacterial soaps.  If your skin becomes reddened/irritated stop using the CHG and inform your nurse when you  arrive at Short Stay. Do not shave (including legs and underarms) for at least 48 hours prior to the first CHG shower.  You may shave your face/neck. Please follow these instructions carefully:  1.  Shower with CHG Soap the night before surgery and the  morning of Surgery.  2.  If you choose to wash your hair, wash your hair first as usual with your  normal  shampoo.  3.  After you shampoo, rinse your hair and body thoroughly to remove the  shampoo.                           4.  Use CHG as you would any other liquid soap.  You can apply chg directly  to the skin and wash  Gently with a scrungie or clean washcloth.  5.  Apply the CHG Soap to your body ONLY FROM THE NECK DOWN.   Do not use on face/ open                           Wound or open sores. Avoid contact with eyes, ears mouth and genitals (private parts).                       Wash face,  Genitals (private parts) with your normal soap.             6.  Wash thoroughly, paying special attention to the area where your surgery  will be performed.  7.  Thoroughly rinse your body with warm water from the neck down.  8.  DO NOT shower/wash with your normal soap after using and rinsing off  the CHG Soap.                9.  Pat yourself dry with a clean towel.            10.  Wear clean pajamas.            11.  Place clean sheets on your bed the night of your first shower and do not  sleep with pets. Day of Surgery : Do not apply any lotions/deodorants the morning of surgery.  Please wear clean clothes to the hospital/surgery center.  FAILURE TO FOLLOW THESE INSTRUCTIONS MAY RESULT IN THE CANCELLATION OF YOUR SURGERY PATIENT SIGNATURE_________________________________  NURSE SIGNATURE__________________________________  ________________________________________________________________________  WHAT IS A BLOOD TRANSFUSION? Blood Transfusion Information  A transfusion is the replacement of blood or some of its parts. Blood  is made up of multiple cells which provide different functions.  Red blood cells carry oxygen and are used for blood loss replacement.  White blood cells fight against infection.  Platelets control bleeding.  Plasma helps clot blood.  Other blood products are available for specialized needs, such as hemophilia or other clotting disorders. BEFORE THE TRANSFUSION  Who gives blood for transfusions?   Healthy volunteers who are fully evaluated to make sure their blood is safe. This is blood bank blood. Transfusion therapy is the safest it has ever been in the practice of medicine. Before blood is taken from a donor, a complete history is taken to make sure that person has no history of diseases nor engages in risky social behavior (examples are intravenous drug use or sexual activity with multiple partners). The donor's travel history is screened to minimize risk of transmitting infections, such as malaria. The donated blood is tested for signs of infectious diseases, such as HIV and hepatitis. The blood is then tested to be sure it is compatible with you in order to minimize the chance of a transfusion reaction. If you or a relative donates blood, this is often done in anticipation of surgery and is not appropriate for emergency situations. It takes many days to process the donated blood. RISKS AND COMPLICATIONS Although transfusion therapy is very safe and saves many lives, the main dangers of transfusion include:   Getting an infectious disease.  Developing a transfusion reaction. This is an allergic reaction to something in the blood you were given. Every precaution is taken to prevent this. The decision to have a blood transfusion has been considered carefully by your caregiver before blood is given. Blood is not given unless the benefits outweigh  the risks. AFTER THE TRANSFUSION  Right after receiving a blood transfusion, you will usually feel much better and more energetic. This is  especially true if your red blood cells have gotten low (anemic). The transfusion raises the level of the red blood cells which carry oxygen, and this usually causes an energy increase.  The nurse administering the transfusion will monitor you carefully for complications. HOME CARE INSTRUCTIONS  No special instructions are needed after a transfusion. You may find your energy is better. Speak with your caregiver about any limitations on activity for underlying diseases you may have. SEEK MEDICAL CARE IF:   Your condition is not improving after your transfusion.  You develop redness or irritation at the intravenous (IV) site. SEEK IMMEDIATE MEDICAL CARE IF:  Any of the following symptoms occur over the next 12 hours:  Shaking chills.  You have a temperature by mouth above 102 F (38.9 C), not controlled by medicine.  Chest, back, or muscle pain.  People around you feel you are not acting correctly or are confused.  Shortness of breath or difficulty breathing.  Dizziness and fainting.  You get a rash or develop hives.  You have a decrease in urine output.  Your urine turns a dark color or changes to pink, red, or brown. Any of the following symptoms occur over the next 10 days:  You have a temperature by mouth above 102 F (38.9 C), not controlled by medicine.  Shortness of breath.  Weakness after normal activity.  The white part of the eye turns yellow (jaundice).  You have a decrease in the amount of urine or are urinating less often.  Your urine turns a dark color or changes to pink, red, or brown. Document Released: 12/24/1999 Document Revised: 03/20/2011 Document Reviewed: 08/12/2007 ExitCare Patient Information 2014 [place].  _______________________________________________________________________  Incentive Spirometer  An incentive spirometer is a tool that can help keep your lungs clear and active. This tool measures how well you are filling your lungs  with each breath. Taking long deep breaths may help reverse or decrease the chance of developing breathing (pulmonary) problems (especially infection) following:  A long period of time when you are unable to move or be active. BEFORE THE PROCEDURE   If the spirometer includes an indicator to show your best effort, your nurse or respiratory therapist will set it to a desired goal.  If possible, sit up straight or lean slightly forward. Try not to slouch.  Hold the incentive spirometer in an upright position. INSTRUCTIONS FOR USE  1. Sit on the edge of your bed if possible, or sit up as far as you can in bed or on a chair. 2. Hold the incentive spirometer in an upright position. 3. Breathe out normally. 4. Place the mouthpiece in your mouth and seal your lips tightly around it. 5. Breathe in slowly and as deeply as possible, raising the piston or the ball toward the top of the column. 6. Hold your breath for 3-5 seconds or for as long as possible. Allow the piston or ball to fall to the bottom of the column. 7. Remove the mouthpiece from your mouth and breathe out normally. 8. Rest for a few seconds and repeat Steps 1 through 7 at least 10 times every 1-2 hours when you are awake. Take your time and take a few normal breaths between deep breaths. 9. The spirometer may include an indicator to show your best effort. Use the indicator as a goal to work  toward during each repetition. 10. After each set of 10 deep breaths, practice coughing to be sure your lungs are clear. If you have an incision (the cut made at the time of surgery), support your incision when coughing by placing a pillow or rolled up towels firmly against it. Once you are able to get out of bed, walk around indoors and cough well. You may stop using the incentive spirometer when instructed by your caregiver.  RISKS AND COMPLICATIONS  Take your time so you do not get dizzy or light-headed.  If you are in pain, you may need to  take or ask for pain medication before doing incentive spirometry. It is harder to take a deep breath if you are having pain. AFTER USE  Rest and breathe slowly and easily.  It can be helpful to keep track of a log of your progress. Your caregiver can provide you with a simple table to help with this. If you are using the spirometer at home, follow these instructions: [place] IF:   You are having difficultly using the spirometer.  You have trouble using the spirometer as often as instructed.  Your pain medication is not giving enough relief while using the spirometer.  You develop fever of 100.5 F (38.1 C) or higher. SEEK IMMEDIATE MEDICAL CARE IF:   You cough up bloody sputum that had not been present before.  You develop fever of 102 F (38.9 C) or greater.  You develop worsening pain at or near the incision site. MAKE SURE YOU:   Understand these instructions.  Will watch your condition.  Will get help right away if you are not doing well or get worse. Document Released: 05/08/2006 Document Revised: 03/20/2011 Document Reviewed: 07/09/2006 [facility] Patient Information 2014 [place], [place].   ________________________________________________________________________

## 2015-10-08 ENCOUNTER — Encounter (HOSPITAL_COMMUNITY): Payer: Self-pay

## 2015-10-08 DIAGNOSIS — Z01818 Encounter for other preprocedural examination: Secondary | ICD-10-CM | POA: Diagnosis present

## 2015-10-08 LAB — BASIC METABOLIC PANEL
ANION GAP: 10 (ref 5–15)
BUN: 18 mg/dL (ref 6–20)
CHLORIDE: 105 mmol/L (ref 101–111)
CO2: 26 mmol/L (ref 22–32)
Calcium: 9.5 mg/dL (ref 8.9–10.3)
Creatinine, Ser: 0.68 mg/dL (ref 0.44–1.00)
GFR calc Af Amer: >60 mL/min (ref >60)
GLUCOSE: 86 mg/dL (ref 65–99)
POTASSIUM: 3.2 mmol/L — AB (ref 3.5–5.1)
Sodium: 141 mmol/L (ref 135–145)

## 2015-10-08 LAB — CBC
HEMATOCRIT: 40.4 % (ref 36.0–46.0)
HEMOGLOBIN: 13.3 g/dL (ref 12.0–15.0)
MCH: 31 pg (ref 26.0–34.0)
MCHC: 32.9 g/dL (ref 30.0–36.0)
MCV: 94.2 fL (ref 78.0–100.0)
Platelets: 392 10*3/uL (ref 150–400)
RBC: 4.29 MIL/uL (ref 3.87–5.11)
RDW: 12.9 % (ref 11.5–15.5)
WBC: 7.7 10*3/uL (ref 4.0–10.5)

## 2015-10-08 LAB — URINE MICROSCOPIC-ADD ON

## 2015-10-08 LAB — URINALYSIS, ROUTINE W REFLEX MICROSCOPIC
BILIRUBIN URINE: NEGATIVE
Glucose, UA: NEGATIVE mg/dL
Ketones, ur: NEGATIVE mg/dL
NITRITE: NEGATIVE
PH: 6.5 (ref 5.0–8.0)
Protein, ur: NEGATIVE mg/dL
SPECIFIC GRAVITY, URINE: 1.02 (ref 1.005–1.030)

## 2015-10-08 LAB — SURGICAL PCR SCREEN
MRSA, PCR: NEGATIVE
STAPHYLOCOCCUS AUREUS: NEGATIVE

## 2015-10-08 LAB — ABO/RH: ABO/RH(D): A POS

## 2015-10-08 LAB — APTT: APTT: 35 s (ref 24–36)

## 2015-10-08 LAB — PROTIME-INR
INR: 1.03
Prothrombin Time: 13.5 seconds (ref 11.4–15.2)

## 2015-10-08 LAB — HCG, SERUM, QUALITATIVE: PREG SERUM: POSITIVE — AB

## 2015-10-08 NOTE — Progress Notes (Signed)
U/A with micro and BMP done 10/08/15 faxed via EPIC to Dr Thomasena Edisollins.

## 2015-10-08 NOTE — Progress Notes (Signed)
DR Thomasena Edisollins' assistant called back and wanted to know what to do with the " weak positive " pregnancy test.  I told them I would have the patient come back for a urine pregnancy ( patient is coming on 10/11/15 at 0800am ) to repeat urine  pregnancy.  Patient called and made aware of lab results and states her husband has been dead for 2 years and there is no way she could be pregnant.  I told her the lab was probably in error but we would need to repeat.  Patient voiced understanding.

## 2015-10-08 NOTE — Progress Notes (Signed)
Clearance- Dr Jacinto HalimGanji- 08/15/15- on chart 08/12/15- EKG on chart 08/12/15- LOV- Dr Jacinto HalimGanji on chart 08/13/15- Stress test on chart

## 2015-10-08 NOTE — Progress Notes (Signed)
Called [facility] and spoke with Junious Dresseronnie at office and she will send Dr Thomasena Edisollins a message regarding " weak positive " on serum preg .  BMP, U/A and micro along with serum preg results faxed via EPIC to Dr Thomasena Edisollins at [phone].

## 2015-10-11 ENCOUNTER — Encounter (HOSPITAL_COMMUNITY)
Admission: RE | Admit: 2015-10-11 | Discharge: 2015-10-11 | Disposition: A | Discharge status: Home / Self Care | Payer: 59 | Source: Ambulatory Visit | Attending: Specialist | Admitting: Specialist

## 2015-10-11 DIAGNOSIS — Z01818 Encounter for other preprocedural examination: Secondary | ICD-10-CM | POA: Insufficient documentation

## 2015-10-11 HISTORY — DX: Unspecified osteoarthritis, unspecified site: M19.90

## 2015-10-11 HISTORY — DX: Cardiac murmur, unspecified: R01.1

## 2015-10-11 LAB — PREGNANCY, URINE: PREG TEST UR: NEGATIVE

## 2015-10-11 NOTE — Progress Notes (Signed)
Urine pregnancy done since serum pregnancy done on 10/08/2015 was " weak positive. ".  Results routed via EPIC to Dr Thomasena Edisollins.

## 2015-10-14 ENCOUNTER — Ambulatory Visit (INDEPENDENT_AMBULATORY_CARE_PROVIDER_SITE_OTHER): Payer: 59 | Admitting: Family Medicine

## 2015-10-14 ENCOUNTER — Encounter: Payer: Self-pay | Admitting: Family Medicine

## 2015-10-14 VITALS — BP 122/82 | HR 88 | Temp 98.5°F | Ht 68.0 in | Wt 241.2 lb

## 2015-10-14 DIAGNOSIS — M1712 Unilateral primary osteoarthritis, left knee: Secondary | ICD-10-CM | POA: Insufficient documentation

## 2015-10-14 DIAGNOSIS — M1711 Unilateral primary osteoarthritis, right knee: Secondary | ICD-10-CM | POA: Insufficient documentation

## 2015-10-14 DIAGNOSIS — E785 Hyperlipidemia, unspecified: Secondary | ICD-10-CM | POA: Diagnosis not present

## 2015-10-14 DIAGNOSIS — R000 Tachycardia, unspecified: Secondary | ICD-10-CM | POA: Diagnosis not present

## 2015-10-14 DIAGNOSIS — Z0000 Encounter for general adult medical examination without abnormal findings: Secondary | ICD-10-CM

## 2015-10-14 DIAGNOSIS — I10 Essential (primary) hypertension: Secondary | ICD-10-CM

## 2015-10-14 DIAGNOSIS — E876 Hypokalemia: Secondary | ICD-10-CM

## 2015-10-14 DIAGNOSIS — R252 Cramp and spasm: Secondary | ICD-10-CM

## 2015-10-14 DIAGNOSIS — R609 Edema, unspecified: Secondary | ICD-10-CM

## 2015-10-14 HISTORY — DX: Cramp and spasm: R25.2

## 2015-10-14 HISTORY — DX: Unilateral primary osteoarthritis, left knee: M17.12

## 2015-10-14 LAB — LIPID PANEL
CHOLESTEROL: 170 mg/dL (ref 0–200)
HDL: 44.3 mg/dL (ref >39.00)
LDL Cholesterol: 96 mg/dL (ref 0–99)
NONHDL: 125.29
Total CHOL/HDL Ratio: 4
Triglycerides: 147 mg/dL (ref 0.0–149.0)
VLDL: 29.4 mg/dL (ref 0.0–40.0)

## 2015-10-14 LAB — COMPREHENSIVE METABOLIC PANEL
ALBUMIN: 4.1 g/dL (ref 3.5–5.2)
ALT: 28 U/L (ref 0–35)
AST: 23 U/L (ref 0–37)
Alkaline Phosphatase: 61 U/L (ref 39–117)
BILIRUBIN TOTAL: 0.5 mg/dL (ref 0.2–1.2)
BUN: 15 mg/dL (ref 6–23)
CO2: 31 meq/L (ref 19–32)
Calcium: 9.5 mg/dL (ref 8.4–10.5)
Chloride: 104 mEq/L (ref 96–112)
Creatinine, Ser: 0.68 mg/dL (ref 0.40–1.20)
GFR: 94.96 mL/min (ref >60.00)
GLUCOSE: 91 mg/dL (ref 70–99)
POTASSIUM: 3.9 meq/L (ref 3.5–5.1)
SODIUM: 144 meq/L (ref 135–145)
TOTAL PROTEIN: 7.9 g/dL (ref 6.0–8.3)

## 2015-10-14 LAB — CBC
HEMATOCRIT: 39.8 % (ref 36.0–46.0)
HEMOGLOBIN: 13.5 g/dL (ref 12.0–15.0)
MCHC: 34 g/dL (ref 30.0–36.0)
MCV: 91.3 fl (ref 78.0–100.0)
Platelets: 417 10*3/uL — ABNORMAL HIGH (ref 150.0–400.0)
RBC: 4.36 Mil/uL (ref 3.87–5.11)
RDW: 12.7 % (ref 11.5–15.5)
WBC: 9.6 10*3/uL (ref 4.0–10.5)

## 2015-10-14 LAB — TSH: TSH: 1.1 u[IU]/mL (ref 0.35–4.50)

## 2015-10-14 NOTE — Patient Instructions (Signed)
Hyland's leg cramp med, hydrate and tonic water  Preventive Care for Adults, Female A healthy lifestyle and preventive care can promote health and wellness. Preventive health guidelines for women include the following key practices.  A routine yearly physical is a good way to check with your health care provider about your health and preventive screening. It is a chance to share any concerns and updates on your health and to receive a thorough exam.  Visit your dentist for a routine exam and preventive care every 6 months. Brush your teeth twice a day and floss once a day. Good oral hygiene prevents tooth decay and gum disease.  The frequency of eye exams is based on your age, health, family medical history, use of contact lenses, and other factors. Follow your health care provider's recommendations for frequency of eye exams.  Eat a healthy diet. Foods like vegetables, fruits, whole grains, low-fat dairy products, and lean protein foods contain the nutrients you need without too many calories. Decrease your intake of foods high in solid fats, added sugars, and salt. Eat the right amount of calories for you.Get information about a proper diet from your health care provider, if necessary.  Regular physical exercise is one of the most important things you can do for your health. Most adults should get at least 150 minutes of moderate-intensity exercise (any activity that increases your heart rate and causes you to sweat) each week. In addition, most adults need muscle-strengthening exercises on 2 or more days a week.  Maintain a healthy weight. The body mass index (BMI) is a screening tool to identify possible weight problems. It provides an estimate of body fat based on height and weight. Your health care provider can find your BMI and can help you achieve or maintain a healthy weight.For adults 20 years and older:  A BMI below 18.5 is considered underweight.  A BMI of 18.5 to 24.9 is  normal.  A BMI of 25 to 29.9 is considered overweight.  A BMI of 30 and above is considered obese.  Maintain normal blood lipids and cholesterol levels by exercising and minimizing your intake of saturated fat. Eat a balanced diet with plenty of fruit and vegetables. Blood tests for lipids and cholesterol should begin at age 13 and be repeated every 5 years. If your lipid or cholesterol levels are high, you are over 50, or you are at high risk for heart disease, you may need your cholesterol levels checked more frequently.Ongoing high lipid and cholesterol levels should be treated with medicines if diet and exercise are not working.  If you smoke, find out from your health care provider how to quit. If you do not use tobacco, do not start.  Lung cancer screening is recommended for adults aged 6-80 years who are at high risk for developing lung cancer because of a history of smoking. A yearly low-dose CT scan of the lungs is recommended for people who have at least a 30-pack-year history of smoking and are a current smoker or have quit within the past 15 years. A pack year of smoking is smoking an average of 1 pack of cigarettes a day for 1 year (for example: 1 pack a day for 30 years or 2 packs a day for 15 years). Yearly screening should continue until the smoker has stopped smoking for at least 15 years. Yearly screening should be stopped for people who develop a health problem that would prevent them from having lung cancer treatment.  If you  are pregnant, do not drink alcohol. If you are breastfeeding, be very cautious about drinking alcohol. If you are not pregnant and choose to drink alcohol, do not have more than 1 drink per day. One drink is considered to be 12 ounces (355 mL) of beer, 5 ounces (148 mL) of wine, or 1.5 ounces (44 mL) of liquor.  Avoid use of street drugs. Do not share needles with anyone. Ask for help if you need support or instructions about stopping the use of  drugs.  High blood pressure causes heart disease and increases the risk of stroke. Your blood pressure should be checked at least every 1 to 2 years. Ongoing high blood pressure should be treated with medicines if weight loss and exercise do not work.  If you are 48-70 years old, ask your health care provider if you should take aspirin to prevent strokes.  Diabetes screening is done by taking a blood sample to check your blood glucose level after you have not eaten for a certain period of time (fasting). If you are not overweight and you do not have risk factors for diabetes, you should be screened once every 3 years starting at age 59. If you are overweight or obese and you are 98-49 years of age, you should be screened for diabetes every year as part of your cardiovascular risk assessment.  Breast cancer screening is essential preventive care for women. You should practice "breast self-awareness." This means understanding the normal appearance and feel of your breasts and may include breast self-examination. Any changes detected, no matter how small, should be reported to a health care provider. Women in their 28s and 30s should have a clinical breast exam (CBE) by a health care provider as part of a regular health exam every 1 to 3 years. After age 6, women should have a CBE every year. Starting at age 23, women should consider having a mammogram (breast X-ray test) every year. Women who have a family history of breast cancer should talk to their health care provider about genetic screening. Women at a high risk of breast cancer should talk to their health care providers about having an MRI and a mammogram every year.  Breast cancer gene (BRCA)-related cancer risk assessment is recommended for women who have family members with BRCA-related cancers. BRCA-related cancers include breast, ovarian, tubal, and peritoneal cancers. Having family members with these cancers may be associated with an increased  risk for harmful changes (mutations) in the breast cancer genes BRCA1 and BRCA2. Results of the assessment will determine the need for genetic counseling and BRCA1 and BRCA2 testing.  Your health care provider may recommend that you be screened regularly for cancer of the pelvic organs (ovaries, uterus, and vagina). This screening involves a pelvic examination, including checking for microscopic changes to the surface of your cervix (Pap test). You may be encouraged to have this screening done every 3 years, beginning at age 2.  For women ages 32-65, health care providers may recommend pelvic exams and Pap testing every 3 years, or they may recommend the Pap and pelvic exam, combined with testing for human papilloma virus (HPV), every 5 years. Some types of HPV increase your risk of cervical cancer. Testing for HPV may also be done on women of any age with unclear Pap test results.  Other health care providers may not recommend any screening for nonpregnant women who are considered low risk for pelvic cancer and who do not have symptoms. Ask your health care  provider if a screening pelvic exam is right for you.  If you have had past treatment for cervical cancer or a condition that could lead to cancer, you need Pap tests and screening for cancer for at least 20 years after your treatment. If Pap tests have been discontinued, your risk factors (such as having a new sexual partner) need to be reassessed to determine if screening should resume. Some women have medical problems that increase the chance of getting cervical cancer. In these cases, your health care provider may recommend more frequent screening and Pap tests.  Colorectal cancer can be detected and often prevented. Most routine colorectal cancer screening begins at the age of 86 years and continues through age 46 years. However, your health care provider may recommend screening at an earlier age if you have risk factors for colon cancer. On a  yearly basis, your health care provider may provide home test kits to check for hidden blood in the stool. Use of a small camera at the end of a tube, to directly examine the colon (sigmoidoscopy or colonoscopy), can detect the earliest forms of colorectal cancer. Talk to your health care provider about this at age 73, when routine screening begins. Direct exam of the colon should be repeated every 5-10 years through age 87 years, unless early forms of precancerous polyps or small growths are found.  People who are at an increased risk for hepatitis B should be screened for this virus. You are considered at high risk for hepatitis B if:  You were born in a country where hepatitis B occurs often. Talk with your health care provider about which countries are considered high risk.  Your parents were born in a high-risk country and you have not received a shot to protect against hepatitis B (hepatitis B vaccine).  You have HIV or AIDS.  You use needles to inject street drugs.  You live with, or have sex with, someone who has hepatitis B.  You get hemodialysis treatment.  You take certain medicines for conditions like cancer, organ transplantation, and autoimmune conditions.  Hepatitis C blood testing is recommended for all people born from [birth year] through 1965 and any individual with known risks for hepatitis C.  Practice safe sex. Use condoms and avoid high-risk sexual practices to reduce the spread of sexually transmitted infections (STIs). STIs include gonorrhea, chlamydia, syphilis, trichomonas, herpes, HPV, and human immunodeficiency virus (HIV). Herpes, HIV, and HPV are viral illnesses that have no cure. They can result in disability, cancer, and death.  You should be screened for sexually transmitted illnesses (STIs) including gonorrhea and chlamydia if:  You are sexually active and are younger than 24 years.  You are older than 24 years and your health care provider tells you that you are  at risk for this type of infection.  Your sexual activity has changed since you were last screened and you are at an increased risk for chlamydia or gonorrhea. Ask your health care provider if you are at risk.  If you are at risk of being infected with HIV, it is recommended that you take a prescription medicine daily to prevent HIV infection. This is called preexposure prophylaxis (PrEP). You are considered at risk if:  You are sexually active and do not regularly use condoms or know the HIV status of your partner(s).  You take drugs by injection.  You are sexually active with a partner who has HIV.  Talk with your health care provider about whether you are  at high risk of being infected with HIV. If you choose to begin PrEP, you should first be tested for HIV. You should then be tested every 3 months for as long as you are taking PrEP.  Osteoporosis is a disease in which the bones lose minerals and strength with aging. This can result in serious bone fractures or breaks. The risk of osteoporosis can be identified using a bone density scan. Women ages 103 years and over and women at risk for fractures or osteoporosis should discuss screening with their health care providers. Ask your health care provider whether you should take a calcium supplement or vitamin D to reduce the rate of osteoporosis.  Menopause can be associated with physical symptoms and risks. Hormone replacement therapy is available to decrease symptoms and risks. You should talk to your health care provider about whether hormone replacement therapy is right for you.  Use sunscreen. Apply sunscreen liberally and repeatedly throughout the day. You should seek shade when your shadow is shorter than you. Protect yourself by wearing long sleeves, pants, a wide-brimmed hat, and sunglasses year round, whenever you are outdoors.  Once a month, do a whole body skin exam, using a mirror to look at the skin on your back. Tell your health  care provider of new moles, moles that have irregular borders, moles that are larger than a pencil eraser, or moles that have changed in shape or color.  Stay current with required vaccines (immunizations).  Influenza vaccine. All adults should be immunized every year.  Tetanus, diphtheria, and acellular pertussis (Td, Tdap) vaccine. Pregnant women should receive 1 dose of Tdap vaccine during each pregnancy. The dose should be obtained regardless of the length of time since the last dose. Immunization is preferred during the 27th-36th week of gestation. An adult who has not previously received Tdap or who does not know her vaccine status should receive 1 dose of Tdap. This initial dose should be followed by tetanus and diphtheria toxoids (Td) booster doses every 10 years. Adults with an unknown or incomplete history of completing a 3-dose immunization series with Td-containing vaccines should begin or complete a primary immunization series including a Tdap dose. Adults should receive a Td booster every 10 years.  Varicella vaccine. An adult without evidence of immunity to varicella should receive 2 doses or a second dose if she has previously received 1 dose. Pregnant females who do not have evidence of immunity should receive the first dose after pregnancy. This first dose should be obtained before leaving the health care facility. The second dose should be obtained 4-8 weeks after the first dose.  Human papillomavirus (HPV) vaccine. Females aged 13-26 years who have not received the vaccine previously should obtain the 3-dose series. The vaccine is not recommended for use in pregnant females. However, pregnancy testing is not needed before receiving a dose. If a female is found to be pregnant after receiving a dose, no treatment is needed. In that case, the remaining doses should be delayed until after the pregnancy. Immunization is recommended for any person with an immunocompromised condition through  the age of 81 years if she did not get any or all doses earlier. During the 3-dose series, the second dose should be obtained 4-8 weeks after the first dose. The third dose should be obtained 24 weeks after the first dose and 16 weeks after the second dose.  Zoster vaccine. One dose is recommended for adults aged 21 years or older unless certain conditions are present.  Measles, mumps, and rubella (MMR) vaccine. Adults born before [birth year] generally are considered immune to measles and mumps. Adults born in [birth year] or later should have 1 or more doses of MMR vaccine unless there is a contraindication to the vaccine or there is laboratory evidence of immunity to each of the three diseases. A routine second dose of MMR vaccine should be obtained at least 28 days after the first dose for students attending postsecondary schools, health care workers, or international travelers. People who received inactivated measles vaccine or an unknown type of measles vaccine during 1963-1967 should receive 2 doses of MMR vaccine. People who received inactivated mumps vaccine or an unknown type of mumps vaccine before 1979 and are at high risk for mumps infection should consider immunization with 2 doses of MMR vaccine. For females of childbearing age, rubella immunity should be determined. If there is no evidence of immunity, females who are not pregnant should be vaccinated. If there is no evidence of immunity, females who are pregnant should delay immunization until after pregnancy. Unvaccinated health care workers born before [birth year] who lack laboratory evidence of measles, mumps, or rubella immunity or laboratory confirmation of disease should consider measles and mumps immunization with 2 doses of MMR vaccine or rubella immunization with 1 dose of MMR vaccine.  Pneumococcal 13-valent conjugate (PCV13) vaccine. When indicated, a person who is uncertain of his immunization history and has no record of immunization should receive the  PCV13 vaccine. All adults 53 years of age and older should receive this vaccine. An adult aged 37 years or older who has certain medical conditions and has not been previously immunized should receive 1 dose of PCV13 vaccine. This PCV13 should be followed with a dose of pneumococcal polysaccharide (PPSV23) vaccine. Adults who are at high risk for pneumococcal disease should obtain the PPSV23 vaccine at least 8 weeks after the dose of PCV13 vaccine. Adults older than 56 years of age who have normal immune system function should obtain the PPSV23 vaccine dose at least 1 year after the dose of PCV13 vaccine.  Pneumococcal polysaccharide (PPSV23) vaccine. When PCV13 is also indicated, PCV13 should be obtained first. All adults aged 54 years and older should be immunized. An adult younger than age 65 years who has certain medical conditions should be immunized. Any person who resides in a nursing home or long-term care facility should be immunized. An adult smoker should be immunized. People with an immunocompromised condition and certain other conditions should receive both PCV13 and PPSV23 vaccines. People with human immunodeficiency virus (HIV) infection should be immunized as soon as possible after diagnosis. Immunization during chemotherapy or radiation therapy should be avoided. Routine use of PPSV23 vaccine is not recommended for American Indians, [place] Natives, or people younger than 65 years unless there are medical conditions that require PPSV23 vaccine. When indicated, people who have unknown immunization and have no record of immunization should receive PPSV23 vaccine. One-time revaccination 5 years after the first dose of PPSV23 is recommended for people aged 19-64 years who have chronic kidney failure, nephrotic syndrome, asplenia, or immunocompromised conditions. People who received 1-2 doses of PPSV23 before age 40 years should receive another dose of PPSV23 vaccine at age 64 years or later if at least  5 years have passed since the previous dose. Doses of PPSV23 are not needed for people immunized with PPSV23 at or after age 102 years.  Meningococcal vaccine. Adults with asplenia or persistent complement component deficiencies should receive 2 doses of quadrivalent meningococcal  conjugate (MenACWY-D) vaccine. The doses should be obtained at least 2 months apart. Microbiologists working with certain meningococcal bacteria, [employer] recruits, people at risk during an outbreak, and people who travel to or live in countries with a high rate of meningitis should be immunized. A first-year college student up through age 65 years who is living in a residence hall should receive a dose if she did not receive a dose on or after her 16th birthday. Adults who have certain high-risk conditions should receive one or more doses of vaccine.  Hepatitis A vaccine. Adults who wish to be protected from this disease, have certain high-risk conditions, work with hepatitis A-infected animals, work in hepatitis A research labs, or travel to or work in countries with a high rate of hepatitis A should be immunized. Adults who were previously unvaccinated and who anticipate close contact with an international adoptee during the first 60 days after arrival in the [place] from a country with a high rate of hepatitis A should be immunized.  Hepatitis B vaccine. Adults who wish to be protected from this disease, have certain high-risk conditions, may be exposed to blood or other infectious body fluids, are household contacts or sex partners of hepatitis B positive people, are clients or workers in certain care facilities, or travel to or work in countries with a high rate of hepatitis B should be immunized.  Haemophilus influenzae type b (Hib) vaccine. A previously unvaccinated person with asplenia or sickle cell disease or having a scheduled splenectomy should receive 1 dose of Hib vaccine. Regardless of previous immunization, a  recipient of a hematopoietic stem cell transplant should receive a 3-dose series 6-12 months after her successful transplant. Hib vaccine is not recommended for adults with HIV infection. Preventive Services / Frequency Ages 85 to 67 years  Blood pressure check.** / Every 3-5 years.  Lipid and cholesterol check.** / Every 5 years beginning at age 77.  Clinical breast exam.** / Every 3 years for women in their 38s and 44s.  BRCA-related cancer risk assessment.** / For women who have family members with a BRCA-related cancer (breast, ovarian, tubal, or peritoneal cancers).  Pap test.** / Every 2 years from ages 60 through 5. Every 3 years starting at age 37 through age 53 or 71 with a history of 3 consecutive normal Pap tests.  HPV screening.** / Every 3 years from ages 66 through ages 77 to 38 with a history of 3 consecutive normal Pap tests.  Hepatitis C blood test.** / For any individual with known risks for hepatitis C.  Skin self-exam. / Monthly.  Influenza vaccine. / Every year.  Tetanus, diphtheria, and acellular pertussis (Tdap, Td) vaccine.** / Consult your health care provider. Pregnant women should receive 1 dose of Tdap vaccine during each pregnancy. 1 dose of Td every 10 years.  Varicella vaccine.** / Consult your health care provider. Pregnant females who do not have evidence of immunity should receive the first dose after pregnancy.  HPV vaccine. / 3 doses over 6 months, if 75 and younger. The vaccine is not recommended for use in pregnant females. However, pregnancy testing is not needed before receiving a dose.  Measles, mumps, rubella (MMR) vaccine.** / You need at least 1 dose of MMR if you were born in 1957 or later. You may also need a 2nd dose. For females of childbearing age, rubella immunity should be determined. If there is no evidence of immunity, females who are not pregnant should be vaccinated. If there  is no evidence of immunity, females who are pregnant  should delay immunization until after pregnancy.  Pneumococcal 13-valent conjugate (PCV13) vaccine.** / Consult your health care provider.  Pneumococcal polysaccharide (PPSV23) vaccine.** / 1 to 2 doses if you smoke cigarettes or if you have certain conditions.  Meningococcal vaccine.** / 1 dose if you are age 42 to 34 years and a Market researcher living in a residence hall, or have one of several medical conditions, you need to get vaccinated against meningococcal disease. You may also need additional booster doses.  Hepatitis A vaccine.** / Consult your health care provider.  Hepatitis B vaccine.** / Consult your health care provider.  Haemophilus influenzae type b (Hib) vaccine.** / Consult your health care provider. Ages 59 to 89 years  Blood pressure check.** / Every year.  Lipid and cholesterol check.** / Every 5 years beginning at age 45 years.  Lung cancer screening. / Every year if you are aged 4-80 years and have a 30-pack-year history of smoking and currently smoke or have quit within the past 15 years. Yearly screening is stopped once you have quit smoking for at least 15 years or develop a health problem that would prevent you from having lung cancer treatment.  Clinical breast exam.** / Every year after age 25 years.  BRCA-related cancer risk assessment.** / For women who have family members with a BRCA-related cancer (breast, ovarian, tubal, or peritoneal cancers).  Mammogram.** / Every year beginning at age 58 years and continuing for as long as you are in good health. Consult with your health care provider.  Pap test.** / Every 3 years starting at age 51 years through age 33 or 56 years with a history of 3 consecutive normal Pap tests.  HPV screening.** / Every 3 years from ages 36 years through ages 34 to 70 years with a history of 3 consecutive normal Pap tests.  Fecal occult blood test (FOBT) of stool. / Every year beginning at age 64 years and  continuing until age 57 years. You may not need to do this test if you get a colonoscopy every 10 years.  Flexible sigmoidoscopy or colonoscopy.** / Every 5 years for a flexible sigmoidoscopy or every 10 years for a colonoscopy beginning at age 21 years and continuing until age 31 years.  Hepatitis C blood test.** / For all people born from [birth year] through 1965 and any individual with known risks for hepatitis C.  Skin self-exam. / Monthly.  Influenza vaccine. / Every year.  Tetanus, diphtheria, and acellular pertussis (Tdap/Td) vaccine.** / Consult your health care provider. Pregnant women should receive 1 dose of Tdap vaccine during each pregnancy. 1 dose of Td every 10 years.  Varicella vaccine.** / Consult your health care provider. Pregnant females who do not have evidence of immunity should receive the first dose after pregnancy.  Zoster vaccine.** / 1 dose for adults aged 40 years or older.  Measles, mumps, rubella (MMR) vaccine.** / You need at least 1 dose of MMR if you were born in 1957 or later. You may also need a second dose. For females of childbearing age, rubella immunity should be determined. If there is no evidence of immunity, females who are not pregnant should be vaccinated. If there is no evidence of immunity, females who are pregnant should delay immunization until after pregnancy.  Pneumococcal 13-valent conjugate (PCV13) vaccine.** / Consult your health care provider.  Pneumococcal polysaccharide (PPSV23) vaccine.** / 1 to 2 doses if you smoke cigarettes or if  you have certain conditions.  Meningococcal vaccine.** / Consult your health care provider.  Hepatitis A vaccine.** / Consult your health care provider.  Hepatitis B vaccine.** / Consult your health care provider.  Haemophilus influenzae type b (Hib) vaccine.** / Consult your health care provider. Ages 27 years and over  Blood pressure check.** / Every year.  Lipid and cholesterol check.** / Every 5 years  beginning at age 53 years.  Lung cancer screening. / Every year if you are aged 7-80 years and have a 30-pack-year history of smoking and currently smoke or have quit within the past 15 years. Yearly screening is stopped once you have quit smoking for at least 15 years or develop a health problem that would prevent you from having lung cancer treatment.  Clinical breast exam.** / Every year after age 97 years.  BRCA-related cancer risk assessment.** / For women who have family members with a BRCA-related cancer (breast, ovarian, tubal, or peritoneal cancers).  Mammogram.** / Every year beginning at age 47 years and continuing for as long as you are in good health. Consult with your health care provider.  Pap test.** / Every 3 years starting at age 81 years through age 34 or 84 years with 3 consecutive normal Pap tests. Testing can be stopped between 65 and 70 years with 3 consecutive normal Pap tests and no abnormal Pap or HPV tests in the past 10 years.  HPV screening.** / Every 3 years from ages 51 years through ages 50 or 58 years with a history of 3 consecutive normal Pap tests. Testing can be stopped between 65 and 70 years with 3 consecutive normal Pap tests and no abnormal Pap or HPV tests in the past 10 years.  Fecal occult blood test (FOBT) of stool. / Every year beginning at age 53 years and continuing until age 1 years. You may not need to do this test if you get a colonoscopy every 10 years.  Flexible sigmoidoscopy or colonoscopy.** / Every 5 years for a flexible sigmoidoscopy or every 10 years for a colonoscopy beginning at age 57 years and continuing until age 34 years.  Hepatitis C blood test.** / For all people born from [birth year] through 1966 and any individual with known risks for hepatitis C.  Osteoporosis screening.** / A one-time screening for women ages 2 years and over and women at risk for fractures or osteoporosis.  Skin self-exam. / Monthly.  Influenza vaccine. /  Every year.  Tetanus, diphtheria, and acellular pertussis (Tdap/Td) vaccine.** / 1 dose of Td every 10 years.  Varicella vaccine.** / Consult your health care provider.  Zoster vaccine.** / 1 dose for adults aged 1 years or older.  Pneumococcal 13-valent conjugate (PCV13) vaccine.** / Consult your health care provider.  Pneumococcal polysaccharide (PPSV23) vaccine.** / 1 dose for all adults aged 26 years and older.  Meningococcal vaccine.** / Consult your health care provider.  Hepatitis A vaccine.** / Consult your health care provider.  Hepatitis B vaccine.** / Consult your health care provider.  Haemophilus influenzae type b (Hib) vaccine.** / Consult your health care provider. ** Family history and personal history of risk and conditions may change your health care provider's recommendations.   This information is not intended to replace advice given to you by your health care provider. Make sure you discuss any questions you have with your health care provider.   Document Released: 02/21/2001 Document Revised: 01/16/2014 Document Reviewed: 05/23/2010 Elsevier Interactive Patient Education [employer].

## 2015-10-14 NOTE — Assessment & Plan Note (Signed)
Well controlled, no changes to meds. Encouraged heart healthy diet such as the DASH diet and exercise as tolerated.  °

## 2015-10-14 NOTE — Assessment & Plan Note (Signed)
Does note occasional edema may need to increase diuresis. Consider Compression hose

## 2015-10-14 NOTE — Assessment & Plan Note (Addendum)
Patient encouraged to maintain heart healthy diet, regular exercise, adequate sleep. Consider daily probiotics. Take medications as prescribed. Given and reviewed copy of ACP documents from [employer] of [place] and encouraged to complete and return. 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

## 2015-10-14 NOTE — Assessment & Plan Note (Signed)
Encouraged heart healthy diet, increase exercise, avoid trans fats, consider a krill oil cap daily. May consider Fenofibrate

## 2015-10-14 NOTE — Anesthesia Preprocedure Evaluation (Addendum)
Anesthesia Evaluation  Patient identified by MRN, date of birth, ID band Patient awake    Reviewed: Allergy & Precautions, NPO status , Patient's Chart, lab work & pertinent test results, reviewed documented beta blocker date and time   Airway Mallampati: II  TM Distance: >3 FB Neck ROM: Full    Dental  (+) Dental Advisory Given   Pulmonary asthma ,    breath sounds clear to auscultation       Cardiovascular hypertension, Pt. on medications and Pt. on home beta blockers  Rhythm:Regular Rate:Normal  08/2015 nuclear stress test: Normal low risk test with normal perfusion and normal EF.   Neuro/Psych negative neurological ROS     GI/Hepatic negative GI ROS, Neg liver ROS,   Endo/Other  negative endocrine ROS  Renal/GU negative Renal ROS     Musculoskeletal  (+) Arthritis ,   Abdominal   Peds  Hematology negative hematology ROS (+)   Anesthesia Other Findings   Reproductive/Obstetrics                           Lab Results  Component Value Date   WBC 9.6 10/14/2015   HGB 13.5 10/14/2015   HCT 39.8 10/14/2015   MCV 91.3 10/14/2015   PLT 417.0 (H) 10/14/2015   Lab Results  Component Value Date   CREATININE 0.68 10/14/2015   BUN 15 10/14/2015   NA 144 10/14/2015   K 3.9 10/14/2015   CL 104 10/14/2015   CO2 31 10/14/2015   Lab Results  Component Value Date   INR 1.03 10/08/2015    Anesthesia Physical Anesthesia Plan  ASA: II  Anesthesia Plan: MAC, Regional and Spinal   Post-op Pain Management:    Induction: Intravenous  Airway Management Planned: Simple Face Mask and Natural Airway  Additional Equipment:   Intra-op Plan:   Post-operative Plan:   Informed Consent: I have reviewed the patients History and Physical, chart, labs and discussed the procedure including the risks, benefits and alternatives for the proposed anesthesia with the patient or authorized representative  who has indicated his/her understanding and acceptance.   Dental advisory given  Plan Discussed with: CRNA  Anesthesia Plan Comments:        Anesthesia Quick Evaluation

## 2015-10-14 NOTE — Assessment & Plan Note (Signed)
Has surgery tomorrow with Dr Thomasena Edisollins of ortho for TKR

## 2015-10-14 NOTE — Assessment & Plan Note (Signed)
RRR today 

## 2015-10-14 NOTE — Progress Notes (Signed)
Pre visit review using our clinic review tool, if applicable. No additional management support is needed unless otherwise documented below in the visit note. 

## 2015-10-15 ENCOUNTER — Inpatient Hospital Stay (HOSPITAL_COMMUNITY)
Admission: RE | Admit: 2015-10-15 | Discharge: 2015-10-17 | DRG: 470 | Disposition: A | Discharge status: Home Health | Payer: 59 | Source: Ambulatory Visit | Attending: Specialist | Admitting: Specialist

## 2015-10-15 ENCOUNTER — Inpatient Hospital Stay (HOSPITAL_COMMUNITY): Payer: 59 | Admitting: Anesthesiology

## 2015-10-15 ENCOUNTER — Encounter (HOSPITAL_COMMUNITY)
Admission: RE | Disposition: A | Discharge status: Home Health | Payer: Self-pay | Source: Ambulatory Visit | Attending: Specialist

## 2015-10-15 ENCOUNTER — Encounter (HOSPITAL_COMMUNITY): Payer: Self-pay | Admitting: *Deleted

## 2015-10-15 DIAGNOSIS — Z96659 Presence of unspecified artificial knee joint: Secondary | ICD-10-CM

## 2015-10-15 DIAGNOSIS — M1712 Unilateral primary osteoarthritis, left knee: Principal | ICD-10-CM | POA: Diagnosis present

## 2015-10-15 DIAGNOSIS — J4530 Mild persistent asthma, uncomplicated: Secondary | ICD-10-CM | POA: Diagnosis present

## 2015-10-15 DIAGNOSIS — I10 Essential (primary) hypertension: Secondary | ICD-10-CM | POA: Diagnosis present

## 2015-10-15 DIAGNOSIS — E669 Obesity, unspecified: Secondary | ICD-10-CM | POA: Diagnosis present

## 2015-10-15 DIAGNOSIS — Z885 Allergy status to narcotic agent status: Secondary | ICD-10-CM

## 2015-10-15 DIAGNOSIS — E785 Hyperlipidemia, unspecified: Secondary | ICD-10-CM | POA: Diagnosis present

## 2015-10-15 DIAGNOSIS — Z6836 Body mass index (BMI) 36.0-36.9, adult: Secondary | ICD-10-CM | POA: Diagnosis not present

## 2015-10-15 DIAGNOSIS — M79609 Pain in unspecified limb: Secondary | ICD-10-CM | POA: Diagnosis not present

## 2015-10-15 DIAGNOSIS — M25562 Pain in left knee: Secondary | ICD-10-CM | POA: Diagnosis present

## 2015-10-15 HISTORY — PX: TOTAL KNEE ARTHROPLASTY: SHX125

## 2015-10-15 LAB — TYPE AND SCREEN
ABO/RH(D): A POS
Antibody Screen: NEGATIVE

## 2015-10-15 SURGERY — ARTHROPLASTY, KNEE, TOTAL
Anesthesia: Spinal | Site: Knee | Laterality: Left

## 2015-10-15 MED ORDER — OXYCODONE HCL 5 MG PO TABS: 5.0000 mg | ORAL_TABLET | ORAL | 0 refills | Status: DC | PRN

## 2015-10-15 MED ORDER — BUPIVACAINE LIPOSOME 1.3 % IJ SUSP
20.0000 mL | Freq: Once | INTRAMUSCULAR | Status: DC
  Filled 2015-10-15: qty 20

## 2015-10-15 MED ORDER — ACETAMINOPHEN 650 MG RE SUPP: 650.0000 mg | Freq: Four times a day (QID) | RECTAL | Status: DC | PRN

## 2015-10-15 MED ORDER — KETOROLAC TROMETHAMINE 30 MG/ML IJ SOLN
INTRAMUSCULAR | Status: AC
  Filled 2015-10-15: qty 1

## 2015-10-15 MED ORDER — DIPHENHYDRAMINE HCL 12.5 MG/5ML PO ELIX: 12.5000 mg | ORAL_SOLUTION | ORAL | Status: DC | PRN

## 2015-10-15 MED ORDER — FENTANYL CITRATE (PF) 100 MCG/2ML IJ SOLN
INTRAMUSCULAR | Status: DC | PRN
  Administered 2015-10-15: 100 ug via INTRAVENOUS

## 2015-10-15 MED ORDER — SODIUM CHLORIDE 0.9 % IR SOLN
Status: DC | PRN
  Administered 2015-10-15: 1000 mL

## 2015-10-15 MED ORDER — MONTELUKAST SODIUM 10 MG PO TABS
10.0000 mg | ORAL_TABLET | Freq: Every day | ORAL | Status: DC
  Administered 2015-10-15 – 2015-10-16 (×2): 10 mg via ORAL
  Filled 2015-10-15 (×2): qty 1

## 2015-10-15 MED ORDER — MENTHOL 3 MG MT LOZG: 1.0000 | LOZENGE | OROMUCOSAL | Status: DC | PRN

## 2015-10-15 MED ORDER — FENTANYL CITRATE (PF) 100 MCG/2ML IJ SOLN
INTRAMUSCULAR | Status: AC
  Filled 2015-10-15: qty 2

## 2015-10-15 MED ORDER — CEFAZOLIN SODIUM-DEXTROSE 2-4 GM/100ML-% IV SOLN
2.0000 g | Freq: Four times a day (QID) | INTRAVENOUS | Status: AC
  Administered 2015-10-15 (×2): 2 g via INTRAVENOUS
  Filled 2015-10-15 (×2): qty 100

## 2015-10-15 MED ORDER — HYDROMORPHONE HCL 1 MG/ML IJ SOLN
1.0000 mg | INTRAMUSCULAR | Status: DC | PRN
  Administered 2015-10-16 (×3): 1 mg via INTRAVENOUS
  Filled 2015-10-15 (×3): qty 1

## 2015-10-15 MED ORDER — POLYETHYLENE GLYCOL 3350 17 G PO PACK: 17.0000 g | PACK | Freq: Every day | ORAL | Status: DC | PRN

## 2015-10-15 MED ORDER — LACTATED RINGERS IV SOLN
INTRAVENOUS | Status: DC | PRN
  Administered 2015-10-15 (×2): via INTRAVENOUS

## 2015-10-15 MED ORDER — VALSARTAN-HYDROCHLOROTHIAZIDE 160-12.5 MG PO TABS: 1.0000 | ORAL_TABLET | Freq: Every day | ORAL | Status: DC

## 2015-10-15 MED ORDER — DEXAMETHASONE SODIUM PHOSPHATE 10 MG/ML IJ SOLN
10.0000 mg | Freq: Once | INTRAMUSCULAR | Status: AC
  Administered 2015-10-16: 10 mg via INTRAVENOUS
  Filled 2015-10-15: qty 1

## 2015-10-15 MED ORDER — ROPIVACAINE HCL 7.5 MG/ML IJ SOLN
INTRAMUSCULAR | Status: AC
  Filled 2015-10-15: qty 20

## 2015-10-15 MED ORDER — ASPIRIN EC 325 MG PO TBEC: 325.0000 mg | DELAYED_RELEASE_TABLET | Freq: Two times a day (BID) | ORAL | 0 refills | Status: DC

## 2015-10-15 MED ORDER — DEXAMETHASONE SODIUM PHOSPHATE 10 MG/ML IJ SOLN
10.0000 mg | Freq: Once | INTRAMUSCULAR | Status: AC
  Administered 2015-10-15: 10 mg via INTRAVENOUS

## 2015-10-15 MED ORDER — BUPIVACAINE HCL (PF) 0.25 % IJ SOLN
INTRAMUSCULAR | Status: DC | PRN
  Administered 2015-10-15: 30 mL

## 2015-10-15 MED ORDER — MIDAZOLAM HCL 5 MG/5ML IJ SOLN
INTRAMUSCULAR | Status: DC | PRN
  Administered 2015-10-15: 2 mg via INTRAVENOUS

## 2015-10-15 MED ORDER — ACETAMINOPHEN 325 MG PO TABS
650.0000 mg | ORAL_TABLET | Freq: Four times a day (QID) | ORAL | Status: DC | PRN
  Administered 2015-10-15: 650 mg via ORAL
  Filled 2015-10-15: qty 2

## 2015-10-15 MED ORDER — DEXTROSE 5 % IV SOLN
500.0000 mg | Freq: Four times a day (QID) | INTRAVENOUS | Status: DC | PRN
  Administered 2015-10-15: 500 mg via INTRAVENOUS
  Filled 2015-10-15: qty 5
  Filled 2015-10-15: qty 550

## 2015-10-15 MED ORDER — METOCLOPRAMIDE HCL 5 MG/ML IJ SOLN
5.0000 mg | Freq: Three times a day (TID) | INTRAMUSCULAR | Status: DC | PRN
  Administered 2015-10-16: 10 mg via INTRAVENOUS
  Filled 2015-10-15: qty 2

## 2015-10-15 MED ORDER — METOPROLOL SUCCINATE ER 25 MG PO TB24
25.0000 mg | ORAL_TABLET | Freq: Every day | ORAL | Status: DC
  Administered 2015-10-16 – 2015-10-17 (×2): 25 mg via ORAL
  Filled 2015-10-15 (×2): qty 1

## 2015-10-15 MED ORDER — MIDAZOLAM HCL 2 MG/2ML IJ SOLN
INTRAMUSCULAR | Status: AC
  Filled 2015-10-15: qty 2

## 2015-10-15 MED ORDER — ROPIVACAINE HCL 7.5 MG/ML IJ SOLN
INTRAMUSCULAR | Status: DC | PRN
  Administered 2015-10-15: 15 mL via PERINEURAL

## 2015-10-15 MED ORDER — ENOXAPARIN SODIUM 30 MG/0.3ML ~~LOC~~ SOLN
30.0000 mg | Freq: Two times a day (BID) | SUBCUTANEOUS | Status: DC
  Administered 2015-10-16 – 2015-10-17 (×3): 30 mg via SUBCUTANEOUS
  Filled 2015-10-15 (×3): qty 0.3

## 2015-10-15 MED ORDER — CEFAZOLIN SODIUM-DEXTROSE 2-4 GM/100ML-% IV SOLN
INTRAVENOUS | Status: AC
  Filled 2015-10-15: qty 100

## 2015-10-15 MED ORDER — ONDANSETRON HCL 4 MG/2ML IJ SOLN
4.0000 mg | Freq: Four times a day (QID) | INTRAMUSCULAR | Status: DC | PRN
  Filled 2015-10-15: qty 2

## 2015-10-15 MED ORDER — BUPIVACAINE HCL (PF) 0.25 % IJ SOLN
INTRAMUSCULAR | Status: AC
  Filled 2015-10-15: qty 30

## 2015-10-15 MED ORDER — ALBUTEROL SULFATE (2.5 MG/3ML) 0.083% IN NEBU: 2.5000 mg | INHALATION_SOLUTION | Freq: Four times a day (QID) | RESPIRATORY_TRACT | Status: DC | PRN

## 2015-10-15 MED ORDER — PROPOFOL 10 MG/ML IV BOLUS
INTRAVENOUS | Status: AC
  Filled 2015-10-15: qty 40

## 2015-10-15 MED ORDER — METOCLOPRAMIDE HCL 5 MG PO TABS: 5.0000 mg | ORAL_TABLET | Freq: Three times a day (TID) | ORAL | Status: DC | PRN

## 2015-10-15 MED ORDER — FERROUS SULFATE 325 (65 FE) MG PO TABS
325.0000 mg | ORAL_TABLET | Freq: Three times a day (TID) | ORAL | Status: DC
Start: 2015-10-15 — End: 2015-10-17
  Administered 2015-10-15 – 2015-10-17 (×6): 325 mg via ORAL
  Filled 2015-10-15 (×6): qty 1

## 2015-10-15 MED ORDER — HYDROCHLOROTHIAZIDE 12.5 MG PO CAPS
12.5000 mg | ORAL_CAPSULE | Freq: Every day | ORAL | Status: DC
  Administered 2015-10-16 – 2015-10-17 (×2): 12.5 mg via ORAL
  Filled 2015-10-15 (×2): qty 1

## 2015-10-15 MED ORDER — FENOFIBRATE 160 MG PO TABS
160.0000 mg | ORAL_TABLET | Freq: Every day | ORAL | Status: DC
  Administered 2015-10-16 – 2015-10-17 (×2): 160 mg via ORAL
  Filled 2015-10-15 (×2): qty 1

## 2015-10-15 MED ORDER — BUPIVACAINE HCL (PF) 0.75 % IJ SOLN
INTRAMUSCULAR | Status: DC | PRN
  Administered 2015-10-15: 2 mL via INTRATHECAL

## 2015-10-15 MED ORDER — IRBESARTAN 150 MG PO TABS
150.0000 mg | ORAL_TABLET | Freq: Every day | ORAL | Status: DC
  Administered 2015-10-16 – 2015-10-17 (×2): 150 mg via ORAL
  Filled 2015-10-15 (×2): qty 1

## 2015-10-15 MED ORDER — SODIUM CHLORIDE 0.9 % IJ SOLN
INTRAMUSCULAR | Status: AC
  Filled 2015-10-15: qty 50

## 2015-10-15 MED ORDER — PHENOL 1.4 % MT LIQD: 1.0000 | OROMUCOSAL | Status: DC | PRN

## 2015-10-15 MED ORDER — SODIUM CHLORIDE 0.9 % IJ SOLN
INTRAMUSCULAR | Status: DC | PRN
  Administered 2015-10-15: 30 mL

## 2015-10-15 MED ORDER — BUPIVACAINE-EPINEPHRINE 0.25% -1:200000 IJ SOLN: INTRAMUSCULAR | Status: DC | PRN

## 2015-10-15 MED ORDER — PROPOFOL 500 MG/50ML IV EMUL
INTRAVENOUS | Status: DC | PRN
  Administered 2015-10-15: 50 ug/kg/min via INTRAVENOUS

## 2015-10-15 MED ORDER — DOCUSATE SODIUM 100 MG PO CAPS
100.0000 mg | ORAL_CAPSULE | Freq: Two times a day (BID) | ORAL | Status: DC
  Administered 2015-10-15 – 2015-10-17 (×4): 100 mg via ORAL
  Filled 2015-10-15 (×4): qty 1

## 2015-10-15 MED ORDER — ALUM & MAG HYDROXIDE-SIMETH 200-200-20 MG/5ML PO SUSP: 30.0000 mL | ORAL | Status: DC | PRN

## 2015-10-15 MED ORDER — OXYCODONE HCL 5 MG PO TABS
5.0000 mg | ORAL_TABLET | ORAL | Status: DC | PRN
  Administered 2015-10-15 (×2): 10 mg via ORAL
  Administered 2015-10-15 (×2): 5 mg via ORAL
  Administered 2015-10-16 (×4): 10 mg via ORAL
  Filled 2015-10-15 (×6): qty 2
  Filled 2015-10-15: qty 1
  Filled 2015-10-15: qty 2
  Filled 2015-10-15: qty 1
  Filled 2015-10-15: qty 2

## 2015-10-15 MED ORDER — ZOLPIDEM TARTRATE 5 MG PO TABS: 5.0000 mg | ORAL_TABLET | Freq: Every evening | ORAL | Status: DC | PRN

## 2015-10-15 MED ORDER — PROPOFOL 10 MG/ML IV BOLUS
INTRAVENOUS | Status: AC
  Filled 2015-10-15: qty 20

## 2015-10-15 MED ORDER — METHOCARBAMOL 500 MG PO TABS: 500.0000 mg | ORAL_TABLET | Freq: Three times a day (TID) | ORAL | 2 refills | Status: DC | PRN

## 2015-10-15 MED ORDER — BISACODYL 5 MG PO TBEC: 5.0000 mg | DELAYED_RELEASE_TABLET | Freq: Every day | ORAL | Status: DC | PRN

## 2015-10-15 MED ORDER — BUDESONIDE 0.5 MG/2ML IN SUSP
0.5000 mg | Freq: Two times a day (BID) | RESPIRATORY_TRACT | Status: DC
  Administered 2015-10-16: 0.5 mg via RESPIRATORY_TRACT
  Filled 2015-10-15 (×2): qty 2

## 2015-10-15 MED ORDER — CEFAZOLIN SODIUM-DEXTROSE 2-3 GM-% IV SOLR
INTRAVENOUS | Status: DC | PRN
  Administered 2015-10-15: 2 g via INTRAVENOUS

## 2015-10-15 MED ORDER — KETOROLAC TROMETHAMINE 30 MG/ML IJ SOLN
INTRAMUSCULAR | Status: DC | PRN
  Administered 2015-10-15: 30 mg

## 2015-10-15 MED ORDER — SODIUM CHLORIDE 0.9 % IV SOLN
INTRAVENOUS | Status: DC
  Administered 2015-10-15 – 2015-10-16 (×2): via INTRAVENOUS

## 2015-10-15 MED ORDER — METHOCARBAMOL 500 MG PO TABS
500.0000 mg | ORAL_TABLET | Freq: Four times a day (QID) | ORAL | Status: DC | PRN
  Administered 2015-10-15 – 2015-10-17 (×6): 500 mg via ORAL
  Filled 2015-10-15 (×7): qty 1

## 2015-10-15 MED ORDER — TRANEXAMIC ACID 1000 MG/10ML IV SOLN
1000.0000 mg | INTRAVENOUS | Status: AC
  Administered 2015-10-15: 1000 mg via INTRAVENOUS
  Filled 2015-10-15: qty 1100

## 2015-10-15 MED ORDER — MAGNESIUM CITRATE PO SOLN: 1.0000 | Freq: Once | ORAL | Status: DC | PRN

## 2015-10-15 MED ORDER — ALBUTEROL SULFATE HFA 108 (90 BASE) MCG/ACT IN AERS: 2.0000 | INHALATION_SPRAY | Freq: Four times a day (QID) | RESPIRATORY_TRACT | Status: DC | PRN

## 2015-10-15 MED ORDER — ONDANSETRON HCL 4 MG PO TABS
4.0000 mg | ORAL_TABLET | Freq: Four times a day (QID) | ORAL | Status: DC | PRN
  Administered 2015-10-15 – 2015-10-16 (×2): 4 mg via ORAL
  Filled 2015-10-15 (×2): qty 1

## 2015-10-15 SURGICAL SUPPLY — 60 items
BAG DECANTER FOR FLEXI CONT (MISCELLANEOUS) IMPLANT
BAG ZIPLOCK 12X15 (MISCELLANEOUS) ×4 IMPLANT
BANDAGE ACE 4X5 VEL STRL LF (GAUZE/BANDAGES/DRESSINGS) ×2 IMPLANT
BANDAGE ACE 6X5 VEL STRL LF (GAUZE/BANDAGES/DRESSINGS) ×2 IMPLANT
BLADE SAG 18X100X1.27 (BLADE) ×2 IMPLANT
BLADE SAW SGTL 13.0X1.19X90.0M (BLADE) ×2 IMPLANT
CAP KNEE TOTAL 3 SIGMA ×2 IMPLANT
CEMENT HV SMART SET (Cement) ×4 IMPLANT
CLOTH BEACON ORANGE TIMEOUT ST (SAFETY) ×2 IMPLANT
CUFF TOURN SGL QUICK 34 (TOURNIQUET CUFF) ×1
CUFF TRNQT CYL 34X4X40X1 (TOURNIQUET CUFF) ×1 IMPLANT
DECANTER SPIKE VIAL GLASS SM (MISCELLANEOUS) ×2 IMPLANT
DERMABOND ADVANCED (GAUZE/BANDAGES/DRESSINGS) ×1
DERMABOND ADVANCED .7 DNX12 (GAUZE/BANDAGES/DRESSINGS) ×1 IMPLANT
DRAPE U-SHAPE 47X51 STRL (DRAPES) ×2 IMPLANT
DRESSING AQUACEL AG SP 3.5X10 (GAUZE/BANDAGES/DRESSINGS) ×1 IMPLANT
DRSG AQUACEL AG ADV 3.5X10 (GAUZE/BANDAGES/DRESSINGS) ×2 IMPLANT
DRSG AQUACEL AG SP 3.5X10 (GAUZE/BANDAGES/DRESSINGS) ×2
DRSG TEGADERM 4X4.75 (GAUZE/BANDAGES/DRESSINGS) ×2 IMPLANT
DURAPREP 26ML APPLICATOR (WOUND CARE) ×4 IMPLANT
ELECT REM PT RETURN 9FT ADLT (ELECTROSURGICAL) ×2
ELECTRODE REM PT RTRN 9FT ADLT (ELECTROSURGICAL) ×1 IMPLANT
EVACUATOR 1/8 PVC DRAIN (DRAIN) ×2 IMPLANT
GAUZE SPONGE 2X2 8PLY STRL LF (GAUZE/BANDAGES/DRESSINGS) ×1 IMPLANT
GLOVE BIOGEL PI IND STRL 8 (GLOVE) ×2 IMPLANT
GLOVE BIOGEL PI INDICATOR 8 (GLOVE) ×2
GLOVE ECLIPSE 8.0 STRL XLNG CF (GLOVE) ×4 IMPLANT
GLOVE SURG ORTHO 9.0 STRL STRW (GLOVE) ×2 IMPLANT
GLOVE SURG SS PI 7.5 STRL IVOR (GLOVE) ×2 IMPLANT
GOWN STRL REUS W/TWL XL LVL3 (GOWN DISPOSABLE) ×4 IMPLANT
HANDPIECE INTERPULSE COAX TIP (DISPOSABLE) ×1
IMMOBILIZER KNEE 20 (SOFTGOODS) ×2
IMMOBILIZER KNEE 20 THIGH 36 (SOFTGOODS) ×1 IMPLANT
LIQUID BAND (GAUZE/BANDAGES/DRESSINGS) ×2 IMPLANT
NS IRRIG 1000ML POUR BTL (IV SOLUTION) ×2 IMPLANT
PACK TOTAL KNEE CUSTOM (KITS) ×2 IMPLANT
POSITIONER SURGICAL ARM (MISCELLANEOUS) ×2 IMPLANT
SET HNDPC FAN SPRY TIP SCT (DISPOSABLE) ×1 IMPLANT
SET PAD KNEE POSITIONER (MISCELLANEOUS) ×2 IMPLANT
SPONGE GAUZE 2X2 STER 10/PKG (GAUZE/BANDAGES/DRESSINGS) ×1
SPONGE LAP 18X18 X RAY DECT (DISPOSABLE) IMPLANT
SPONGE SURGIFOAM ABS GEL 100 (HEMOSTASIS) ×2 IMPLANT
STOCKINETTE 6  STRL (DRAPES) ×1
STOCKINETTE 6 STRL (DRAPES) ×1 IMPLANT
SUCTION FRAZIER HANDLE 12FR (TUBING) ×1
SUCTION TUBE FRAZIER 12FR DISP (TUBING) ×1 IMPLANT
SUT BONE WAX W31G (SUTURE) IMPLANT
SUT MNCRL AB 3-0 PS2 18 (SUTURE) ×2 IMPLANT
SUT VIC AB 1 CT1 27 (SUTURE) ×4
SUT VIC AB 1 CT1 27XBRD ANTBC (SUTURE) ×4 IMPLANT
SUT VIC AB 2-0 CT1 27 (SUTURE) ×2
SUT VIC AB 2-0 CT1 TAPERPNT 27 (SUTURE) ×2 IMPLANT
SUT VLOC 180 0 24IN GS25 (SUTURE) ×4 IMPLANT
SYR 50ML LL SCALE MARK (SYRINGE) ×2 IMPLANT
TAPE STRIPS DRAPE STRL (GAUZE/BANDAGES/DRESSINGS) ×2 IMPLANT
TOWER CARTRIDGE SMART MIX (DISPOSABLE) ×2 IMPLANT
TRAY FOLEY W/METER SILVER 16FR (SET/KITS/TRAYS/PACK) ×2 IMPLANT
WATER STERILE IRR 1500ML POUR (IV SOLUTION) ×4 IMPLANT
WRAP KNEE MAXI GEL POST OP (GAUZE/BANDAGES/DRESSINGS) ×2 IMPLANT
YANKAUER SUCT BULB TIP 10FT TU (MISCELLANEOUS) ×2 IMPLANT

## 2015-10-15 NOTE — Progress Notes (Signed)
PT Cancellation Note  Patient Details Name: Bary LericheKimberly K Coin MRN: [id number] DOB: 04/22/1960   Cancelled Treatment:    Reason Eval/Treat Not Completed: Other (comment) (pt remains numb (s/p spinal block))   Zayan Delvecchio,KATHrine E 10/15/2015, 2:59 PM Zenovia JarredKati Thurmon Mizell, PT, DPT 10/15/2015 Pager: [phone]

## 2015-10-15 NOTE — Care Management Note (Signed)
Case Management Note  Patient Details  Name: Bary LericheKimberly K Scrivener MRN: [id number] Date of Birth: 11/09/1960  Subjective/Objective: 55 y/o f admitted w/OA L knee. S/p L TKA. From home. [employer] rep Tim already following from office. Await PT recc.                   Action/Plan:d/c plan home.   Expected Discharge Date:                  Expected Discharge Plan:  Home w Home Health Services  In-House Referral:     Discharge planning Services  CM Consult  Post Acute Care Choice:    Choice offered to:     DME Arranged:    DME Agency:     HH Arranged:    HH Agency:     Status of Service:  In process, will continue to follow  If discussed at Long Length of Stay Meetings, dates discussed:    Additional Comments:  Lanier ClamMahabir, Lovel Suazo, RN 10/15/2015, 12:29 PM

## 2015-10-15 NOTE — Anesthesia Procedure Notes (Signed)
Spinal  Patient location during procedure: OR Start time: 10/15/2015 7:50 AM End time: 10/15/2015 7:53 AM Staffing Resident/CRNA: Kizzie FantasiaARVER, Lindalou Soltis J Performed: resident/CRNA  Preanesthetic Checklist Completed: patient identified, site marked, surgical consent, pre-op evaluation, timeout performed, IV checked, risks and benefits discussed and monitors and equipment checked Spinal Block Patient position: sitting Prep: Betasept Patient monitoring: heart rate, continuous pulse ox and blood pressure Approach: midline Location: L4-5 Needle Needle type: Sprotte  Needle gauge: 24 G Needle length: 9 cm Needle insertion depth: 8 cm Additional Notes Pt sitting position, sterile prep and drape, negative paresthesia, negative heme.

## 2015-10-15 NOTE — Anesthesia Postprocedure Evaluation (Signed)
Anesthesia Post Note  Patient: Kathleen Church  Procedure(s) Performed: Procedure(s) (LRB): LEFT TOTAL KNEE ARTHROPLASTY (Left)  Patient location during evaluation: PACU Anesthesia Type: Spinal, MAC and Regional Level of consciousness: awake and alert Pain management: pain level controlled Vital Signs Assessment: post-procedure vital signs reviewed and stable Respiratory status: spontaneous breathing and respiratory function stable Cardiovascular status: blood pressure returned to baseline and stable Postop Assessment: spinal receding Anesthetic complications: no    Last Vitals:  Vitals:   10/15/15 1052 10/15/15 1104  BP: (!) 146/83 138/65  Pulse: 71 70  Resp: 17 16  Temp:  36.4 C    Last Pain:  Vitals:   10/15/15 0540  TempSrc: Oral                 Kennieth RadFitzgerald, Norrin Shreffler E

## 2015-10-15 NOTE — Anesthesia Procedure Notes (Addendum)
Anesthesia Regional Block:  Adductor canal block  Pre-Anesthetic Checklist: ,, timeout performed, Correct Patient, Correct Site, Correct Laterality, Correct Procedure, Correct Position, site marked, Risks and benefits discussed,  Surgical consent,  Pre-op evaluation,  At surgeon's request and post-op pain management  Laterality: Left  Prep: chloraprep       Needles:  Injection technique: Single-shot  Needle Type: Echogenic Needle     Needle Length: 9cm 9 cm Needle Gauge: 21 and 21 G    Additional Needles:  Procedures: ultrasound guided (picture in chart) Adductor canal block Narrative:  Start time: 10/15/2015 7:20 AM End time: 10/15/2015 7:26 AM Injection made incrementally with aspirations every 5 mL.  Performed by: Personally  Anesthesiologist: Marcene DuosFITZGERALD, Abrahim Sargent

## 2015-10-15 NOTE — Transfer of Care (Signed)
Immediate Anesthesia Transfer of Care Note  Patient: Kathleen Church  Procedure(s) Performed: Procedure(s): LEFT TOTAL KNEE ARTHROPLASTY (Left)  Patient Location: PACU  Anesthesia Type:Spinal  Level of Consciousness: awake, alert  and oriented  Airway & Oxygen Therapy: Patient Spontanous Breathing and Patient connected to face mask oxygen  Post-op Assessment: Report given to RN and Post -op Vital signs reviewed and stable  Post vital signs: Reviewed and stable  Last Vitals:  Vitals:   10/15/15 0535 10/15/15 0540  BP: (!) 152/89 (!) 152/89  Pulse: 99 99  Resp: 18 18  Temp: 36.8 C 36.8 C    Last Pain:  Vitals:   10/15/15 0540  TempSrc: Oral      Patients Stated Pain Goal: 4 (10/15/15 [id number])  Complications: No apparent anesthesia complications

## 2015-10-15 NOTE — Interval H&P Note (Signed)
History and Physical Interval Note:  10/15/2015 7:39 AM  Bary LericheKimberly K Wilkie  has presented today for surgery, with the diagnosis of LEFT KNEE OA  The various methods of treatment have been discussed with the patient and family. After consideration of risks, benefits and other options for treatment, the patient has consented to  Procedure(s): LEFT TOTAL KNEE ARTHROPLASTY (Left) as a surgical intervention .  The patient's history has been reviewed, patient examined, no change in status, stable for surgery.  I have reviewed the patient's chart and labs.  Questions were answered to the patient's satisfaction.     Myking Sar ANDREW

## 2015-10-15 NOTE — Op Note (Signed)
DATE OF SURGERY:  10/15/2015  TIME: 9:52 AM  PATIENT NAME:  Kathleen Church    AGE: 55 y.o.   PRE-OPERATIVE DIAGNOSIS:  LEFT KNEE OA  POST-OPERATIVE DIAGNOSIS:  LEFT KNEE OA  PROCEDURE:  Procedure(s): LEFT TOTAL KNEE ARTHROPLASTY  SURGEON:  Veasna Santibanez ANDREW  ASSISTANT:  Bryson Stilwell, PA-C, present and scrubbed throughout the case, critical for assistance with exposure, retraction, instrumentation, and closure.  OPERATIVE IMPLANTS: Depuy PFC Sigma Rotating Platform.  Femur size 3, Tibia size 3, Patella size 35 3-peg oval button, with a 10 mm polyethylene insert.   PREOPERATIVE INDICATIONS:   Kathleen Church is a 55 y.o. 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   The risks, benefits, and alternatives were discussed at length including but not limited to the risks of infection, bleeding, nerve injury, stiffness, blood clots, the need for revision surgery, cardiopulmonary complications, among others, and they were willing to proceed.  OPERATIVE DESCRIPTION:  The patient was brought to the operative room and placed in a supine position.  Spinal anesthesia was administered.  IV antibiotics were given.  The lower extremity was prepped and draped in the usual sterile fashion.  Time out was performed.  The leg was elevated and exsanguinated and the tourniquet was inflated.  Anterior quadriceps tendon splitting approach was performed.  The patella was retracted and osteophytes were removed.  The anterior horn of the medial and lateral meniscus was removed and cruciate ligaments resected.   The distal femur was opened with the drill and the intramedullary distal femoral cutting jig was utilized, set at 5 degrees resecting 10 mm off the distal femur.  Care was taken to protect the collateral ligaments.  The distal femoral sizing jig was applied, taking care to avoid notching.  Then  the 4-in-1 cutting jig was applied and the anterior and posterior femur was cut, along with the chamfer cuts.    Then the extramedullary tibial cutting jig was utilized making the appropriate cut using the anterior tibial crest as a reference building in appropriate posterior slope.  Care was taken during the cut to protect the medial and collateral ligaments.  The proximal tibia was removed along with the posterior horns of the menisci.   The posterior medial femoral osteophytes and posterior lateral femoral osteophytes were removed.    The flexion gap was then measured and was symmetric with the extension gap, measured at 10.  I completed the distal femoral preparation using the appropriate jig to prepare the box.  The patella was then measured, and cut with the saw.    The proximal tibia sized and prepared accordingly with the reamer and the punch, and then all components were trialed with the trial insert.  The knee was found to have excellent balance and full motion.    The above named components were then cemented into place and all excess cement was removed.  The trial polyethylene component was in place during cementation, and then was exchanged for the real polyethylene component.    The knee was easily taken through a range of motion and the patella tracked well and the knee irrigated copiously and the parapatellar and subcutaneous tissue closed with vicryl, and monocryl with steri strips for the skin.  The arthrotomy was closed at 90 of flexion. The wounds were dressed with sterile gauze and the tourniquet released and the patient was awakened and returned to the PACU  in stable and satisfactory condition.  There were no complications.  Total tourniquet time was 90 minutes.

## 2015-10-16 LAB — CBC
HCT: 33.7 % — ABNORMAL LOW (ref 36.0–46.0)
Hemoglobin: 11.1 g/dL — ABNORMAL LOW (ref 12.0–15.0)
MCH: 31.2 pg (ref 26.0–34.0)
MCHC: 32.9 g/dL (ref 30.0–36.0)
MCV: 94.7 fL (ref 78.0–100.0)
PLATELETS: 384 10*3/uL (ref 150–400)
RBC: 3.56 MIL/uL — ABNORMAL LOW (ref 3.87–5.11)
RDW: 12.8 % (ref 11.5–15.5)
WBC: 14.7 10*3/uL — AB (ref 4.0–10.5)

## 2015-10-16 LAB — BASIC METABOLIC PANEL
ANION GAP: 7 (ref 5–15)
BUN: 14 mg/dL (ref 6–20)
CALCIUM: 8.7 mg/dL — AB (ref 8.9–10.3)
CO2: 26 mmol/L (ref 22–32)
CREATININE: 0.65 mg/dL (ref 0.44–1.00)
Chloride: 108 mmol/L (ref 101–111)
GFR calc Af Amer: >60 mL/min (ref >60)
GLUCOSE: 121 mg/dL — AB (ref 65–99)
Potassium: 3.5 mmol/L (ref 3.5–5.1)
Sodium: 141 mmol/L (ref 135–145)

## 2015-10-16 MED ORDER — OXYCODONE-ACETAMINOPHEN 5-325 MG PO TABS
1.0000 | ORAL_TABLET | ORAL | Status: DC | PRN
  Administered 2015-10-16: 1 via ORAL
  Administered 2015-10-17 (×3): 2 via ORAL
  Filled 2015-10-16 (×3): qty 2
  Filled 2015-10-16: qty 1

## 2015-10-16 NOTE — Evaluation (Signed)
Occupational Therapy Evaluation Patient Details Name: Kathleen Church MRN: [id number] DOB: 08/09/1960 Today's Date: 10/16/2015    History of Present Illness s/p L TKA   Clinical Impression   This 55 year old female was admitted for the above sx. All education was completed. No further OT is needed at this time    Follow Up Recommendations  No OT follow up    Equipment Recommendations  None recommended by OT    Recommendations for Other Services       Precautions / Restrictions Precautions Precautions: Fall;Knee Required Braces or Orthoses: Knee Immobilizer - Left Restrictions Weight Bearing Restrictions: No Other Position/Activity Restrictions: WBAT      Mobility Bed Mobility              Transfers Overall transfer level: Needs assistance Equipment used: Rolling walker (2 wheeled) Transfers: Sit to/from Stand;Stand Pivot Transfers Sit to Stand: Min guard Stand pivot transfers: Min assist       General transfer comment: for safety    Balance                                            ADL Overall ADL's : Needs assistance/impaired     Grooming: Oral care;Supervision/safety;Standing   Upper Body Bathing: Set up;Sitting   Lower Body Bathing: Minimal assistance;Sit to/from stand   Upper Body Dressing : Set up;Sitting   Lower Body Dressing: Moderate assistance;Sit to/from stand   Toilet Transfer: Min guard;BSC;RW;Ambulation   Toileting- ArchitectClothing Manipulation and Hygiene: Min guard;Sit to/from stand         General ADL Comments: performed ADL and ambulated to bathroom for toileting and to brush teeth.  Pt has a tub bench at home and toilet riser. She has assistance for adls and a Sports administratorreacher; educated on Sports administratorreacher uses for Marriott[employer]ls     Vision     Perception     Praxis      Pertinent Vitals/Pain Pain Assessment: 0-10 Pain Score: 5  Pain Location: L knee Pain Descriptors / Indicators: Sore Pain Intervention(s): Limited  activity within patient's tolerance;Monitored during session;Premedicated before session;Repositioned;Ice applied     Hand Dominance     Extremity/Trunk Assessment Upper Extremity Assessment Upper Extremity Assessment: Overall WFL for tasks assessed          Communication Communication Communication: No difficulties   Cognition Arousal/Alertness: Awake/alert Behavior During Therapy: WFL for tasks assessed/performed Overall Cognitive Status: Within Functional Limits for tasks assessed                     General Comments       Exercises       Shoulder Instructions      Home Living Family/patient expects to be discharged to:: Private residence Living Arrangements: Children Available Help at Discharge: Family Type of Home: House Home Access: Stairs to enter Secretary/administratorntrance Stairs-Number of Steps: 2 Entrance Stairs-Rails: None       Bathroom Shower/Tub: Tub/shower unit Shower/tub characteristics: Curtain FirefighterBathroom Toilet: Handicapped height     Home Equipment: Tub bench;Toilet riser;Walker - 2 wheels          Prior Functioning/Environment Level of Independence: Independent                 OT Problem List:     OT Treatment/Interventions:      OT Goals(Current goals can be found in the care  plan section) Acute Rehab OT Goals Patient Stated Goal: home tomorrow OT Goal Formulation: All assessment and education complete, DC therapy  OT Frequency:     Barriers to D/C:            Co-evaluation              End of Session    Activity Tolerance: Patient tolerated treatment well Patient left: in chair;with call bell/phone within reach;with family/visitor present   Time: 0981-1914 OT Time Calculation (min): 30 min Charges:  OT General Charges $OT Visit: 1 Procedure OT Evaluation $OT Eval Low Complexity: 1 Procedure OT Treatments $Self Care/Home Management : 8-22 mins G-Codes:    Annamaria Salah 2015-10-18, 2:10 PM  Marica Otter, OTR/L [phone] 10-18-2015

## 2015-10-16 NOTE — Progress Notes (Signed)
Subjective: 1 Day Post-Op Procedure(s) (LRB): LEFT TOTAL KNEE ARTHROPLASTY (Left) Patient reports pain as well controlled.  Pain in left knee. Tolerating PO's. Denies SOB, CP, or calf pain. Reports a good night.   Objective: Vital signs in last 24 hours: Temp:  [97.5 F (36.4 C)-99.1 F (37.3 C)] 99.1 F (37.3 C) (10/07 0624) Pulse Rate:  [70-96] 96 (10/07 0624) Resp:  [15-23] 16 (10/07 0624) BP: (119-146)/(65-87) 136/87 (10/07 0624) SpO2:  [93 %-100 %] 98 % (10/07 0624) Weight:  [109.3 kg (241 lb)] 109.3 kg (241 lb) (10/06 1110)  Intake/Output from previous day: 10/06 0701 - 10/07 0700 In: 4098.6 [P.O.:1380; I.V.:2618.6; IV Piggyback:100] Out: 2630 [Urine:2600; Drains:5; Blood:25] Intake/Output this shift: No intake/output data recorded.   Recent Labs  10/14/15 0949 10/16/15 0439  HGB 13.5 11.1*    Recent Labs  10/14/15 0949 10/16/15 0439  WBC 9.6 14.7*  RBC 4.36 3.56*  HCT 39.8 33.7*  PLT 417.0* 384    Recent Labs  10/14/15 0949 10/16/15 0439  NA 144 141  K 3.9 3.5  CL 104 108  CO2 31 26  BUN 15 14  CREATININE 0.68 0.65  GLUCOSE 91 121*  CALCIUM 9.5 8.7*   No results for input(s): LABPT, INR in the last 72 hours.  Alert and oriented x3. RRR, Lungs clear, BS x4. Left Calf soft and non tender. L knee dressing C/D/I. No DVT signs. No signs of infection or compartment syndrome. LLE grossly neurovascularly intact.  Drain d/c with tip intact.  Assessment/Plan: 1 Day Post-Op Procedure(s) (LRB): LEFT TOTAL KNEE ARTHROPLASTY (Left) Up with PT Advance diet Plan D/c home tomorrow Continue current care Lovenox to ASA  Rx done  Amarii Bordas L 10/16/2015, 8:06 AM

## 2015-10-16 NOTE — Assessment & Plan Note (Signed)
Encouraged DASH diet, decrease po intake and increase exercise as tolerated. Needs 7-8 hours of sleep nightly. Avoid trans fats, eat small, frequent meals every 4-5 hours with lean proteins, complex carbs and healthy fats. Minimize simple carbs 

## 2015-10-16 NOTE — Assessment & Plan Note (Signed)
Infrequent. Increase hydration, consider magnesium and try Hyland's leg cramp med.

## 2015-10-16 NOTE — Progress Notes (Signed)
Patient ID: Kathleen Church, female   DOB: 1960-10-07, 55 y.o.   MRN: [id number]   Subjective:    Patient ID: Kathleen Church, female    DOB: [birth year], 55 y.o.   MRN: [id number]  Chief Complaint  Patient presents with  . Annual Exam    HPI Patient is in today for annual physical exam and follow up on numerous medical concerns. She feels welltoday and is preparing to undergo TKR of left knee tomorrow. She is ready to proceed because she would like to begin to exercise again. She is managing her grief better now a few years after her husbands sudden death. No recent illness or hospitalization. Denies CP/palp/SOB/HA/congestion/fevers/GI or GU c/o. Taking meds as prescribed. Does have trouble with mild pedal edema at times and myalgias as well.  Past Medical History:  Diagnosis Date  . Allergic state   . Anxiety    when father passed  . Arthritis   . Asthma, mild persistent    allergy induced asthma   . Cervical cancer screening 04/27/2011  . Edema 04/27/2011  . Heart murmur    as an infant   . Hyperlipidemia 04/27/2011  . Hypertension 2010  . Hypokalemia 04/27/2011  . Measles as a child  . Mumps as a child  . Muscle cramp 10/14/2015  . Obesity   . Osteoarthritis of left knee 10/14/2015  . Plantar fasciitis, left 01/19/2013  . Preventative health care 02/13/2011  . Sinusitis 02/13/2011    Past Surgical History:  Procedure Laterality Date  . ENDOMETRIAL ABLATION  2006   menorraghia  . KNEE SURGERY  2006   left knee  . TONSILLECTOMY    . 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 TUBAL LIGATION      Family History  Problem Relation Age of Onset  . Cancer Father     kidney  . Cancer Maternal Grandmother     bone  . Cancer Paternal Grandmother     breast  . Asthma Daughter   . Heart attack Paternal Grandfather   . Asthma Daughter     Social  History   Social History  . Marital status: Widowed    Spouse name: N/A  . Number of children: N/A  . Years of education: N/A   Occupational History  . Not on file.   Social History Main Topics  . Smoking status: Never Smoker  . Smokeless tobacco: Never Used  . Alcohol use Yes     Comment: occas   . Drug use: No  . Sexual activity: Yes    Partners: Male   Other Topics Concern  . Not on file   Social History Narrative  . No narrative on file    No facility-administered medications prior to visit.    Outpatient Medications Prior to Visit  Medication Sig Dispense Refill  . albuterol (PROVENTIL HFA;VENTOLIN HFA) 108 (90 BASE) MCG/ACT inhaler Inhale 2 puffs into the lungs every 6 (six) hours as needed. (Patient taking differently: Inhale 2 puffs into the lungs every 6 (six) hours as needed for wheezing or shortness of breath. ) 3 Inhaler 11  . aspirin 81 MG tablet Take 81 mg by mouth daily.    . budesonide (PULMICORT) 180 MCG/ACT inhaler Inhale 2 puffs into the lungs 2 (two) times daily. 3 Inhaler 11  . fenofibrate (TRICOR) 145 MG tablet 1 tab po  daily (Patient taking differently: Take 145 mg by mouth daily. 1 tab po daily) 90 tablet 3  . metoprolol succinate (TOPROL-XL) 25 MG 24 hr tablet TAKE 1 TABLET BY MOUTH DAILY 90 tablet 1  . montelukast (SINGULAIR) 10 MG tablet TAKE 1 TABLET (10 MG TOTAL) BY MOUTH AT BEDTIME. (Patient taking differently: Take 10 mg by mouth at bedtime. TAKE 1 TABLET (10 MG TOTAL) BY MOUTH AT BEDTIME.) 90 tablet 23  . valsartan-hydrochlorothiazide (DIOVAN HCT) 160-12.5 MG tablet Take 1 tablet by mouth daily. 90 tablet 3  . ALPRAZolam (XANAX) 1 MG tablet 1/2 to 1 tab po bid prn anxiety, palpitations (Patient not taking: Reported on 10/06/2015) 60 tablet 0  . meloxicam (MOBIC) 15 MG tablet TAKE 1 TABLET BY MOUTH EVERY DAY (Patient not taking: Reported on 10/06/2015) 30 tablet 3    Allergies  Allergen Reactions  . Codeine Palpitations and Rash    Review of  Systems  Constitutional: Negative for fever and malaise/fatigue.  HENT: Negative for congestion.   Eyes: Negative for blurred vision.  Respiratory: Negative for shortness of breath.   Cardiovascular: Negative for chest pain, palpitations and leg swelling.  Gastrointestinal: Negative for abdominal pain, blood in stool and nausea.  Genitourinary: Negative for dysuria and frequency.  Musculoskeletal: Positive for joint pain. Negative for falls.  Skin: Negative for rash.  Neurological: Negative for dizziness, loss of consciousness and headaches.  Endo/Heme/Allergies: Negative for environmental allergies.  Psychiatric/Behavioral: Negative for depression. The patient is not nervous/anxious.        Objective:    Physical Exam  Constitutional: She is oriented to person, place, and time. She appears well-developed and well-nourished. No distress.  HENT:  Head: Normocephalic and atraumatic.  Nose: Nose normal.  Eyes: Right eye exhibits no discharge. Left eye exhibits no discharge.  Neck: Normal range of motion. Neck supple.  Cardiovascular: Normal rate and regular rhythm.   No murmur heard. Pulmonary/Chest: Effort normal and breath sounds normal.  Abdominal: Soft. Bowel sounds are normal. There is no tenderness.  Musculoskeletal: She exhibits no edema.  Neurological: She is alert and oriented to person, place, and time.  Skin: Skin is warm and dry.  Psychiatric: She has a normal mood and affect.  Nursing note and vitals reviewed.   BP 122/82 (BP Location: Left Arm, Patient Position: Sitting, Cuff Size: Normal)   Pulse 88   Temp 98.5 F (36.9 C) (Oral)   Ht 5\' 8"  (1.727 m)   Wt 241 lb 3.2 oz (109.4 kg)   SpO2 93%   BMI 36.67 kg/m  Wt Readings from Last 3 Encounters:  10/15/15 241 lb (109.3 kg)  10/14/15 241 lb 3.2 oz (109.4 kg)  10/08/15 238 lb (108 kg)     Lab Results  Component Value Date   WBC 14.7 (H) 10/16/2015   HGB 11.1 (L) 10/16/2015   HCT 33.7 (L) 10/16/2015    PLT 384 10/16/2015   GLUCOSE 121 (H) 10/16/2015   CHOL 170 10/14/2015   TRIG 147.0 10/14/2015   HDL 44.30 10/14/2015   LDLDIRECT 133.4 02/08/2011   LDLCALC 96 10/14/2015   ALT 28 10/14/2015   AST 23 10/14/2015   NA 141 10/16/2015   K 3.5 10/16/2015   CL 108 10/16/2015   CREATININE 0.65 10/16/2015   BUN 14 10/16/2015   CO2 26 10/16/2015   TSH 1.10 10/14/2015   INR 1.03 10/08/2015    Lab Results  Component Value Date   TSH 1.10 10/14/2015   Lab Results  Component  Value Date   WBC 14.7 (H) 10/16/2015   HGB 11.1 (L) 10/16/2015   HCT 33.7 (L) 10/16/2015   MCV 94.7 10/16/2015   PLT 384 10/16/2015   Lab Results  Component Value Date   NA 141 10/16/2015   K 3.5 10/16/2015   CO2 26 10/16/2015   GLUCOSE 121 (H) 10/16/2015   BUN 14 10/16/2015   CREATININE 0.65 10/16/2015   BILITOT 0.5 10/14/2015   ALKPHOS 61 10/14/2015   AST 23 10/14/2015   ALT 28 10/14/2015   PROT 7.9 10/14/2015   ALBUMIN 4.1 10/14/2015   CALCIUM 8.7 (L) 10/16/2015   ANIONGAP 7 10/16/2015   GFR 94.96 10/14/2015   Lab Results  Component Value Date   CHOL 170 10/14/2015   Lab Results  Component Value Date   HDL 44.30 10/14/2015   Lab Results  Component Value Date   LDLCALC 96 10/14/2015   Lab Results  Component Value Date   TRIG 147.0 10/14/2015   Lab Results  Component Value Date   CHOLHDL 4 10/14/2015   No results found for: HGBA1C     Assessment & Plan:   Problem List Items Addressed This Visit    Hypertension    Well controlled, no changes to meds. Encouraged heart healthy diet such as the DASH diet and exercise as tolerated.       Relevant Orders   TSH (Completed)   CBC (Completed)   Comprehensive metabolic panel (Completed)   Preventative health care - Primary    Patient encouraged to maintain heart healthy diet, regular exercise, adequate sleep. Consider daily probiotics. Take medications as prescribed. Given and reviewed copy of ACP documents from [employer] of [place]  and encouraged to complete and return. 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 Consider Compression hose      Hyperlipidemia    Encouraged heart healthy diet, increase exercise, avoid trans fats, consider a krill oil cap daily. May consider Fenofibrate      Relevant Orders   Lipid panel (Completed)   Tachycardia    RRR today      Primary osteoarthritis of one knee, left    Has surgery tomorrow with Dr Thomasena Edisollins of ortho for TKR      Muscle cramp    Infrequent. Increase hydration, consider magnesium and try Hyland's leg cramp med.        Other Visit Diagnoses   None.     I have discontinued Ms. Olenick's meloxicam and ALPRAZolam. I am also having her maintain her aspirin, valsartan-hydrochlorothiazide, montelukast, fenofibrate, budesonide, albuterol, and metoprolol succinate.  No orders of the defined types were placed in this encounter.    Danise EdgeBLYTH, Angline Schweigert, MD

## 2015-10-16 NOTE — Care Management Note (Signed)
Case Management Note  Patient Details  Name: Bary LericheKimberly K Erdman MRN: [id number] Date of Birth: 03/16/1960  Subjective/Objective:                  LEFT TOTAL KNEE ARTHROPLASTY (Left) Action/Plan: Discharge planning Expected Discharge Date:  10/16/15               Expected Discharge Plan:  Home w Home Health Services  In-House Referral:     Discharge planning Services  CM Consult  Post Acute Care Choice:  Home Health Choice offered to:  Patient  DME Arranged:  N/A DME Agency:  NA  HH Arranged:  PT HH [facility]  Status of Service:  Completed, signed off  If discussed at [employer] Meetings, dates discussed:    Additional Comments: CM spoke with pt to offer choice of home health agency. Pt chooses [facility] to render HHPT.  [place] [facility] [place]  Pt states she has a rolling walker at home and has an elevated commode with support when standing up.  NO other CM needs were communicated. Yves DillJeffries, Bladen Umar Christine, RN 10/16/2015, 9:03 AM

## 2015-10-16 NOTE — Progress Notes (Signed)
   10/16/15 1600  PT Visit Information  Last PT Received On 10/16/15  Assistance Needed +1  History of Present Illness s/p L TKA  Subjective Data  Patient Stated Goal home tomorrow  Precautions  Precautions Fall;Knee  Required Braces or Orthoses Knee Immobilizer - Left  Restrictions  Other Position/Activity Restrictions WBAT  Pain Assessment  Pain Assessment 0-10  Pain Score 5  Pain Location L knee  Pain Descriptors / Indicators Grimacing;Guarding  Pain Intervention(s) Limited activity within patient's tolerance;Monitored during session;Premedicated before session;Ice applied  Cognition  Arousal/Alertness Awake/alert  Behavior During Therapy WFL for tasks assessed/performed  Overall Cognitive Status Within Functional Limits for tasks assessed  Bed Mobility  General bed mobility comments (inchair)  Transfers  Overall transfer level Needs assistance  Equipment used Rolling walker (2 wheeled)  Sit to Stand Min guard  General transfer comment for safety  Ambulation/Gait  Ambulation/Gait assistance Min guard  Ambulation Distance (Feet) 60 Feet  Assistive device Rolling walker (2 wheeled)  Gait Pattern/deviations Step-to pattern  General Gait Details cues for sequence and RW position  Total Joint Exercises  Ankle Circles/Pumps AROM;Both;10 reps  Quad Sets AROM;Strengthening;Both;10 reps  Heel Slides AAROM;Left;10 reps  Hip ABduction/ADduction AROM;AAROM;Left;10 reps  Straight Leg Raises AAROM;AROM;Left;10 reps  Goniometric ROM -8 to 55* left knee flexion  PT - End of Session  Equipment Utilized During Treatment Left knee immobilizer  Activity Tolerance Patient tolerated treatment well  Patient left in chair;with call bell/phone within reach;with family/visitor present  PT - Assessment/Plan  PT Plan Current plan remains appropriate  PT Frequency (ACUTE ONLY) 7X/week  Follow Up Recommendations Home health PT  PT equipment None recommended by PT  PT Goal Progression   Progress towards PT goals Progressing toward goals  Acute Rehab PT Goals  PT Goal Formulation With patient  Time For Goal Achievement 10/19/15  Potential to Achieve Goals Good  PT Time Calculation  PT Start Time (ACUTE ONLY) 1335  PT Stop Time (ACUTE ONLY) 1402  PT Time Calculation (min) (ACUTE ONLY) 27 min  PT General Charges  $$ ACUTE PT VISIT 1 Procedure  PT Treatments  $Gait Training 8-22 mins  $Therapeutic Exercise 8-22 mins

## 2015-10-16 NOTE — Progress Notes (Signed)
Kathleen Church reports oxycodone makes Kathleen Church nauseous and Kathleen Church threw up with last dose. Kathleen Church reports Dr. Thomasena Edisollins is sending her home with Percocet. Nurse explained to Kathleen Church nurse will page provider on call to see if pain medication can be changed to Percocet. [facility]

## 2015-10-16 NOTE — Assessment & Plan Note (Signed)
Resolved with labwork today

## 2015-10-16 NOTE — Clinical Social Work Note (Signed)
Disposition: Home with Home health PT  CSW signing off.  Vickii PennaGina Lateria Alderman, MSW, LCSW  [phone]  Licensed Clinical Social Worker

## 2015-10-16 NOTE — Evaluation (Signed)
Physical Therapy Evaluation Patient Details Name: Kathleen Church MRN: [id number] DOB: 1960-07-08 Today's Date: 10/16/2015   History of Present Illness  s/p L TKA  Clinical Impression  Pt is s/p TKA resulting in the deficits listed below (see PT Problem List).   Pt will benefit from skilled PT to increase their independence and safety with mobility to allow discharge to the venue listed below.  Should progress well, wants to go home tomorrow    Follow Up Recommendations Home health PT    Equipment Recommendations  None recommended by PT    Recommendations for Other Services       Precautions / Restrictions Precautions Precautions: Fall Required Braces or Orthoses: Knee Immobilizer - Left Restrictions Weight Bearing Restrictions: No Other Position/Activity Restrictions: WBAT      Mobility  Bed Mobility Overal bed mobility: Needs Assistance Bed Mobility: Supine to Sit     Supine to sit: Min assist     General bed mobility comments: assist with LLE, cues for technique and self assist, incr time  Transfers Overall transfer level: Needs assistance Equipment used: Rolling walker (2 wheeled) Transfers: Sit to/from [employer] Sit to Stand: Min assist Stand pivot transfers: Min assist       General transfer comment: incr time and cues for technique  Ambulation/Gait Ambulation/Gait assistance: Min assist Ambulation Distance (Feet): 30 Feet Assistive device: Rolling walker (2 wheeled) Gait Pattern/deviations: Step-to pattern;Decreased step length - right;Decreased step length - left;Antalgic;Trunk flexed     General Gait Details: cues for sequence and RW position  Stairs            Wheelchair Mobility    Modified Rankin (Stroke Patients Only)       Balance                                             Pertinent Vitals/Pain Pain Assessment: 0-10 Pain Score: 5  Pain Location: L knee Pain Descriptors / Indicators:  Sore Pain Intervention(s): Limited activity within patient's tolerance;Monitored during session;Premedicated before session    Home Living Family/patient expects to be discharged to:: Private residence Living Arrangements: Children Available Help at Discharge: Family Type of Home: House Home Access: Stairs to enter Entrance Stairs-Rails: None Entrance Stairs-Number of Steps: 2   Home Equipment: Environmental consultant - 2 wheels;Shower seat;Toilet riser      Prior Function Level of Independence: Independent               Hand Dominance        Extremity/Trunk Assessment   Upper Extremity Assessment: Defer to OT evaluation;Overall [facility] for tasks assessed           Lower Extremity Assessment: LLE deficits/detail   LLE Deficits / Details: ankle WFL; knee extension and hip flexion 2/5, limited by post op pain and weakness     Communication      Cognition Arousal/Alertness: Awake/alert Behavior During Therapy: WFL for tasks assessed/performed Overall Cognitive Status: Within Functional Limits for tasks assessed                      General Comments      Exercises Total Joint Exercises Ankle Circles/Pumps: AROM;Both;5 reps Quad Sets: 5 reps;Both;AROM   Assessment/Plan    PT Assessment Patient needs continued PT services  PT Problem List Decreased strength;Decreased range of motion;Decreased activity tolerance;Decreased mobility;Decreased safety awareness;Pain;Decreased knowledge  of use of DME;Decreased knowledge of precautions          PT Treatment Interventions DME instruction;Gait training;Functional mobility training;Stair training;Therapeutic activities;Therapeutic exercise;Patient/family education    PT Goals (Current goals can be found in the Care Plan section)  Acute Rehab PT Goals Patient Stated Goal: home tomorrow PT Goal Formulation: With patient Time For Goal Achievement: 10/19/15 Potential to Achieve Goals: Good    Frequency 7X/week   Barriers  to discharge        Co-evaluation               End of Session Equipment Utilized During Treatment: Gait belt Activity Tolerance: Patient tolerated treatment well Patient left: in chair;with call bell/phone within reach;with family/visitor present           Time: 1001-1037 PT Time Calculation (min) (ACUTE ONLY): 36 min   Charges:   PT Evaluation $PT Eval Low Complexity: 1 Procedure PT Treatments $Gait Training: 8-22 mins   PT G Codes:        Sharlette Jansma 10/16/2015, 11:34 AM

## 2015-10-16 NOTE — Progress Notes (Signed)
Kathleen SequinBryson Church returned call to nurse. Nurse took verbal order for Percocet (Roxicet) 5-325mg , 1-2 tablets, as needed every 4 hours for severe pain. Nurse ordered pain medication and will discontinue [facility]codone order [facility].

## 2015-10-17 ENCOUNTER — Inpatient Hospital Stay (HOSPITAL_COMMUNITY): Payer: 59

## 2015-10-17 ENCOUNTER — Other Ambulatory Visit: Payer: Self-pay | Admitting: Family Medicine

## 2015-10-17 DIAGNOSIS — M79609 Pain in unspecified limb: Secondary | ICD-10-CM

## 2015-10-17 LAB — CBC
HEMATOCRIT: 32.6 % — AB (ref 36.0–46.0)
Hemoglobin: 10.5 g/dL — ABNORMAL LOW (ref 12.0–15.0)
MCH: 30.7 pg (ref 26.0–34.0)
MCHC: 32.2 g/dL (ref 30.0–36.0)
MCV: 95.3 fL (ref 78.0–100.0)
Platelets: 366 10*3/uL (ref 150–400)
RBC: 3.42 MIL/uL — ABNORMAL LOW (ref 3.87–5.11)
RDW: 13.2 % (ref 11.5–15.5)
WBC: 14.5 10*3/uL — AB (ref 4.0–10.5)

## 2015-10-17 MED ORDER — OXYCODONE-ACETAMINOPHEN 5-325 MG PO TABS: 1.0000 | ORAL_TABLET | ORAL | 0 refills | Status: DC | PRN

## 2015-10-17 NOTE — Progress Notes (Signed)
Physical Therapy Treatment Patient Details Name: Kathleen LericheKimberly K Payano MRN: [id number] DOB: 11/04/1960 Today's Date: 10/17/2015    History of Present Illness s/p L TKA    PT Comments    Pt progressing well with mobility, nausea much better today;    Pt with marked tenderness/pain to touch L calf region, absent erythema, mild edema L lower leg; discussed with RN;   Follow Up Recommendations  Home health PT     Equipment Recommendations  None recommended by PT    Recommendations for Other Services       Precautions / Restrictions Precautions Precautions: Fall;Knee Required Braces or Orthoses: Knee Immobilizer - Left Restrictions Weight Bearing Restrictions: No Other Position/Activity Restrictions: WBAT    Mobility  Bed Mobility Overal bed mobility: Needs Assistance Bed Mobility: Supine to Sit     Supine to sit: Min assist     General bed mobility comments: assist with LLE, cues for technique and self assist, incr time  Transfers Overall transfer level: Needs assistance Equipment used: Standard walker (pt's own walker) Transfers: Sit to/from Stand Sit to Stand: Min guard         General transfer comment: cues for hand placement  Ambulation/Gait Ambulation/Gait assistance: Min guard;Supervision Ambulation Distance (Feet): 90 Feet Assistive device: Rolling walker (2 wheeled) Gait Pattern/deviations: Step-to pattern;Trunk flexed;Decreased weight shift to left     General Gait Details: cues for sequence and RW position   Stairs Stairs: Yes Stairs assistance: Min assist Stair Management: No rails;Step to pattern;Backwards;With walker Number of Stairs: 2 General stair comments: cues for sequence and technique  Wheelchair Mobility    Modified Rankin (Stroke Patients Only)       Balance                                    Cognition Arousal/Alertness: Awake/alert Behavior During Therapy: WFL for tasks assessed/performed Overall  Cognitive Status: Within Functional Limits for tasks assessed                      Exercises      General Comments        Pertinent Vitals/Pain Pain Assessment: 0-10 Pain Score: 4  Pain Location: L knee Pain Descriptors / Indicators: Guarding;Grimacing;Sore Pain Intervention(s): Limited activity within patient's tolerance;Monitored during session;Premedicated before session;Ice applied;Repositioned    Home Living                      Prior Function            PT Goals (current goals can now be found in the care plan section) Acute Rehab PT Goals Patient Stated Goal: home tomorrow PT Goal Formulation: With patient Time For Goal Achievement: 10/19/15 Potential to Achieve Goals: Good Progress towards PT goals: Progressing toward goals    Frequency    7X/week      PT Plan Current plan remains appropriate    Co-evaluation             End of Session Equipment Utilized During Treatment: Left knee immobilizer;Gait belt Activity Tolerance: Patient tolerated treatment well Patient left: in chair;with call bell/phone within reach;with family/visitor present     Time: [id number] PT Time Calculation (min) (ACUTE ONLY): 35 min  Charges:                       G Codes:  [facility] 10/17/2015, 10:05 AM

## 2015-10-17 NOTE — Progress Notes (Signed)
*  Preliminary Results* Left lower extremity venous duplex completed. Left lower extremity is negative for deep vein thrombosis. There is no evidence of left Baker's cyst.  10/17/2015 12:04 PM  Gertie FeyMichelle Jaquelynn Wanamaker, BS, RVT, RDCS, RDMS

## 2015-10-17 NOTE — Progress Notes (Signed)
Page returned. Doppler study ordered & arranged. Kathleen Church, Kathleen Church

## 2015-10-17 NOTE — Progress Notes (Signed)
Discharged from floor via w/c for transport home by car. Family and belongings with pt. No changes in assessment. Leana [place], [employer] Bath [employer]

## 2015-10-17 NOTE — Progress Notes (Signed)
   Subjective: 2 Days Post-Op Procedure(s) (LRB): LEFT TOTAL KNEE ARTHROPLASTY (Left) Patient reports pain as mild.   Patient seen in rounds for Dr. Thomasena Edisollins. Patient is well, but has had some minor complaints of pain in the knee, requiring pain medications Patient is ready to go home  Objective: Vital signs in last 24 hours: Temp:  [98.5 F (36.9 C)-99.1 F (37.3 C)] 99.1 F (37.3 C) (10/08 0620) Pulse Rate:  [72-88] 72 (10/08 0620) Resp:  [15-16] 16 (10/08 0620) BP: (107-144)/(52-88) 107/52 (10/08 0620) SpO2:  [83 %-97 %] 97 % (10/08 0620)  Intake/Output from previous day:  Intake/Output Summary (Last 24 hours) at 10/17/15 0751 Last data filed at 10/16/15 2213  Gross per 24 hour  Intake              720 ml  Output              860 ml  Net             -140 ml    Intake/Output this shift: No intake/output data recorded.  Labs:  Recent Labs  10/14/15 0949 10/16/15 0439 10/17/15 0448  HGB 13.5 11.1* 10.5*    Recent Labs  10/16/15 0439 10/17/15 0448  WBC 14.7* 14.5*  RBC 3.56* 3.42*  HCT 33.7* 32.6*  PLT 384 366    Recent Labs  10/14/15 0949 10/16/15 0439  NA 144 141  K 3.9 3.5  CL 104 108  CO2 31 26  BUN 15 14  CREATININE 0.68 0.65  GLUCOSE 91 121*  CALCIUM 9.5 8.7*   No results for input(s): LABPT, INR in the last 72 hours.  EXAM: General - Patient is Alert, Appropriate and Oriented Extremity - Neurovascular intact Sensation intact distally Dorsiflexion/Plantar flexion intact Incision - clean, dry, no drainage Motor Function - intact, moving foot and toes well on exam.   Assessment/Plan: 2 Days Post-Op Procedure(s) (LRB): LEFT TOTAL KNEE ARTHROPLASTY (Left) Procedure(s) (LRB): LEFT TOTAL KNEE ARTHROPLASTY (Left) Past Medical History:  Diagnosis Date  . Allergic state   . Anxiety    when father passed  . Arthritis   . Asthma, mild persistent    allergy induced asthma   . Cervical cancer screening 04/27/2011  . Edema 04/27/2011  .  Heart murmur    as an infant   . Hyperlipidemia 04/27/2011  . Hypertension 2010  . Hypokalemia 04/27/2011  . Measles as a child  . Mumps as a child  . Muscle cramp 10/14/2015  . Obesity   . Osteoarthritis of left knee 10/14/2015  . Plantar fasciitis, left 01/19/2013  . Preventative health care 02/13/2011  . Sinusitis 02/13/2011   Active Problems:   S/P knee replacement  Estimated body mass index is 36.64 kg/m as calculated from the following:   Height as of this encounter: 5\' 8"  (1.727 m).   Weight as of this encounter: 109.3 kg (241 lb). Up with therapy Discharge home with home health Diet - Cardiac diet Follow up - in 2 weeks Activity - WBAT Disposition - Home Condition Upon Discharge - Good D/C Meds - See DC Summary DVT Prophylaxis - Aspirin  Avel Peacerew Cheryll Keisler, PA-C Orthopaedic Surgery 10/17/2015, 7:51 AM

## 2015-10-17 NOTE — Progress Notes (Signed)
During assessment found to be very tender to lateral left calf on palpation. No redness or heat. PA paged. Eloisa Chokshi, [employer]

## 2015-10-17 NOTE — Progress Notes (Signed)
Doppler to LLE negative for DVT per tech. Will proceed with discharge. Darcell Yacoub, [employer]

## 2015-10-20 ENCOUNTER — Telehealth: Payer: Self-pay | Admitting: Family Medicine

## 2015-10-20 NOTE — Telephone Encounter (Signed)
Relation to RU:EAVWpt:self Call back number:[phone]   Reason for call:  Patient inquiring about lab results and would like to discuss medication change. Please advise

## 2015-10-21 NOTE — Telephone Encounter (Signed)
I released her labs to [facility] last week. They looked good, no changes recommended. What is the change she is interested in?

## 2015-10-22 NOTE — Telephone Encounter (Signed)
Patient informed of resutls/she was only referring to changes if lab warranted so.

## 2015-11-03 NOTE — Discharge Summary (Signed)
Physician Discharge Summary   Patient ID: Kathleen Church MRN: [id number] DOB/AGE: 56/16/1962 56 y.o.  Admit date: 10/15/2015 Discharge date: 10/17/2015  Primary Diagnosis:  LEFT KNEE OA Admission Diagnoses:  Past Medical History:  Diagnosis Date  . Allergic state   . Anxiety    when father passed  . Arthritis   . Asthma, mild persistent    allergy induced asthma   . Cervical cancer screening 04/27/2011  . Edema 04/27/2011  . Heart murmur    as an infant   . Hyperlipidemia 04/27/2011  . Hypertension 2010  . Hypokalemia 04/27/2011  . Measles as a child  . Mumps as a child  . Muscle cramp 10/14/2015  . Obesity   . Osteoarthritis of left knee 10/14/2015  . Plantar fasciitis, left 01/19/2013  . Preventative health care 02/13/2011  . Sinusitis 02/13/2011   Discharge Diagnoses:   Active Problems:   S/P knee replacement  Estimated body mass index is 36.64 kg/m as calculated from the following:   Height as of this encounter: 5' 8"  (1.727 m).   Weight as of this encounter: 109.3 kg (241 lb).  Procedure:  Procedure(s) (LRB): LEFT TOTAL KNEE ARTHROPLASTY (Left)   Consults: None  HPI: Kathleen Church is a 55 y.o. 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   The risks, benefits, and alternatives were discussed at length including but not limited to the risks of infection, bleeding, nerve injury, stiffness, blood clots, the need for revision surgery, cardiopulmonary complications, among others, and they were willing to proceed. Laboratory Data: Admission on 10/15/2015, Discharged on 10/17/2015  Component Date Value Ref Range Status  . WBC 10/16/2015 14.7* 4.0 - 10.5 K/uL Final  . RBC 10/16/2015 3.56* 3.87 - 5.11 MIL/uL Final  . Hemoglobin 10/16/2015 11.1* 12.0 - 15.0 g/dL Final  . HCT 10/16/2015 33.7* 36.0 - 46.0 % Final  . MCV 10/16/2015 94.7  78.0 - 100.0 fL Final  . MCH 10/16/2015  31.2  26.0 - 34.0 pg Final  . MCHC 10/16/2015 32.9  30.0 - 36.0 g/dL Final  . RDW 10/16/2015 12.8  11.5 - 15.5 % Final  . Platelets 10/16/2015 384  150 - 400 K/uL Final  . Sodium 10/16/2015 141  135 - 145 mmol/L Final  . Potassium 10/16/2015 3.5  3.5 - 5.1 mmol/L Final  . Chloride 10/16/2015 108  101 - 111 mmol/L Final  . CO2 10/16/2015 26  22 - 32 mmol/L Final  . Glucose, Bld 10/16/2015 121* 65 - 99 mg/dL Final  . BUN 10/16/2015 14  6 - 20 mg/dL Final  . Creatinine, Ser 10/16/2015 0.65  0.44 - 1.00 mg/dL Final  . Calcium 10/16/2015 8.7* 8.9 - 10.3 mg/dL Final  . GFR calc non Af Amer 10/16/2015 >60  >60 mL/min Final  . GFR calc Af Amer 10/16/2015 >60  >60 mL/min Final   Comment: (NOTE) The eGFR has been calculated using the CKD EPI equation. This calculation has not been validated in all clinical situations. eGFR's persistently <60 mL/min signify possible Chronic Kidney Disease.   . Anion gap 10/16/2015 7  5 - 15 Final  . WBC 10/17/2015 14.5* 4.0 - 10.5 K/uL Final  . RBC 10/17/2015 3.42* 3.87 - 5.11 MIL/uL Final  . Hemoglobin 10/17/2015 10.5* 12.0 - 15.0 g/dL Final  . HCT 10/17/2015 32.6* 36.0 - 46.0 % Final  . MCV 10/17/2015 95.3  78.0 - 100.0 fL  Final  . [place] 10/17/2015 30.7  26.0 - 34.0 pg Final  . MCHC 10/17/2015 32.2  30.0 - 36.0 g/dL Final  . RDW 10/17/2015 13.2  11.5 - 15.5 % Final  . Platelets 10/17/2015 366  150 - 400 K/uL Final  Office Visit on 10/14/2015  Component Date Value Ref Range Status  . TSH 10/14/2015 1.10  0.35 - 4.50 uIU/mL Final  . WBC 10/14/2015 9.6  4.0 - 10.5 K/uL Final  . RBC 10/14/2015 4.36  3.87 - 5.11 Mil/uL Final  . Platelets 10/14/2015 417.0* 150.0 - 400.0 K/uL Final  . Hemoglobin 10/14/2015 13.5  12.0 - 15.0 g/dL Final  . HCT 10/14/2015 39.8  36.0 - 46.0 % Final  . MCV 10/14/2015 91.3  78.0 - 100.0 fl Final  . MCHC 10/14/2015 34.0  30.0 - 36.0 g/dL Final  . RDW 10/14/2015 12.7  11.5 - 15.5 % Final  . Cholesterol 10/14/2015 170  0 - 200 mg/dL  Final  . Triglycerides 10/14/2015 147.0  0.0 - 149.0 mg/dL Final  . HDL 10/14/2015 44.30  >39.00 mg/dL Final  . VLDL 10/14/2015 29.4  0.0 - 40.0 mg/dL Final  . LDL Cholesterol 10/14/2015 96  0 - 99 mg/dL Final  . Total CHOL/HDL Ratio 10/14/2015 4   Final  . NonHDL 10/14/2015 125.29   Final  . Sodium 10/14/2015 144  135 - 145 mEq/L Final  . Potassium 10/14/2015 3.9  3.5 - 5.1 mEq/L Final  . Chloride 10/14/2015 104  96 - 112 mEq/L Final  . CO2 10/14/2015 31  19 - 32 mEq/L Final  . Glucose, Bld 10/14/2015 91  70 - 99 mg/dL Final  . BUN 10/14/2015 15  6 - 23 mg/dL Final  . Creatinine, Ser 10/14/2015 0.68  0.40 - 1.20 mg/dL Final  . Total Bilirubin 10/14/2015 0.5  0.2 - 1.2 mg/dL Final  . Alkaline Phosphatase 10/14/2015 61  39 - 117 U/L Final  . AST 10/14/2015 23  0 - 37 U/L Final  . ALT 10/14/2015 28  0 - 35 U/L Final  . Total Protein 10/14/2015 7.9  6.0 - 8.3 g/dL Final  . Albumin 10/14/2015 4.1  3.5 - 5.2 g/dL Final  . Calcium 10/14/2015 9.5  8.4 - 10.5 mg/dL Final  . GFR 10/14/2015 94.96  >60.00 mL/min Final  Hospital Outpatient Visit on 10/11/2015  Component Date Value Ref Range Status  . Preg Test, Ur 10/11/2015 NEGATIVE  NEGATIVE Final   Comment:        THE SENSITIVITY OF THIS METHODOLOGY IS >20 mIU/mL.   Hospital Outpatient Visit on 10/11/2015  Component Date Value Ref Range Status  . aPTT 10/08/2015 35  24 - 36 seconds Final  . Sodium 10/08/2015 141  135 - 145 mmol/L Final  . Potassium 10/08/2015 3.2* 3.5 - 5.1 mmol/L Final  . Chloride 10/08/2015 105  101 - 111 mmol/L Final  . CO2 10/08/2015 26  22 - 32 mmol/L Final  . Glucose, Bld 10/08/2015 86  65 - 99 mg/dL Final  . BUN 10/08/2015 18  6 - 20 mg/dL Final  . Creatinine, Ser 10/08/2015 0.68  0.44 - 1.00 mg/dL Final  . Calcium 10/08/2015 9.5  8.9 - 10.3 mg/dL Final  . GFR calc non Af Amer 10/08/2015 >60  >60 mL/min Final  . GFR calc Af Amer 10/08/2015 >60  >60 mL/min Final   Comment: (NOTE) The eGFR has been calculated  using the CKD EPI equation. This calculation has not been validated in all clinical  situations. eGFR's persistently <60 mL/min signify possible Chronic Kidney Disease.   . Anion gap 10/08/2015 10  5 - 15 Final  . WBC 10/08/2015 7.7  4.0 - 10.5 K/uL Final  . RBC 10/08/2015 4.29  3.87 - 5.11 MIL/uL Final  . Hemoglobin 10/08/2015 13.3  12.0 - 15.0 g/dL Final  . HCT 10/08/2015 40.4  36.0 - 46.0 % Final  . MCV 10/08/2015 94.2  78.0 - 100.0 fL Final  . MCH 10/08/2015 31.0  26.0 - 34.0 pg Final  . MCHC 10/08/2015 32.9  30.0 - 36.0 g/dL Final  . RDW 10/08/2015 12.9  11.5 - 15.5 % Final  . Platelets 10/08/2015 392  150 - 400 K/uL Final  . Prothrombin Time 10/08/2015 13.5  11.4 - 15.2 seconds Final  . INR 10/08/2015 1.03   Final  . ABO/RH(D) 10/15/2015 A POS   Final  . Antibody Screen 10/15/2015 NEG   Final  . Sample Expiration 10/15/2015 10/18/2015   Final  . Extend sample reason 10/15/2015 NO TRANSFUSIONS OR PREGNANCY IN THE PAST 3 MONTHS   Final  . Color, Urine 10/08/2015 YELLOW  YELLOW Final  . APPearance 10/08/2015 CLOUDY* CLEAR Final  . Specific Gravity, Urine 10/08/2015 1.020  1.005 - 1.030 Final  . pH 10/08/2015 6.5  5.0 - 8.0 Final  . Glucose, UA 10/08/2015 NEGATIVE  NEGATIVE mg/dL Final  . Hgb urine dipstick 10/08/2015 TRACE* NEGATIVE Final  . Bilirubin Urine 10/08/2015 NEGATIVE  NEGATIVE Final  . Ketones, ur 10/08/2015 NEGATIVE  NEGATIVE mg/dL Final  . Protein, ur 10/08/2015 NEGATIVE  NEGATIVE mg/dL Final  . Nitrite 10/08/2015 NEGATIVE  NEGATIVE Final  . Leukocytes, UA 10/08/2015 SMALL* NEGATIVE Final  . MRSA, PCR 10/08/2015 NEGATIVE  NEGATIVE Final  . Staphylococcus aureus 10/08/2015 NEGATIVE  NEGATIVE Final   Comment:        The Xpert SA Assay (FDA approved for NASAL specimens in patients over 57 years of age), is one component of a comprehensive surveillance program.  Test performance has been validated by [facility] for patients greater than or equal to 27 year  old. It is not intended to diagnose infection nor to guide or monitor treatment.   . Preg, Serum 10/08/2015 WEAK POSITIVE* NEGATIVE Final   Comment: Weak positive results should be confirmed with second sample obtained after 48 hours.  For immediate confirmation, order quantitative hCG.        THE SENSITIVITY OF THIS METHODOLOGY IS >10 mIU/mL.   . ABO/RH(D) 10/08/2015 A POS   Final  . Squamous Epithelial / LPF 10/08/2015 6-30* NONE SEEN Final  . WBC, UA 10/08/2015 6-30  0 - 5 WBC/hpf Final  . RBC / HPF 10/08/2015 0-5  0 - 5 RBC/hpf Final  . Bacteria, UA 10/08/2015 MANY* NONE SEEN Final  . Urine-Other 10/08/2015 MUCOUS PRESENT   Final     X-Rays:No results found.  EKG: Orders placed or performed in visit on 08/05/15  . EKG 12-Lead     Hospital Course: Kathleen Church is a 55 y.o. who was admitted to [facility]. They were brought to the operating room on 10/15/2015 and underwent Procedure(s): LEFT TOTAL KNEE ARTHROPLASTY.  Patient tolerated the procedure well and was later transferred to the recovery room and then to the orthopaedic floor for postoperative care.  They were given PO and IV analgesics for pain control following their surgery.  They were given 24 hours of postoperative antibiotics of  Anti-infectives    Start     Dose/Rate  Route Frequency Ordered Stop   10/15/15 1400  ceFAZolin (ANCEF) IVPB 2g/100 mL premix     2 g 200 mL/hr over 30 Minutes Intravenous Every 6 hours 10/15/15 1115 10/15/15 2051     and started on DVT prophylaxis in the form of Lovenox.   PT and OT were ordered for total joint protocol.  Discharge planning consulted to help with postop disposition and equipment needs.  Patient had a decent night on the evening of surgery.  They started to get up OOB with therapy on day one. Hemovac drain was pulled without difficulty.  Continued to work with therapy into day two.  Dressing was checked on day two and it was clean and dry.  Patient was seen in  rounds on POD 2 and was ready to go home.  Discharge home with home health Diet - Cardiac diet Follow up - in 2 weeks Activity - WBAT Disposition - Home Condition Upon Discharge - Good D/C Meds - See DC Summary DVT Prophylaxis - Aspirin  Discharge Instructions    Call MD / Call 911    Complete by:  As directed    If you experience chest pain or shortness of breath, CALL 911 and be transported to the hospital emergency room.  If you develope a fever above 101 F, pus (white drainage) or increased drainage or redness at the wound, or calf pain, call your surgeon's office.   Constipation Prevention    Complete by:  As directed    Drink plenty of fluids.  Prune juice may be helpful.  You may use a stool softener, such as Colace (over the counter) 100 mg twice a day.  Use MiraLax (over the counter) for constipation as needed.   Diet - low sodium heart healthy    Complete by:  As directed    Discharge instructions    Complete by:  As directed    INSTRUCTIONS AFTER JOINT REPLACEMENT   Remove items at home which could result in a fall. This includes throw rugs or furniture in walking pathways ICE to the affected joint every three hours while awake for 30 minutes at a time, for at least the first 3-5 days, and then as needed for pain and swelling.  Continue to use ice for pain and swelling. You may notice swelling that will progress down to the foot and ankle.  This is normal after surgery.  Elevate your leg when you are not up walking on it.   Continue to use the breathing machine you got in the hospital (incentive spirometer) which will help keep your temperature down.  It is common for your temperature to cycle up and down following surgery, especially at night when you are not up moving around and exerting yourself.  The breathing machine keeps your lungs expanded and your temperature down.   DIET:  As you were doing prior to hospitalization, we recommend a well-balanced diet.  DRESSING /  WOUND CARE / SHOWERING  Keep the surgical dressing until follow up.  The dressing is water proof, so you can shower without any extra covering.  IF THE DRESSING FALLS OFF or the wound gets wet inside, change the dressing with sterile gauze.  Please use good hand washing techniques before changing the dressing.  Do not use any lotions or creams on the incision until instructed by your surgeon.    ACTIVITY  Increase activity slowly as tolerated, but follow the weight bearing instructions below.   No driving for 6 weeks  or until further direction given by your physician.  You cannot drive while taking narcotics.  No lifting or carrying greater than 10 lbs. until further directed by your surgeon. Avoid periods of inactivity such as sitting longer than an hour when not asleep. This helps prevent blood clots.  You may return to work once you are authorized by your doctor.     WEIGHT BEARING   Weight bearing as tolerated with assist device (walker, cane, etc) as directed, use it as long as suggested by your surgeon or therapist, typically at least 4-6 weeks.   EXERCISES  Results after joint replacement surgery are often greatly improved when you follow the exercise, range of motion and muscle strengthening exercises prescribed by your doctor. Safety measures are also important to protect the joint from further injury. Any time any of these exercises cause you to have increased pain or swelling, decrease what you are doing until you are comfortable again and then slowly increase them. If you have problems or questions, call your caregiver or physical therapist for advice.   Rehabilitation is important following a joint replacement. After just a few days of immobilization, the muscles of the leg can become weakened and shrink (atrophy).  These exercises are designed to build up the tone and strength of the thigh and leg muscles and to improve motion. Often times heat used for twenty to thirty minutes  before working out will loosen up your tissues and help with improving the range of motion but do not use heat for the first two weeks following surgery (sometimes heat can increase post-operative swelling).   These exercises can be done on a training (exercise) mat, on the floor, on a table or on a bed. Use whatever works the best and is most comfortable for you.    Use music or television while you are exercising so that the exercises are a pleasant break in your day. This will make your life better with the exercises acting as a break in your routine that you can look forward to.   Perform all exercises about fifteen times, three times per day or as directed.  You should exercise both the operative leg and the other leg as well.   Exercises include:   Quad Sets - Tighten up the muscle on the front of the thigh (Quad) and hold for 5-10 seconds.   Straight Leg Raises - With your knee straight (if you were given a brace, keep it on), lift the leg to 60 degrees, hold for 3 seconds, and slowly lower the leg.  Perform this exercise against resistance later as your leg gets stronger.  Leg Slides: Lying on your back, slowly slide your foot toward your buttocks, bending your knee up off the floor (only go as far as is comfortable). Then slowly slide your foot back down until your leg is flat on the floor again.  Angel Wings: Lying on your back spread your legs to the side as far apart as you can without causing discomfort.  Hamstring Strength:  Lying on your back, push your heel against the floor with your leg straight by tightening up the muscles of your buttocks.  Repeat, but this time bend your knee to a comfortable angle, and push your heel against the floor.  You may put a pillow under the heel to make it more comfortable if necessary.   A rehabilitation program following joint replacement surgery can speed recovery and prevent re-injury in the future due to weakened  muscles. Contact your doctor or a  physical therapist for more information on knee rehabilitation.    CONSTIPATION  Constipation is defined medically as fewer than three stools per week and severe constipation as less than one stool per week.  Even if you have a regular bowel pattern at home, your normal regimen is likely to be disrupted due to multiple reasons following surgery.  Combination of anesthesia, postoperative narcotics, change in appetite and fluid intake all can affect your bowels.   YOU MUST use at least one of the following options; they are listed in order of increasing strength to get the job done.  They are all available over the counter, and you may need to use some, POSSIBLY even all of these options:    Drink plenty of fluids (prune juice may be helpful) and high fiber foods Colace 100 mg by mouth twice a day  Senokot for constipation as directed and as needed Dulcolax (bisacodyl), take with full glass of water  Miralax (polyethylene glycol) once or twice a day as needed.  If you have tried all these things and are unable to have a bowel movement in the first 3-4 days after surgery call either your surgeon or your primary doctor.    If you experience loose stools or diarrhea, hold the medications until you stool forms back up.  If your symptoms do not get better within 1 week or if they get worse, check with your doctor.  If you experience "the worst abdominal pain ever" or develop nausea or vomiting, please contact the office immediately for further recommendations for treatment.   ITCHING:  If you experience itching with your medications, try taking only a single pain pill, or even half a pain pill at a time.  You can also use Benadryl over the counter for itching or also to help with sleep.   TED HOSE STOCKINGS:  Use stockings on both legs until for at least 2 weeks or as directed by physician office. They may be removed at night for sleeping.  MEDICATIONS:  See your medication summary on the "After  Visit Summary" that nursing will review with you.  You may have some home medications which will be placed on hold until you complete the course of blood thinner medication.  It is important for you to complete the blood thinner medication as prescribed.  PRECAUTIONS:  If you experience chest pain or shortness of breath - call 911 immediately for transfer to the hospital emergency department.   If you develop a fever greater that 101 F, purulent drainage from wound, increased redness or drainage from wound, foul odor from the wound/dressing, or calf pain - CONTACT YOUR SURGEON.                                                   FOLLOW-UP APPOINTMENTS:  If you do not already have a post-op appointment, please call the office for an appointment to be seen by your surgeon.  Guidelines for how soon to be seen are listed in your "After Visit Summary", but are typically between 1-4 weeks after surgery.  OTHER INSTRUCTIONS:   Knee Replacement:  Do not place pillow under knee, focus on keeping the knee straight while resting. CPM instructions: 0-90 degrees, 2 hours in the morning, 2 hours in the afternoon, and 2 hours in the  evening. Place foam block, curve side up under heel at all times except when in CPM or when walking.  DO NOT modify, tear, cut, or change the foam block in any way.  MAKE SURE YOU:  Understand these instructions.  Get help right away if you are not doing well or get worse.    Thank you for letting us be a part of your medical care team.  It is a privilege we respect greatly.  We hope these instructions will help you stay on track for a fast and full recovery!   Increase activity slowly as tolerated    Complete by:  As directed        Medication List    STOP taking these medications   aspirin 81 MG tablet Replaced by:  aspirin EC 325 MG tablet     TAKE these medications   albuterol 108 (90 Base) MCG/ACT inhaler Commonly known as:  PROVENTIL HFA;VENTOLIN HFA Inhale 2 puffs  into the lungs every 6 (six) hours as needed. What changed:  reasons to take this   aspirin EC 325 MG tablet Take 1 tablet (325 mg total) by mouth 2 (two) times daily. Replaces:  aspirin 81 MG tablet   budesonide 180 MCG/ACT inhaler Commonly known as:  PULMICORT Inhale 2 puffs into the lungs 2 (two) times daily.   methocarbamol 500 MG tablet Commonly known as:  ROBAXIN Take 1 tablet (500 mg total) by mouth every 8 (eight) hours as needed for muscle spasms.   metoprolol succinate 25 MG 24 hr tablet Commonly known as:  TOPROL-XL TAKE 1 TABLET BY MOUTH DAILY   montelukast 10 MG tablet Commonly known as:  SINGULAIR TAKE 1 TABLET (10 MG TOTAL) BY MOUTH AT BEDTIME. What changed:  how much to take  how to take this  when to take this  additional instructions   oxyCODONE-acetaminophen 5-325 MG tablet Commonly known as:  PERCOCET/ROXICET Take 1-2 tablets by mouth every 4 (four) hours as needed for severe pain.   valsartan-hydrochlorothiazide 160-12.5 MG tablet Commonly known as:  DIOVAN HCT Take 1 tablet by mouth daily.      Follow-up Information    [facility] .   Specialty:  [place] Why:  you will receive a call from this agency to schedule your at home physical therapy Contact information: [place] [address] [phone]           Signed: Arlee Muslim, PA-C Orthopaedic Surgery 11/03/2015, 10:48 PM

## 2015-11-25 ENCOUNTER — Other Ambulatory Visit: Payer: Self-pay | Admitting: Family Medicine

## 2015-12-01 ENCOUNTER — Other Ambulatory Visit: Payer: Self-pay | Admitting: Family Medicine

## 2015-12-01 NOTE — Telephone Encounter (Signed)
Refill sent per [facility] refill protocol/SLS  

## 2016-01-11 ENCOUNTER — Ambulatory Visit (INDEPENDENT_AMBULATORY_CARE_PROVIDER_SITE_OTHER): Payer: 59 | Admitting: Family Medicine

## 2016-01-11 ENCOUNTER — Encounter: Payer: Self-pay | Admitting: Family Medicine

## 2016-01-11 VITALS — BP 142/80 | HR 103 | Temp 98.3°F | Resp 17 | Ht 68.0 in | Wt 238.0 lb

## 2016-01-11 DIAGNOSIS — R0602 Shortness of breath: Secondary | ICD-10-CM

## 2016-01-11 DIAGNOSIS — R059 Cough, unspecified: Secondary | ICD-10-CM

## 2016-01-11 DIAGNOSIS — J069 Acute upper respiratory infection, unspecified: Secondary | ICD-10-CM | POA: Diagnosis not present

## 2016-01-11 DIAGNOSIS — R05 Cough: Secondary | ICD-10-CM | POA: Diagnosis not present

## 2016-01-11 MED ORDER — ALBUTEROL SULFATE HFA 108 (90 BASE) MCG/ACT IN AERS: 2.0000 | INHALATION_SPRAY | Freq: Four times a day (QID) | RESPIRATORY_TRACT | 2 refills | Status: DC | PRN

## 2016-01-11 MED ORDER — LEVOCETIRIZINE DIHYDROCHLORIDE 5 MG PO TABS: 5.0000 mg | ORAL_TABLET | Freq: Every evening | ORAL | 5 refills | Status: DC

## 2016-01-11 MED ORDER — PROMETHAZINE-DM 6.25-15 MG/5ML PO SYRP: 5.0000 mL | ORAL_SOLUTION | Freq: Four times a day (QID) | ORAL | 0 refills | Status: DC | PRN

## 2016-01-11 MED ORDER — FLUTICASONE PROPIONATE 50 MCG/ACT NA SUSP: 2.0000 | Freq: Every day | NASAL | 6 refills | Status: DC

## 2016-01-11 MED ORDER — ALBUTEROL SULFATE (2.5 MG/3ML) 0.083% IN NEBU
2.5000 mg | INHALATION_SOLUTION | Freq: Once | RESPIRATORY_TRACT | Status: AC
  Administered 2016-01-11: 2.5 mg via RESPIRATORY_TRACT

## 2016-01-11 MED ORDER — AZITHROMYCIN 250 MG PO TABS: ORAL_TABLET | ORAL | 0 refills | Status: DC

## 2016-01-11 NOTE — Progress Notes (Signed)
Pre visit review using our clinic review tool, if applicable. No additional management support is needed unless otherwise documented below in the visit note. 

## 2016-01-11 NOTE — Patient Instructions (Signed)
Upper Respiratory Infection, Adult Most upper respiratory infections (URIs) are a viral infection of the air passages leading to the lungs. A URI affects the nose, throat, and upper air passages. The most common type of URI is nasopharyngitis and is typically referred to as "the common cold." URIs run their course and usually go away on their own. Most of the time, a URI does not require medical attention, but sometimes a bacterial infection in the upper airways can follow a viral infection. This is called a secondary infection. Sinus and middle ear infections are common types of secondary upper respiratory infections. Bacterial pneumonia can also complicate a URI. A URI can worsen asthma and chronic obstructive pulmonary disease (COPD). Sometimes, these complications can require emergency medical care and may be life threatening. What are the causes? Almost all URIs are caused by viruses. A virus is a type of germ and can spread from one person to another. What increases the risk? You may be at risk for a URI if:  You smoke.  You have chronic heart or lung disease.  You have a weakened defense (immune) system.  You are very young or very old.  You have nasal allergies or asthma.  You work in crowded or poorly ventilated areas.  You work in health care facilities or schools.  What are the signs or symptoms? Symptoms typically develop 2-3 days after you come in contact with a cold virus. Most viral URIs last 7-10 days. However, viral URIs from the influenza virus (flu virus) can last 14-18 days and are typically more severe. Symptoms may include:  Runny or stuffy (congested) nose.  Sneezing.  Cough.  Sore throat.  Headache.  Fatigue.  Fever.  Loss of appetite.  Pain in your forehead, behind your eyes, and over your cheekbones (sinus pain).  Muscle aches.  How is this diagnosed? Your health care provider may diagnose a URI by:  Physical exam.  Tests to check that your  symptoms are not due to another condition such as: ? Strep throat. ? Sinusitis. ? Pneumonia. ? Asthma.  How is this treated? A URI goes away on its own with time. It cannot be cured with medicines, but medicines may be prescribed or recommended to relieve symptoms. Medicines may help:  Reduce your fever.  Reduce your cough.  Relieve nasal congestion.  Follow these instructions at home:  Take medicines only as directed by your health care provider.  Gargle warm saltwater or take cough drops to comfort your throat as directed by your health care provider.  Use a warm mist humidifier or inhale steam from a shower to increase air moisture. This may make it easier to breathe.  Drink enough fluid to keep your urine clear or pale yellow.  Eat soups and other clear broths and maintain good nutrition.  Rest as needed.  Return to work when your temperature has returned to normal or as your health care provider advises. You may need to stay home longer to avoid infecting others. You can also use a face mask and careful hand washing to prevent spread of the virus.  Increase the usage of your inhaler if you have asthma.  Do not use any tobacco products, including cigarettes, chewing tobacco, or electronic cigarettes. If you need help quitting, ask your health care provider. How is this prevented? The best way to protect yourself from getting a cold is to practice good hygiene.  Avoid oral or hand contact with people with cold symptoms.  Wash your   hands often if contact occurs.  There is no clear evidence that vitamin C, vitamin E, echinacea, or exercise reduces the chance of developing a cold. However, it is always recommended to get plenty of rest, exercise, and practice good nutrition. Contact a health care provider if:  You are getting worse rather than better.  Your symptoms are not controlled by medicine.  You have chills.  You have worsening shortness of breath.  You have  brown or red mucus.  You have yellow or brown nasal discharge.  You have pain in your face, especially when you bend forward.  You have a fever.  You have swollen neck glands.  You have pain while swallowing.  You have white areas in the back of your throat. Get help right away if:  You have severe or persistent: ? Headache. ? Ear pain. ? Sinus pain. ? Chest pain.  You have chronic lung disease and any of the following: ? Wheezing. ? Prolonged cough. ? Coughing up blood. ? A change in your usual mucus.  You have a stiff neck.  You have changes in your: ? Vision. ? Hearing. ? Thinking. ? Mood. This information is not intended to replace advice given to you by your health care provider. Make sure you discuss any questions you have with your health care provider. Document Released: 06/21/2000 Document Revised: 08/29/2015 Document Reviewed: 04/02/2013 Elsevier Interactive Patient Education  2017 [employer]  

## 2016-01-11 NOTE — Progress Notes (Signed)
Patient ID: Kathleen Church, female    DOB: [birth year]  Age: 56 y.o. MRN: [id number]    Subjective:  Subjective  HPI Kathleen Church presents for cough, productive, ear and sinus pain x since Friday.  Pt is taking mucinex , delsym and motrin with little relief.    Review of Systems  Constitutional: Positive for chills. Negative for fever.  HENT: Positive for congestion, postnasal drip, rhinorrhea and sinus pressure.   Respiratory: Positive for cough, chest tightness, shortness of breath and wheezing.   Cardiovascular: Negative for chest pain, palpitations and leg swelling.  Allergic/Immunologic: Negative for environmental allergies.    History Past Medical History:  Diagnosis Date  . Allergic state   . Anxiety    when father passed  . Arthritis   . Asthma, mild persistent    allergy induced asthma   . Cervical cancer screening 04/27/2011  . Edema 04/27/2011  . Heart murmur    as an infant   . Hyperlipidemia 04/27/2011  . Hypertension 2010  . Hypokalemia 04/27/2011  . Measles as a child  . Mumps as a child  . Muscle cramp 10/14/2015  . Obesity   . Osteoarthritis of left knee 10/14/2015  . Plantar fasciitis, left 01/19/2013  . Preventative health care 02/13/2011  . Sinusitis 02/13/2011    She has a past surgical history that includes Endometrial ablation (2006); Knee surgery (2006); Tonsillectomy; Tubal ligation; and Total knee arthroplasty (Left, 10/15/2015).   Her family history includes Asthma in her daughter and daughter; Cancer in her father, maternal grandmother, and paternal grandmother; Heart attack in her paternal grandfather.She reports that she has never smoked. She has never used smokeless tobacco. She reports that she drinks alcohol. She reports that she does not use drugs.  Current Outpatient Prescriptions on File Prior to Visit  Medication Sig Dispense Refill  . albuterol (PROVENTIL HFA;VENTOLIN HFA) 108 (90 BASE) MCG/ACT inhaler Inhale 2 puffs into the lungs every 6  (six) hours as needed. (Patient taking differently: Inhale 2 puffs into the lungs every 6 (six) hours as needed for wheezing or shortness of breath. ) 3 Inhaler 11  . aspirin EC 325 MG tablet Take 1 tablet (325 mg total) by mouth 2 (two) times daily. 60 tablet 0  . budesonide (PULMICORT) 180 MCG/ACT inhaler Inhale 2 puffs into the lungs 2 (two) times daily. 3 Inhaler 11  . fenofibrate (TRICOR) 145 MG tablet TAKE 1 TABLET BY MOUTH EVERY DAY 90 tablet 3  . methocarbamol (ROBAXIN) 500 MG tablet Take 1 tablet (500 mg total) by mouth every 8 (eight) hours as needed for muscle spasms. 50 tablet 2  . metoprolol succinate (TOPROL-XL) 25 MG 24 hr tablet TAKE 1 TABLET BY MOUTH DAILY 90 tablet 1  . montelukast (SINGULAIR) 10 MG tablet TAKE 1 TABLET BY MOUTH AT BEDTIME. 90 tablet 1  . oxyCODONE-acetaminophen (PERCOCET/ROXICET) 5-325 MG tablet Take 1-2 tablets by mouth every 4 (four) hours as needed for severe pain. 60 tablet 0  . valsartan-hydrochlorothiazide (DIOVAN HCT) 160-12.5 MG tablet Take 1 tablet by mouth daily. 90 tablet 3  . valsartan-hydrochlorothiazide (DIOVAN-HCT) 160-12.5 MG tablet TAKE 1 TABLET BY MOUTH DAILY. 90 tablet 1   No current facility-administered medications on file prior to visit.      Objective:  Objective  Physical Exam  Constitutional: She is oriented to person, place, and time. She appears well-developed and well-nourished.  HENT:  Right Ear: External ear normal.  Left Ear: External ear normal.  Nose: Mucosal edema and  sinus tenderness present. Right sinus exhibits no maxillary sinus tenderness and no frontal sinus tenderness. Left sinus exhibits no maxillary sinus tenderness and no frontal sinus tenderness.  Mouth/Throat: Oropharyngeal exudate and posterior oropharyngeal erythema present.  + PND + errythema  Eyes: Conjunctivae are normal. Right eye exhibits no discharge. Left eye exhibits no discharge.  Cardiovascular: Normal rate, regular rhythm and normal heart sounds.    No murmur heard. Pulmonary/Chest: Effort normal and breath sounds normal. No respiratory distress. She has no wheezes. She has no rales. She exhibits no tenderness.  Musculoskeletal: She exhibits no edema.  Lymphadenopathy:    She has no cervical adenopathy.  Neurological: She is alert and oriented to person, place, and time.  Nursing note and vitals reviewed.  BP (!) 142/80 (BP Location: Left Arm, Patient Position: Sitting, Cuff Size: Large)   Pulse (!) 103   Temp 98.3 F (36.8 C)   Resp 17   Ht 5\' 8"  (1.727 m)   Wt 238 lb (108 kg)   SpO2 95%   BMI 36.19 kg/m  Wt Readings from Last 3 Encounters:  01/11/16 238 lb (108 kg)  10/15/15 241 lb (109.3 kg)  10/14/15 241 lb 3.2 oz (109.4 kg)     Lab Results  Component Value Date   WBC 14.5 (H) 10/17/2015   HGB 10.5 (L) 10/17/2015   HCT 32.6 (L) 10/17/2015   PLT 366 10/17/2015   GLUCOSE 121 (H) 10/16/2015   CHOL 170 10/14/2015   TRIG 147.0 10/14/2015   HDL 44.30 10/14/2015   LDLDIRECT 133.4 02/08/2011   LDLCALC 96 10/14/2015   ALT 28 10/14/2015   AST 23 10/14/2015   NA 141 10/16/2015   K 3.5 10/16/2015   CL 108 10/16/2015   CREATININE 0.65 10/16/2015   BUN 14 10/16/2015   CO2 26 10/16/2015   TSH 1.10 10/14/2015   INR 1.03 10/08/2015    No results found.   Assessment & Plan:  Plan  I am having Kathleen Church start on fluticasone, levocetirizine, promethazine-dextromethorphan, albuterol, and azithromycin. I am also having her maintain her valsartan-hydrochlorothiazide, budesonide, albuterol, metoprolol succinate, methocarbamol, aspirin EC, fenofibrate, oxyCODONE-acetaminophen, valsartan-hydrochlorothiazide, and montelukast. We administered albuterol.  Meds ordered this encounter  Medications  . albuterol (PROVENTIL) (2.5 MG/3ML) 0.083% nebulizer solution 2.5 mg  . fluticasone (FLONASE) 50 MCG/ACT nasal spray    Sig: Place 2 sprays into both nostrils daily.    Dispense:  16 g    Refill:  6  . levocetirizine (XYZAL) 5 MG  tablet    Sig: Take 1 tablet (5 mg total) by mouth every evening.    Dispense:  30 tablet    Refill:  5  . promethazine-dextromethorphan (PROMETHAZINE-DM) 6.25-15 MG/5ML syrup    Sig: Take 5 mLs by mouth 4 (four) times daily as needed.    Dispense:  118 mL    Refill:  0  . albuterol (VENTOLIN HFA) 108 (90 Base) MCG/ACT inhaler    Sig: Inhale 2 puffs into the lungs every 6 (six) hours as needed for wheezing or shortness of breath.    Dispense:  1 Inhaler    Refill:  2  . azithromycin (ZITHROMAX Z-PAK) 250 MG tablet    Sig: As directed    Dispense:  6 each    Refill:  0    Problem List Items Addressed This Visit    None    Visit Diagnoses    Cough    -  Primary   Relevant Medications   albuterol (PROVENTIL) (  2.5 MG/3ML) 0.083% nebulizer solution 2.5 mg (Completed)   promethazine-dextromethorphan (PROMETHAZINE-DM) 6.25-15 MG/5ML syrup   albuterol (VENTOLIN HFA) 108 (90 Base) MCG/ACT inhaler   SOB (shortness of breath)       Relevant Medications   albuterol (PROVENTIL) (2.5 MG/3ML) 0.083% nebulizer solution 2.5 mg (Completed)   Acute upper respiratory infection       Relevant Medications   fluticasone (FLONASE) 50 MCG/ACT nasal spray   levocetirizine (XYZAL) 5 MG tablet   promethazine-dextromethorphan (PROMETHAZINE-DM) 6.25-15 MG/5ML syrup   azithromycin (ZITHROMAX Z-PAK) 250 MG tablet      Follow-up: Return if symptoms worsen or fail to improve.  Donato Schultz, DO

## 2016-01-14 DIAGNOSIS — Z96652 Presence of left artificial knee joint: Secondary | ICD-10-CM | POA: Diagnosis not present

## 2016-01-20 DIAGNOSIS — M1712 Unilateral primary osteoarthritis, left knee: Secondary | ICD-10-CM | POA: Diagnosis not present

## 2016-01-25 DIAGNOSIS — M1712 Unilateral primary osteoarthritis, left knee: Secondary | ICD-10-CM | POA: Diagnosis not present

## 2016-02-23 ENCOUNTER — Other Ambulatory Visit: Payer: Self-pay | Admitting: Family Medicine

## 2016-03-11 IMAGING — MR MR FOOT*L* W/O CM
4 of 6 series · 16 of 40 positions shown · non-contrast
Comparison: Office radiographs 03/20/2013.

CLINICAL DATA: Left heel pain and burning for 1 year. No acute
injury or prior relevant surgery. History of plantar fasciitis.

EXAM:
MRI OF THE LEFT HINDFOOT WITHOUT CONTRAST
TECHNIQUE: Multiplanar, multisequence MR imaging of the ankle was performed. No
intravenous contrast was administered.

[Series 4: PD fat-sat · axial · 3.5mm · 0.28mm/px · z∈[-55,+67]mm · 7 of 30 slices shown]
[im 1/30]
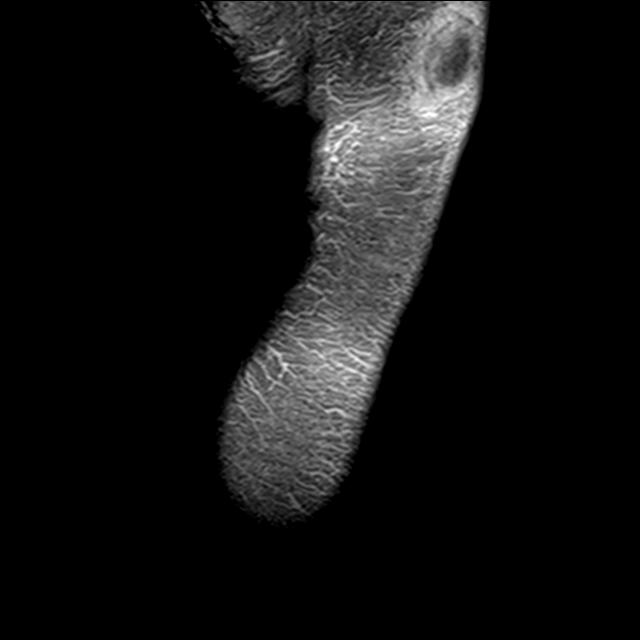
[im 5/30]
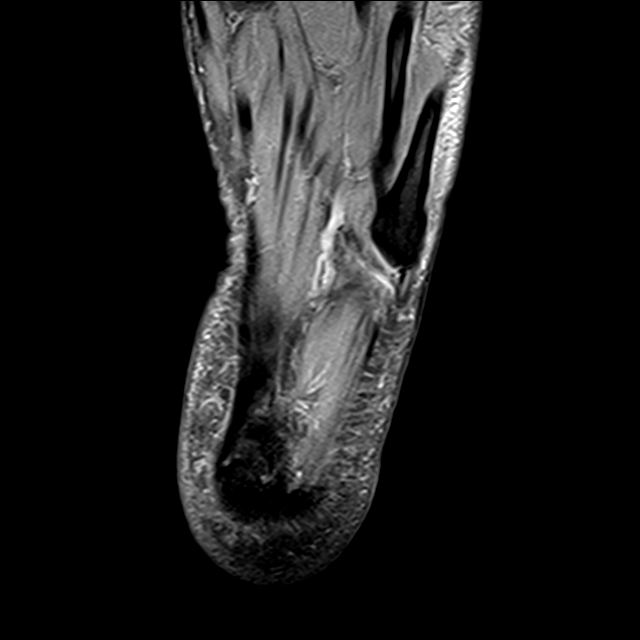
[im 10/30]
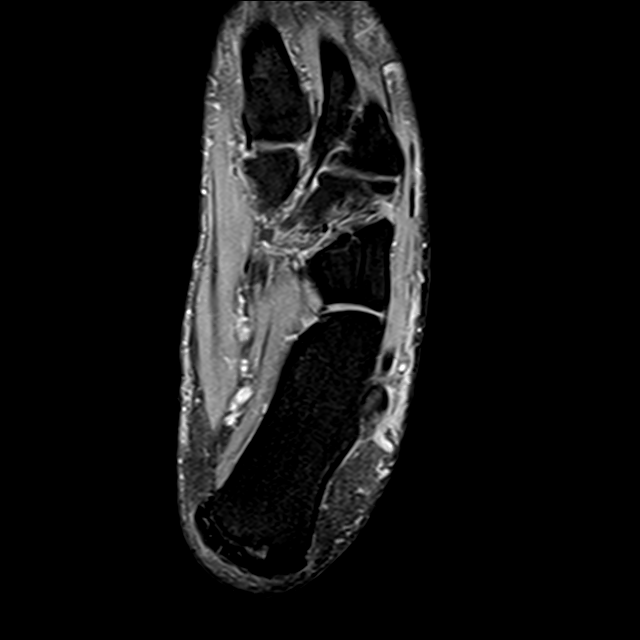
[im 15/30]
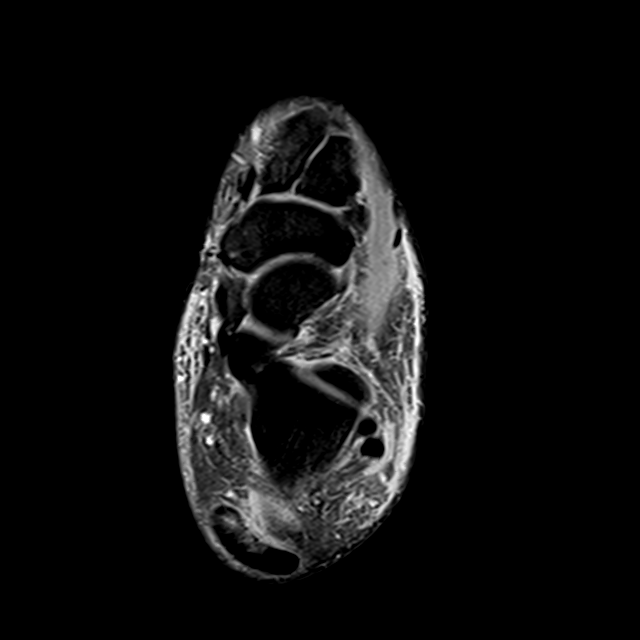
[im 20/30]
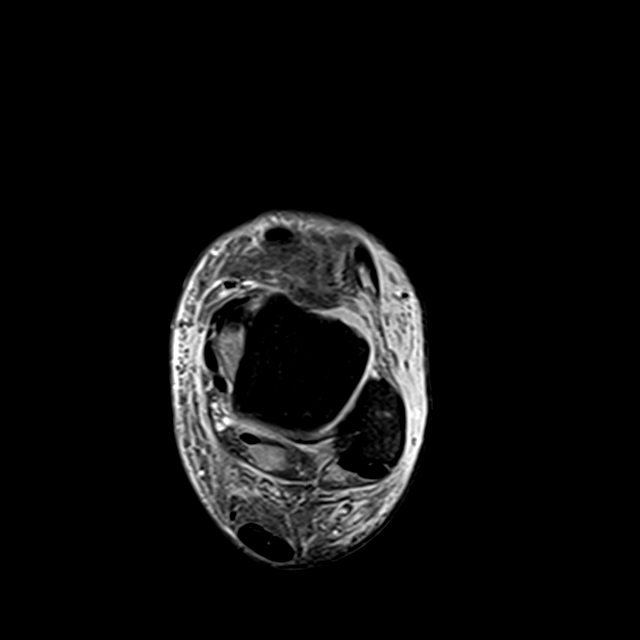
[im 25/30]
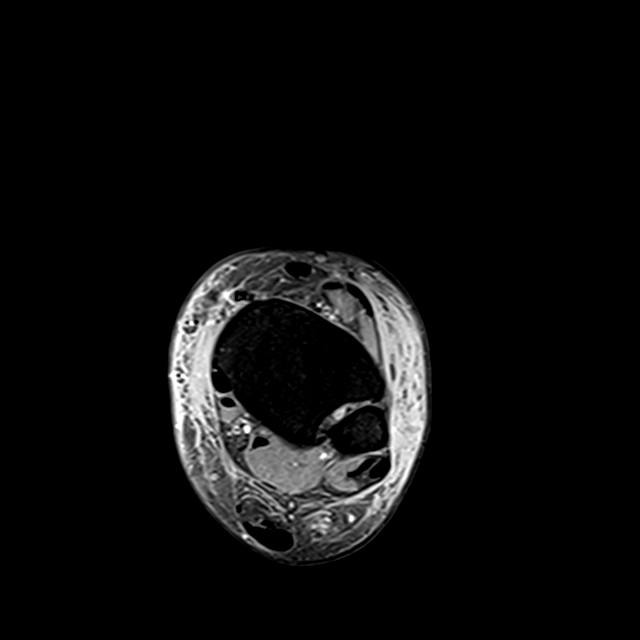
[im 30/30]
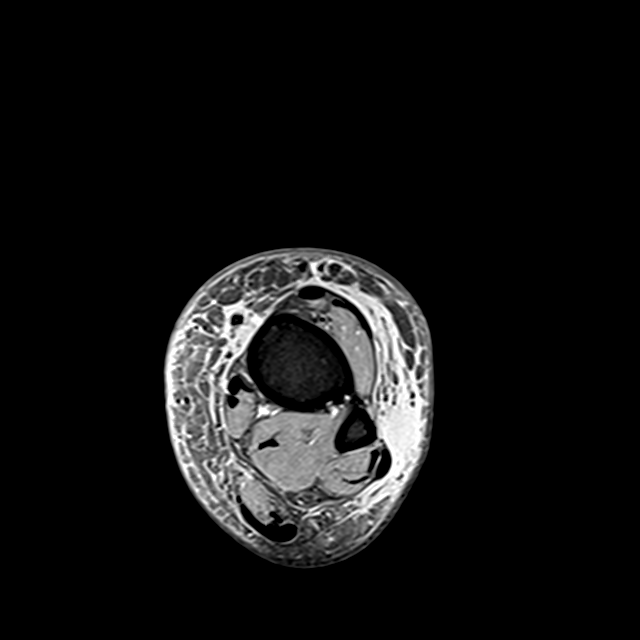

[Series 5: T2 fat-sat · axial · 3.5mm · 0.28mm/px · z∈[-38,+46]mm · 3 of 30 slices shown (1 of 3)]
[im 5/30]
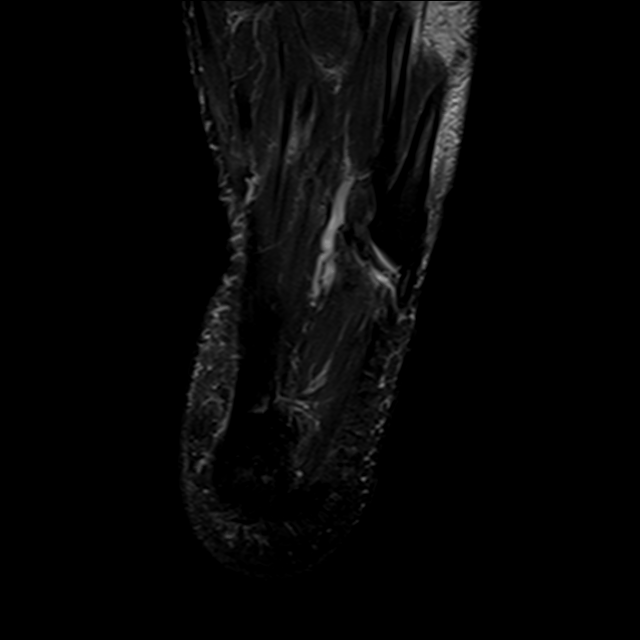
[im 15/30]
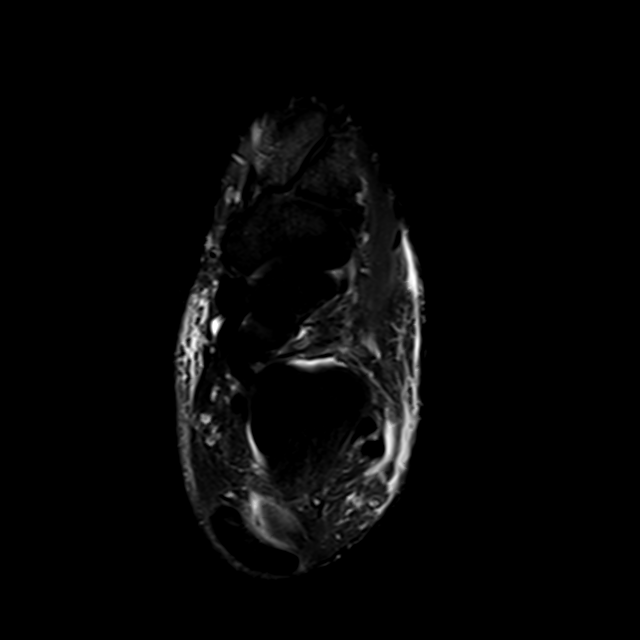
[im 25/30]
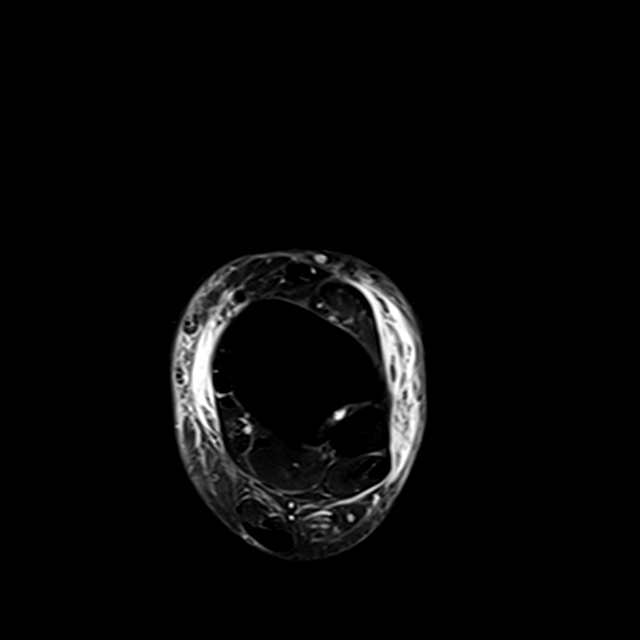

[Series 7: T2 fat-sat · sagittal · 3.0mm · 0.53mm/px · 3 of 22 slices shown (2 of 3)]
[im 5/22]
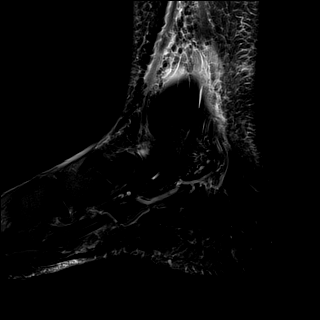
[im 13/22]
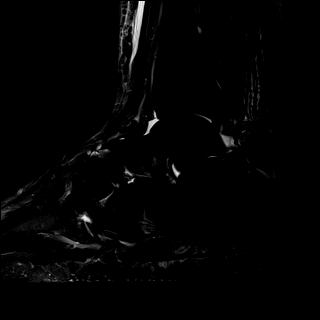
[im 22/22]
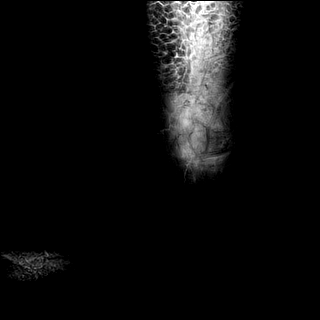

[Series 8: T2 fat-sat · coronal · 3.5mm · 0.31mm/px · 3 of 31 slices shown (3 of 3)]
[im 5/31]
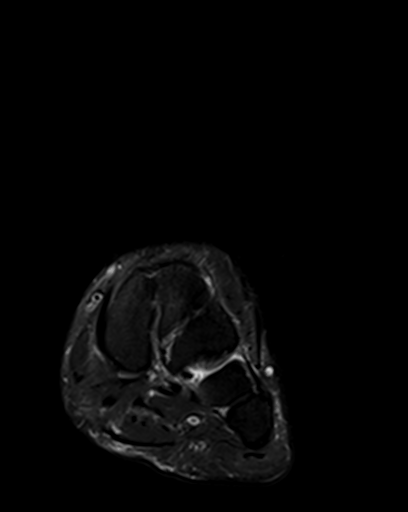
[im 18/31]
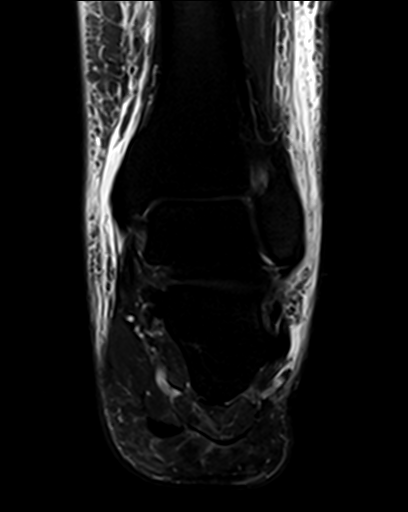
[im 26/31]
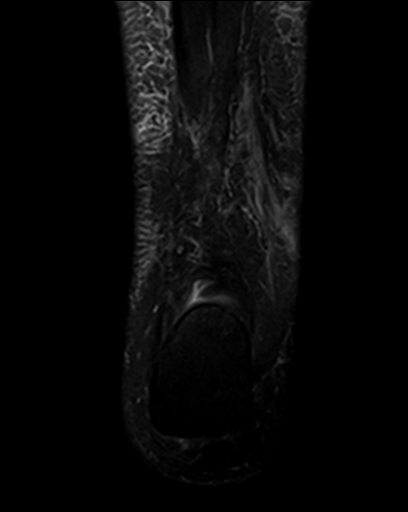

[16 of 40 positions shown; findings below may reference images not displayed]

FINDINGS: TENDONS

Peroneal: Intact and normally positioned.

Posteromedial: Intact and normally positioned.

Anterior: Intact and normally positioned.

Achilles: There is mild insertional Achilles tendinosis with
intraosseous cyst formation in the calcaneal tuberosity and a small
Haglund deformity. There is a small amount of fluid within the
retrocalcaneal bursa. No tendon tear demonstrated.

Plantar Fascia: There is a prominent plantar calcaneal spur with
thickening of the medial cord of the plantar fascia. No abnormal
signal is present within the fascia.

LIGAMENTS

Lateral: Intact.

Medial: Intact.

CARTILAGE

Ankle Joint: No significant ankle joint effusion. The talar dome and
tibial plafond are intact.

Subtalar Joints/Sinus Tarsi: The subtalar and talonavicular joints
appear normal. The tarsal sinus appears normal. There is a
collection of small ganglia along the dorsal aspect of the navicular
near the articulation with the middle and lateral cuneiforms.

Bones: No significant extra-articular osseous findings.

Other: There is moderate nonspecific subcutaneous edema surrounding
the ankle.
IMPRESSION: 1. Moderate insertional Achilles tendinosis with intraosseous cyst
formation in the calcaneal tuberosity and a mild Haglund deformity.
2. Chronic appearing thickening of the medial cord of the plantar
fascia adjacent to a plantar calcaneal spur. No evidence of active
plantar fasciitis or fascial rupture.
3. Small ganglia dorsal to the navicular.
4. Moderate subcutaneous edema surrounding the ankle.

## 2016-04-13 ENCOUNTER — Ambulatory Visit: Payer: 59 | Admitting: Family Medicine

## 2016-04-17 ENCOUNTER — Ambulatory Visit: Payer: 59 | Admitting: Family Medicine

## 2016-04-19 DIAGNOSIS — Z471 Aftercare following joint replacement surgery: Secondary | ICD-10-CM | POA: Diagnosis not present

## 2016-04-19 DIAGNOSIS — M1711 Unilateral primary osteoarthritis, right knee: Secondary | ICD-10-CM | POA: Diagnosis not present

## 2016-04-19 DIAGNOSIS — Z96652 Presence of left artificial knee joint: Secondary | ICD-10-CM | POA: Diagnosis not present

## 2016-05-12 ENCOUNTER — Ambulatory Visit (INDEPENDENT_AMBULATORY_CARE_PROVIDER_SITE_OTHER): Payer: 59 | Admitting: Family Medicine

## 2016-05-12 DIAGNOSIS — E785 Hyperlipidemia, unspecified: Secondary | ICD-10-CM

## 2016-05-12 DIAGNOSIS — I10 Essential (primary) hypertension: Secondary | ICD-10-CM | POA: Diagnosis not present

## 2016-05-12 DIAGNOSIS — R609 Edema, unspecified: Secondary | ICD-10-CM | POA: Diagnosis not present

## 2016-05-12 DIAGNOSIS — T7840XD Allergy, unspecified, subsequent encounter: Secondary | ICD-10-CM | POA: Diagnosis not present

## 2016-05-12 DIAGNOSIS — E6609 Other obesity due to excess calories: Secondary | ICD-10-CM | POA: Diagnosis not present

## 2016-05-12 LAB — LIPID PANEL
CHOL/HDL RATIO: 3
Cholesterol: 180 mg/dL (ref 0–200)
HDL: 55.9 mg/dL (ref >39.00)
LDL CALC: 105 mg/dL — AB (ref 0–99)
NONHDL: 123.93
Triglycerides: 95 mg/dL (ref 0.0–149.0)
VLDL: 19 mg/dL (ref 0.0–40.0)

## 2016-05-12 LAB — CBC WITH DIFFERENTIAL/PLATELET
BASOS ABS: 0.1 10*3/uL (ref 0.0–0.1)
Basophils Relative: 0.7 % (ref 0.0–3.0)
EOS ABS: 0.2 10*3/uL (ref 0.0–0.7)
Eosinophils Relative: 2.8 % (ref 0.0–5.0)
HCT: 40.3 % (ref 36.0–46.0)
Hemoglobin: 13.9 g/dL (ref 12.0–15.0)
LYMPHS ABS: 1.3 10*3/uL (ref 0.7–4.0)
Lymphocytes Relative: 16.3 % (ref 12.0–46.0)
MCHC: 34.5 g/dL (ref 30.0–36.0)
MCV: 91.5 fl (ref 78.0–100.0)
MONO ABS: 0.7 10*3/uL (ref 0.1–1.0)
Monocytes Relative: 8.3 % (ref 3.0–12.0)
NEUTROS ABS: 5.8 10*3/uL (ref 1.4–7.7)
NEUTROS PCT: 71.9 % (ref 43.0–77.0)
PLATELETS: 396 10*3/uL (ref 150.0–400.0)
RBC: 4.4 Mil/uL (ref 3.87–5.11)
RDW: 12.8 % (ref 11.5–15.5)
WBC: 8.1 10*3/uL (ref 4.0–10.5)

## 2016-05-12 LAB — COMPREHENSIVE METABOLIC PANEL
ALT: 28 U/L (ref 0–35)
AST: 25 U/L (ref 0–37)
Albumin: 4.3 g/dL (ref 3.5–5.2)
Alkaline Phosphatase: 61 U/L (ref 39–117)
BUN: 15 mg/dL (ref 6–23)
CO2: 29 meq/L (ref 19–32)
Calcium: 9.8 mg/dL (ref 8.4–10.5)
Chloride: 105 mEq/L (ref 96–112)
Creatinine, Ser: 0.72 mg/dL (ref 0.40–1.20)
GFR: 88.72 mL/min (ref >60.00)
GLUCOSE: 86 mg/dL (ref 70–99)
POTASSIUM: 3.9 meq/L (ref 3.5–5.1)
SODIUM: 140 meq/L (ref 135–145)
Total Bilirubin: 0.5 mg/dL (ref 0.2–1.2)
Total Protein: 7.7 g/dL (ref 6.0–8.3)

## 2016-05-12 LAB — TSH: TSH: 1.7 u[IU]/mL (ref 0.35–4.50)

## 2016-05-12 LAB — HEMOGLOBIN A1C: HEMOGLOBIN A1C: 4.8 % (ref 4.6–6.5)

## 2016-05-12 NOTE — Progress Notes (Signed)
Pre visit review using our clinic review tool, if applicable. No additional management support is needed unless otherwise documented below in the visit note. 

## 2016-05-12 NOTE — Assessment & Plan Note (Signed)
Well controlled, no changes to meds. Encouraged heart healthy diet such as the DASH diet and exercise as tolerated.  °

## 2016-05-12 NOTE — Patient Instructions (Addendum)
Place your feet above your heart for 15 minutes a day to help prevent swelling.   Preventive Care 40-64 Years, Female Preventive care refers to lifestyle choices and visits with your health care provider that can promote health and wellness. What does preventive care include?  A yearly physical exam. This is also called an annual well check.  Dental exams once or twice a year.  Routine eye exams. Ask your health care provider how often you should have your eyes checked.  Personal lifestyle choices, including:  Daily care of your teeth and gums.  Regular physical activity.  Eating a healthy diet.  Avoiding tobacco and drug use.  Limiting alcohol use.  Practicing safe sex.  Taking low-dose aspirin daily starting at age 33.  Taking vitamin and mineral supplements as recommended by your health care provider. What happens during an annual well check? The services and screenings done by your health care provider during your annual well check will depend on your age, overall health, lifestyle risk factors, and family history of disease. Counseling  Your health care provider may ask you questions about your:  Alcohol use.  Tobacco use.  Drug use.  Emotional well-being.  Home and relationship well-being.  Sexual activity.  Eating habits.  Work and work Statistician.  Method of birth control.  Menstrual cycle.  Pregnancy history. Screening  You may have the following tests or measurements:  Height, weight, and BMI.  Blood pressure.  Lipid and cholesterol levels. These may be checked every 5 years, or more frequently if you are over 59 years old.  Skin check.  Lung cancer screening. You may have this screening every year starting at age 31 if you have a 30-pack-year history of smoking and currently smoke or have quit within the past 15 years.  Fecal occult blood test (FOBT) of the stool. You may have this test every year starting at age 7.  Flexible  sigmoidoscopy or colonoscopy. You may have a sigmoidoscopy every 5 years or a colonoscopy every 10 years starting at age 17.  Hepatitis C blood test.  Hepatitis B blood test.  Sexually transmitted disease (STD) testing.  Diabetes screening. This is done by checking your blood sugar (glucose) after you have not eaten for a while (fasting). You may have this done every 1-3 years.  Mammogram. This may be done every 1-2 years. Talk to your health care provider about when you should start having regular mammograms. This may depend on whether you have a family history of breast cancer.  BRCA-related cancer screening. This may be done if you have a family history of breast, ovarian, tubal, or peritoneal cancers.  Pelvic exam and Pap test. This may be done every 3 years starting at age 75. Starting at age 26, this may be done every 5 years if you have a Pap test in combination with an HPV test.  Bone density scan. This is done to screen for osteoporosis. You may have this scan if you are at high risk for osteoporosis. Discuss your test results, treatment options, and if necessary, the need for more tests with your health care provider. Vaccines  Your health care provider may recommend certain vaccines, such as:  Influenza vaccine. This is recommended every year.  Tetanus, diphtheria, and acellular pertussis (Tdap, Td) vaccine. You may need a Td booster every 10 years.  Varicella vaccine. You may need this if you have not been vaccinated.  Zoster vaccine. You may need this after age 59.  Measles, mumps,  and rubella (MMR) vaccine. You may need at least one dose of MMR if you were born in 1957 or later. You may also need a second dose.  Pneumococcal 13-valent conjugate (PCV13) vaccine. You may need this if you have certain conditions and were not previously vaccinated.  Pneumococcal polysaccharide (PPSV23) vaccine. You may need one or two doses if you smoke cigarettes or if you have certain  conditions.  Meningococcal vaccine. You may need this if you have certain conditions.  Hepatitis A vaccine. You may need this if you have certain conditions or if you travel or work in places where you may be exposed to hepatitis A.  Hepatitis B vaccine. You may need this if you have certain conditions or if you travel or work in places where you may be exposed to hepatitis B.  Haemophilus influenzae type b (Hib) vaccine. You may need this if you have certain conditions. Talk to your health care provider about which screenings and vaccines you need and how often you need them. This information is not intended to replace advice given to you by your health care provider. Make sure you discuss any questions you have with your health care provider. Document Released: 01/22/2015 Document Revised: 09/15/2015 Document Reviewed: 10/27/2014 Elsevier Interactive Patient Education  2017 [employer].

## 2016-05-12 NOTE — Progress Notes (Signed)
Subjective:  I acted as a Neurosurgeon for Dr. Abner Greenspan. [name], [place]   Patient ID: Kathleen Church, female    DOB: 04/20/1960, 56 y.o.   MRN: [id number]  Chief Complaint  Patient presents with  . Follow-up    HPI  Patient is in today for a 6 month follow up. Following up on hypertension and other medical conditions. Patient c/o left leg edema. She denies injury, however had a total knee replacement in October 2017. She will watch her sodium, elevate the legs above her heart and hydrate well. She is happy with her increased mobility and decreased pain. Denies CP/palp/SOB/HA/congestion/fevers/GI or GU c/o. Taking meds as prescribed  Patient Care Team: Bradd Canary, MD as PCP - General (Family Medicine)   Past Medical History:  Diagnosis Date  . Allergic state   . Anxiety    when father passed  . Arthritis   . Asthma, mild persistent    allergy induced asthma   . Cervical cancer screening 04/27/2011  . Edema 04/27/2011  . Heart murmur    as an infant   . Hyperlipidemia 04/27/2011  . Hypertension 2010  . Hypokalemia 04/27/2011  . Measles as a child  . Mumps as a child  . Muscle cramp 10/14/2015  . Obesity   . Osteoarthritis of left knee 10/14/2015  . Plantar fasciitis, left 01/19/2013  . Preventative health care 02/13/2011  . Sinusitis 02/13/2011    Past Surgical History:  Procedure Laterality Date  . ENDOMETRIAL ABLATION  2006   menorraghia  . KNEE SURGERY  2006   left knee  . TONSILLECTOMY    . 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 TUBAL LIGATION      Family History  Problem Relation Age of Onset  . Cancer Father     kidney  . Cancer Maternal Grandmother     bone  . Cancer Paternal Grandmother     breast  . Asthma Daughter   . Heart attack Paternal Grandfather   . Asthma Daughter     Social History   Social History  . Marital  status: Widowed    Spouse name: N/A  . Number of children: N/A  . Years of education: N/A   Occupational History  . Not on file.   Social History Main Topics  . Smoking status: Never Smoker  . Smokeless tobacco: Never Used  . Alcohol use Yes     Comment: occas   . Drug use: No  . Sexual activity: Yes    Partners: Male   Other Topics Concern  . Not on file   Social History Narrative  . No narrative on file    Outpatient Medications Prior to Visit  Medication Sig Dispense Refill  . albuterol (VENTOLIN HFA) 108 (90 Base) MCG/ACT inhaler Inhale 2 puffs into the lungs every 6 (six) hours as needed for wheezing or shortness of breath. 1 Inhaler 2  . aspirin EC 325 MG tablet Take 1 tablet (325 mg total) by mouth 2 (two) times daily. 60 tablet 0  . budesonide (PULMICORT) 180 MCG/ACT inhaler Inhale 2 puffs into the lungs 2 (two) times daily. 3 Inhaler 11  . fenofibrate (TRICOR) 145 MG tablet TAKE 1 TABLET BY MOUTH EVERY DAY 90 tablet 3  . levocetirizine (XYZAL) 5 MG tablet Take 1 tablet (5 mg total) by mouth every evening. 30 tablet  5  . metoprolol succinate (TOPROL-XL) 25 MG 24 hr tablet TAKE 1 TABLET BY MOUTH DAILY 90 tablet 1  . montelukast (SINGULAIR) 10 MG tablet TAKE 1 TABLET BY MOUTH AT BEDTIME. 90 tablet 1  . valsartan-hydrochlorothiazide (DIOVAN-HCT) 160-12.5 MG tablet TAKE 1 TABLET BY MOUTH DAILY. 90 tablet 1  . albuterol (PROVENTIL HFA;VENTOLIN HFA) 108 (90 BASE) MCG/ACT inhaler Inhale 2 puffs into the lungs every 6 (six) hours as needed. (Patient taking differently: Inhale 2 puffs into the lungs every 6 (six) hours as needed for wheezing or shortness of breath. ) 3 Inhaler 11  . azithromycin (ZITHROMAX Z-PAK) 250 MG tablet As directed 6 each 0  . fluticasone (FLONASE) 50 MCG/ACT nasal spray Place 2 sprays into both nostrils daily. 16 g 6  . methocarbamol (ROBAXIN) 500 MG tablet Take 1 tablet (500 mg total) by mouth every 8 (eight) hours as needed for muscle spasms. 50 tablet 2   . oxyCODONE-acetaminophen (PERCOCET/ROXICET) 5-325 MG tablet Take 1-2 tablets by mouth every 4 (four) hours as needed for severe pain. 60 tablet 0  . promethazine-dextromethorphan (PROMETHAZINE-DM) 6.25-15 MG/5ML syrup Take 5 mLs by mouth 4 (four) times daily as needed. 118 mL 0  . valsartan-hydrochlorothiazide (DIOVAN HCT) 160-12.5 MG tablet Take 1 tablet by mouth daily. 90 tablet 3   No facility-administered medications prior to visit.     Allergies  Allergen Reactions  . Codeine Palpitations and Rash    Review of Systems  Constitutional: Negative for fever and malaise/fatigue.  HENT: Negative for congestion.   Eyes: Negative for blurred vision.  Respiratory: Negative for cough and shortness of breath.   Cardiovascular: Positive for leg swelling. Negative for chest pain and palpitations.  Gastrointestinal: Negative for vomiting.  Musculoskeletal: Negative for back pain.  Skin: Negative for rash.  Neurological: Negative for loss of consciousness and headaches.       Objective:    Physical Exam  Constitutional: She is oriented to person, place, and time. She appears well-developed and well-nourished. No distress.  HENT:  Head: Normocephalic and atraumatic.  Eyes: Conjunctivae are normal.  Neck: Normal range of motion. No thyromegaly present.  Cardiovascular: Normal rate and regular rhythm.   Pulmonary/Chest: Effort normal and breath sounds normal. She has no wheezes.  Abdominal: Soft. Bowel sounds are normal. There is no tenderness.  Musculoskeletal: Normal range of motion. She exhibits edema. She exhibits no deformity.  Left leg edema. Surgery 10/17.   Neurological: She is alert and oriented to person, place, and time.  Skin: Skin is warm and dry. She is not diaphoretic.  Psychiatric: She has a normal mood and affect.    There were no vitals taken for this visit. Wt Readings from Last 3 Encounters:  01/11/16 238 lb (108 kg)  10/15/15 241 lb (109.3 kg)  10/14/15 241  lb 3.2 oz (109.4 kg)   BP Readings from Last 3 Encounters:  01/11/16 (!) 142/80  10/17/15 (!) 107/52  10/14/15 122/82     Immunization History  Administered Date(s) Administered  . Influenza Whole 11/10/2010, 10/09/2012  . Pneumococcal Conjugate-13 01/17/2013  . Tdap 02/08/2011    Health Maintenance  Topic Date Due  . COLONOSCOPY  04/17/2009  . INFLUENZA VACCINE  08/09/2016  . MAMMOGRAM  09/22/2017  . PAP SMEAR  10/11/2017  . TETANUS/TDAP  02/07/2021  . Hepatitis C Screening  Completed  . HIV Screening  Completed    Lab Results  Component Value Date   WBC 8.1 05/12/2016   HGB 13.9 05/12/2016  HCT 40.3 05/12/2016   PLT 396.0 05/12/2016   GLUCOSE 86 05/12/2016   CHOL 180 05/12/2016   TRIG 95.0 05/12/2016   HDL 55.90 05/12/2016   LDLDIRECT 133.4 02/08/2011   LDLCALC 105 (H) 05/12/2016   ALT 28 05/12/2016   AST 25 05/12/2016   NA 140 05/12/2016   K 3.9 05/12/2016   CL 105 05/12/2016   CREATININE 0.72 05/12/2016   BUN 15 05/12/2016   CO2 29 05/12/2016   TSH 1.70 05/12/2016   INR 1.03 10/08/2015   HGBA1C 4.8 05/12/2016    Lab Results  Component Value Date   TSH 1.70 05/12/2016   Lab Results  Component Value Date   WBC 8.1 05/12/2016   HGB 13.9 05/12/2016   HCT 40.3 05/12/2016   MCV 91.5 05/12/2016   PLT 396.0 05/12/2016   Lab Results  Component Value Date   NA 140 05/12/2016   K 3.9 05/12/2016   CO2 29 05/12/2016   GLUCOSE 86 05/12/2016   BUN 15 05/12/2016   CREATININE 0.72 05/12/2016   BILITOT 0.5 05/12/2016   ALKPHOS 61 05/12/2016   AST 25 05/12/2016   ALT 28 05/12/2016   PROT 7.7 05/12/2016   ALBUMIN 4.3 05/12/2016   CALCIUM 9.8 05/12/2016   ANIONGAP 7 10/16/2015   GFR 88.72 05/12/2016   Lab Results  Component Value Date   CHOL 180 05/12/2016   Lab Results  Component Value Date   HDL 55.90 05/12/2016   Lab Results  Component Value Date   LDLCALC 105 (H) 05/12/2016   Lab Results  Component Value Date   TRIG 95.0 05/12/2016    Lab Results  Component Value Date   CHOLHDL 3 05/12/2016   Lab Results  Component Value Date   HGBA1C 4.8 05/12/2016         Assessment & Plan:   Problem List Items Addressed This Visit    Hypertension - Primary    Well controlled, no changes to meds. Encouraged heart healthy diet such as the DASH diet and exercise as tolerated.       Relevant Orders   Hemoglobin A1c (Completed)   Comprehensive metabolic panel (Completed)   TSH (Completed)   CBC with Differential/Platelet (Completed)   Allergic state    Good response to Singulair prn      Obesity    Encouraged DASH diet, decrease po intake and increase exercise as tolerated. Needs 7-8 hours of sleep nightly. Avoid trans fats, eat small, frequent meals every 4-5 hours with lean proteins, complex carbs and healthy fats. Minimize simple carbs, bariatric referral      Edema    Minimize sodium, elevate feet, compression hose and consider diuretics prn      Hyperlipidemia    Encouraged heart healthy diet, increase exercise, avoid trans fats, consider a krill oil cap daily      Relevant Orders   Hemoglobin A1c (Completed)   Lipid panel (Completed)      I have discontinued Ms. Tieszen's methocarbamol, oxyCODONE-acetaminophen, fluticasone, promethazine-dextromethorphan, and azithromycin. I am also having her maintain her budesonide, aspirin EC, fenofibrate, valsartan-hydrochlorothiazide, montelukast, levocetirizine, albuterol, and metoprolol succinate.  No orders of the defined types were placed in this encounter.   CMA served as Neurosurgeon during this visit. History, Physical and Plan performed by medical provider. Documentation and orders reviewed and attested to.  Danise Edge, MD

## 2016-05-15 NOTE — Assessment & Plan Note (Signed)
Encouraged DASH diet, decrease po intake and increase exercise as tolerated. Needs 7-8 hours of sleep nightly. Avoid trans fats, eat small, frequent meals every 4-5 hours with lean proteins, complex carbs and healthy fats. Minimize simple carbs, bariatric referral 

## 2016-05-15 NOTE — Assessment & Plan Note (Signed)
Good response to Singulair prn

## 2016-05-15 NOTE — Assessment & Plan Note (Signed)
Encouraged heart healthy diet, increase exercise, avoid trans fats, consider a krill oil cap daily 

## 2016-05-15 NOTE — Assessment & Plan Note (Signed)
Minimize sodium, elevate feet, compression hose and consider diuretics prn

## 2016-05-20 ENCOUNTER — Other Ambulatory Visit: Payer: Self-pay | Admitting: Family Medicine

## 2016-05-23 ENCOUNTER — Other Ambulatory Visit: Payer: Self-pay | Admitting: Family Medicine

## 2016-06-01 ENCOUNTER — Encounter: Payer: Self-pay | Admitting: Medical

## 2016-06-01 ENCOUNTER — Ambulatory Visit: Payer: 59 | Admitting: Family Medicine

## 2016-06-01 ENCOUNTER — Ambulatory Visit (HOSPITAL_BASED_OUTPATIENT_CLINIC_OR_DEPARTMENT_OTHER)
Admission: RE | Admit: 2016-06-01 | Discharge: 2016-06-01 | Disposition: A | Discharge status: Home / Self Care | Payer: 59 | Source: Ambulatory Visit | Attending: Medical | Admitting: Medical

## 2016-06-01 ENCOUNTER — Ambulatory Visit (INDEPENDENT_AMBULATORY_CARE_PROVIDER_SITE_OTHER): Payer: 59 | Admitting: Medical

## 2016-06-01 VITALS — BP 139/79 | HR 96 | Temp 97.8°F | Ht 68.0 in | Wt 244.8 lb

## 2016-06-01 DIAGNOSIS — R059 Cough, unspecified: Secondary | ICD-10-CM

## 2016-06-01 DIAGNOSIS — R05 Cough: Secondary | ICD-10-CM | POA: Insufficient documentation

## 2016-06-01 DIAGNOSIS — J40 Bronchitis, not specified as acute or chronic: Secondary | ICD-10-CM | POA: Diagnosis not present

## 2016-06-01 DIAGNOSIS — J0100 Acute maxillary sinusitis, unspecified: Secondary | ICD-10-CM | POA: Diagnosis not present

## 2016-06-01 MED ORDER — ALBUTEROL SULFATE HFA 108 (90 BASE) MCG/ACT IN AERS: 2.0000 | INHALATION_SPRAY | Freq: Four times a day (QID) | RESPIRATORY_TRACT | 2 refills | Status: DC | PRN

## 2016-06-01 MED ORDER — AMOXICILLIN-POT CLAVULANATE 875-125 MG PO TABS: 1.0000 | ORAL_TABLET | Freq: Two times a day (BID) | ORAL | 0 refills | Status: DC

## 2016-06-01 MED ORDER — HYDROCOD POLST-CPM POLST ER 10-8 MG/5ML PO SUER: 5.0000 mL | Freq: Two times a day (BID) | ORAL | 0 refills | Status: DC | PRN

## 2016-06-01 MED ORDER — FLUTICASONE PROPIONATE 50 MCG/ACT NA SUSP: 2.0000 | Freq: Every day | NASAL | 1 refills | Status: DC

## 2016-06-01 NOTE — Patient Instructions (Addendum)
You appear to have bronchitis and sinusitis(lt om as well). Rest hydrate and tylenol for fever. I am prescribing cough medicine tussionex, and augmentin  antibiotic. For your nasal congestion rx flonase  Making albuterol inhaler available if needed for wheezing  With upcoming weekend and productive cough do recommend cxr to see if any pneumonia(if found would add azithromycin)   Follow up in 7-10 days or as needed

## 2016-06-01 NOTE — Progress Notes (Signed)
Patient in with complaints of coughing .

## 2016-06-01 NOTE — Progress Notes (Signed)
Subjective:    Patient ID: Kathleen Church, female    DOB: 02/20/1960, 56 y.o.   MRN: [id number]  HPI   Pt in for recent nasal congestion, ear pain, sinus pressure and cough. She states cough is productive and feels chest congested. Pt states symptoms for 2.5 weeks. She states never resolved. Pt is about to go to out of town.  No fever,no chills and no sweats.  Some occasional spring allergies and is on singulair.   Pt states no obvious wheezing. Has old inhaler.   Review of Systems  Constitutional: Negative for chills, fatigue and fever.  HENT: Positive for congestion, ear pain, rhinorrhea, sinus pain and sinus pressure.   Respiratory: Positive for cough. Negative for choking, shortness of breath and wheezing.   Cardiovascular: Negative for chest pain and palpitations.  Gastrointestinal: Negative for abdominal pain.  Genitourinary: Negative for dysuria.  Musculoskeletal: Negative for back pain.  Skin: Negative for rash.  Neurological: Negative for dizziness, speech difficulty, weakness, numbness and headaches.  Hematological: Negative for adenopathy. Does not bruise/bleed easily.  Psychiatric/Behavioral: Negative for behavioral problems and confusion.   Past Medical History:  Diagnosis Date  . Allergic state   . Anxiety    when father passed  . Arthritis   . Asthma, mild persistent    allergy induced asthma   . Cervical cancer screening 04/27/2011  . Edema 04/27/2011  . Heart murmur    as an infant   . Hyperlipidemia 04/27/2011  . Hypertension 2010  . Hypokalemia 04/27/2011  . Measles as a child  . Mumps as a child  . Muscle cramp 10/14/2015  . Obesity   . Osteoarthritis of left knee 10/14/2015  . Plantar fasciitis, left 01/19/2013  . Preventative health care 02/13/2011  . Sinusitis 02/13/2011     Social History   Social History  . Marital status: Widowed    Spouse name: N/A  . Number of children: N/A  . Years of education: N/A   Occupational History  . Not on  file.   Social History Main Topics  . Smoking status: Never Smoker  . Smokeless tobacco: Never Used  . Alcohol use Yes     Comment: occas   . Drug use: No  . Sexual activity: Yes    Partners: Male   Other Topics Concern  . Not on file   Social History Narrative  . No narrative on file    Past Surgical History:  Procedure Laterality Date  . ENDOMETRIAL ABLATION  2006   menorraghia  . KNEE SURGERY  2006   left knee  . TONSILLECTOMY    . 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 TUBAL LIGATION      Family History  Problem Relation Age of Onset  . Cancer Father        kidney  . Cancer Maternal Grandmother        bone  . Cancer Paternal Grandmother        breast  . Asthma Daughter   . Heart attack Paternal Grandfather   . Asthma Daughter     Allergies  Allergen Reactions  . Codeine Palpitations and Rash    Current Outpatient Prescriptions on File Prior to Visit  Medication Sig Dispense Refill  . albuterol (VENTOLIN HFA) 108 (90 Base) MCG/ACT inhaler Inhale 2 puffs into the lungs every 6 (six) hours as  needed for wheezing or shortness of breath. 1 Inhaler 2  . aspirin EC 325 MG tablet Take 1 tablet (325 mg total) by mouth 2 (two) times daily. 60 tablet 0  . budesonide (PULMICORT) 180 MCG/ACT inhaler Inhale 2 puffs into the lungs 2 (two) times daily. 3 Inhaler 11  . fenofibrate (TRICOR) 145 MG tablet TAKE 1 TABLET BY MOUTH EVERY DAY 90 tablet 3  . levocetirizine (XYZAL) 5 MG tablet Take 1 tablet (5 mg total) by mouth every evening. 30 tablet 5  . metoprolol succinate (TOPROL-XL) 25 MG 24 hr tablet TAKE 1 TABLET BY MOUTH DAILY 90 tablet 1  . montelukast (SINGULAIR) 10 MG tablet TAKE 1 TABLET BY MOUTH AT BEDTIME. 90 tablet 1  . valsartan-hydrochlorothiazide (DIOVAN-HCT) 160-12.5 MG tablet TAKE 1 TABLET BY MOUTH DAILY. 90 tablet 1   No  current facility-administered medications on file prior to visit.     BP 139/79   Pulse 96   Temp 97.8 F (36.6 C) (Oral)   Ht 5\' 8"  (1.727 m)   Wt 244 lb 12.8 oz (111 kg)   SpO2 98%   BMI 37.22 kg/m       Objective:   Physical Exam   General  Mental Status - Alert. General Appearance - Well groomed. Not in acute distress.  Skin Rashes- No Rashes.  HEENT Head- Normal. Ear Auditory Canal - Left- Normal. Right - Normal.Tympanic Membrane- Left- mild red.. Right- Normal. Eye Sclera/Conjunctiva- Left- Normal. Right- Normal. Nose & Sinuses Nasal Mucosa- Left-  Boggy and Congested. Right-  Boggy and  Congested.Bilateral maxillary and frontal sinus pressure. Mouth & Throat Lips: Upper Lip- Normal: no dryness, cracking, pallor, cyanosis, or vesicular eruption. Lower Lip-Normal: no dryness, cracking, pallor, cyanosis or vesicular eruption. Buccal Mucosa- Bilateral- No Aphthous ulcers. Oropharynx- No Discharge or Erythema. +pnd Tonsils: Characteristics- Bilateral- No Erythema or Congestion. Size/Enlargement- Bilateral- No enlargement. Discharge- bilateral-None.  Neck Neck- Supple. No Masses.   Chest and Lung Exam Auscultation: Breath Sounds:-Clear even and unlabored.  Cardiovascular Auscultation:Rythm- Regular, rate and rhythm. Murmurs & Other Heart Sounds:Ausculatation of the heart reveal- No Murmurs.  Lymphatic Head & Neck General Head & Neck Lymphatics: Bilateral: Description- No Localized lymphadenopathy.       Assessment & Plan:   You appear to have bronchitis and sinusitis(lt om as well). Rest hydrate and tylenol for fever. I am prescribing cough medicine tussionex, and augmentin  antibiotic. For your nasal congestion rx flonase  Making albuterol inhaler available if needed for wheezing  With upcoming weekend and productive cough do recommend cxr to see if any pneumonia(if found would add azithromycin)   Follow up in 7-10 days or as needed  Pt has used  tussionex in past no reaction per pt report.  [name], Ramon DredgeEdward, PA-C

## 2016-08-02 DIAGNOSIS — M1711 Unilateral primary osteoarthritis, right knee: Secondary | ICD-10-CM | POA: Diagnosis not present

## 2016-08-22 ENCOUNTER — Other Ambulatory Visit: Payer: Self-pay | Admitting: Family Medicine

## 2016-10-12 ENCOUNTER — Other Ambulatory Visit: Payer: Self-pay | Admitting: Family Medicine

## 2016-10-17 ENCOUNTER — Telehealth: Payer: Self-pay | Admitting: *Deleted

## 2016-10-17 NOTE — Telephone Encounter (Signed)
Received Cologuard Order Cancellation: [id number]; order has been Cancelled because it has been Inactive, and has exceeded the 365 days from the Initial order; forwarded to provider/SLS 10/09

## 2016-11-07 ENCOUNTER — Other Ambulatory Visit: Payer: Self-pay | Admitting: Family Medicine

## 2016-11-07 DIAGNOSIS — Z1231 Encounter for screening mammogram for malignant neoplasm of breast: Secondary | ICD-10-CM

## 2016-11-10 ENCOUNTER — Ambulatory Visit
Admission: RE | Admit: 2016-11-10 | Discharge: 2016-11-10 | Disposition: A | Discharge status: Home / Self Care | Payer: 59 | Source: Ambulatory Visit | Attending: Family Medicine | Admitting: Family Medicine

## 2016-11-10 DIAGNOSIS — Z1231 Encounter for screening mammogram for malignant neoplasm of breast: Secondary | ICD-10-CM

## 2016-11-11 ENCOUNTER — Other Ambulatory Visit: Payer: Self-pay | Admitting: Family Medicine

## 2016-11-16 ENCOUNTER — Ambulatory Visit (INDEPENDENT_AMBULATORY_CARE_PROVIDER_SITE_OTHER): Payer: 59 | Admitting: Family Medicine

## 2016-11-16 ENCOUNTER — Encounter: Payer: Self-pay | Admitting: Family Medicine

## 2016-11-16 VITALS — BP 132/79 | HR 97 | Temp 98.2°F | Resp 18 | Wt 250.2 lb

## 2016-11-16 DIAGNOSIS — Z96659 Presence of unspecified artificial knee joint: Secondary | ICD-10-CM

## 2016-11-16 DIAGNOSIS — E785 Hyperlipidemia, unspecified: Secondary | ICD-10-CM | POA: Diagnosis not present

## 2016-11-16 DIAGNOSIS — Z0000 Encounter for general adult medical examination without abnormal findings: Secondary | ICD-10-CM | POA: Diagnosis not present

## 2016-11-16 DIAGNOSIS — E6609 Other obesity due to excess calories: Secondary | ICD-10-CM | POA: Diagnosis not present

## 2016-11-16 DIAGNOSIS — M791 Myalgia, unspecified site: Secondary | ICD-10-CM | POA: Diagnosis not present

## 2016-11-16 DIAGNOSIS — I10 Essential (primary) hypertension: Secondary | ICD-10-CM | POA: Diagnosis not present

## 2016-11-16 LAB — MAGNESIUM: MAGNESIUM: 2.1 mg/dL (ref 1.5–2.5)

## 2016-11-16 LAB — COMPREHENSIVE METABOLIC PANEL
ALBUMIN: 4.1 g/dL (ref 3.5–5.2)
ALT: 27 U/L (ref 0–35)
AST: 20 U/L (ref 0–37)
Alkaline Phosphatase: 63 U/L (ref 39–117)
BUN: 15 mg/dL (ref 6–23)
CALCIUM: 10 mg/dL (ref 8.4–10.5)
CO2: 29 mEq/L (ref 19–32)
Chloride: 105 mEq/L (ref 96–112)
Creatinine, Ser: 0.68 mg/dL (ref 0.40–1.20)
GFR: 94.59 mL/min (ref >60.00)
Glucose, Bld: 90 mg/dL (ref 70–99)
POTASSIUM: 3.9 meq/L (ref 3.5–5.1)
Sodium: 141 mEq/L (ref 135–145)
Total Bilirubin: 0.6 mg/dL (ref 0.2–1.2)
Total Protein: 7.8 g/dL (ref 6.0–8.3)

## 2016-11-16 LAB — CBC
HEMATOCRIT: 42.1 % (ref 36.0–46.0)
Hemoglobin: 14 g/dL (ref 12.0–15.0)
MCHC: 33.3 g/dL (ref 30.0–36.0)
MCV: 92.9 fl (ref 78.0–100.0)
PLATELETS: 423 10*3/uL — AB (ref 150.0–400.0)
RBC: 4.53 Mil/uL (ref 3.87–5.11)
RDW: 12.9 % (ref 11.5–15.5)
WBC: 7.5 10*3/uL (ref 4.0–10.5)

## 2016-11-16 LAB — LIPID PANEL
CHOLESTEROL: 171 mg/dL (ref 0–200)
HDL: 42.2 mg/dL (ref >39.00)
LDL CALC: 101 mg/dL — AB (ref 0–99)
NonHDL: 128.85
TRIGLYCERIDES: 139 mg/dL (ref 0.0–149.0)
Total CHOL/HDL Ratio: 4
VLDL: 27.8 mg/dL (ref 0.0–40.0)

## 2016-11-16 LAB — TSH: TSH: 1.27 u[IU]/mL (ref 0.35–4.50)

## 2016-11-16 NOTE — Patient Instructions (Addendum)
Hyland's leg cramp med at [facility]  Hydrate with 64 oz + of fluids without caffeine daily  Lidocaine gel for muscle cramps New shingles shot is called Shingrix 2 shots over 6 months, call insurance and confirm payment  Fax [phone] for flu shot Preventive Care 40-64 Years, Female Preventive care refers to lifestyle choices and visits with your health care provider that can promote health and wellness. What does preventive care include?  A yearly physical exam. This is also called an annual well check.  Dental exams once or twice a year.  Routine eye exams. Ask your health care provider how often you should have your eyes checked.  Personal lifestyle choices, including: ? Daily care of your teeth and gums. ? Regular physical activity. ? Eating a healthy diet. ? Avoiding tobacco and drug use. ? Limiting alcohol use. ? Practicing safe sex. ? Taking low-dose aspirin daily starting at age 46. ? Taking vitamin and mineral supplements as recommended by your health care provider. What happens during an annual well check? The services and screenings done by your health care provider during your annual well check will depend on your age, overall health, lifestyle risk factors, and family history of disease. Counseling Your health care provider may ask you questions about your:  Alcohol use.  Tobacco use.  Drug use.  Emotional well-being.  Home and relationship well-being.  Sexual activity.  Eating habits.  Work and work Statistician.  Method of birth control.  Menstrual cycle.  Pregnancy history.  Screening You may have the following tests or measurements:  Height, weight, and BMI.  Blood pressure.  Lipid and cholesterol levels. These may be checked every 5 years, or more frequently if you are over 66 years old.  Skin check.  Lung cancer screening. You may have this screening every year starting at age 25 if you have a 30-pack-year history of smoking and  currently smoke or have quit within the past 15 years.  Fecal occult blood test (FOBT) of the stool. You may have this test every year starting at age 87.  Flexible sigmoidoscopy or colonoscopy. You may have a sigmoidoscopy every 5 years or a colonoscopy every 10 years starting at age 59.  Hepatitis C blood test.  Hepatitis B blood test.  Sexually transmitted disease (STD) testing.  Diabetes screening. This is done by checking your blood sugar (glucose) after you have not eaten for a while (fasting). You may have this done every 1-3 years.  Mammogram. This may be done every 1-2 years. Talk to your health care provider about when you should start having regular mammograms. This may depend on whether you have a family history of breast cancer.  BRCA-related cancer screening. This may be done if you have a family history of breast, ovarian, tubal, or peritoneal cancers.  Pelvic exam and Pap test. This may be done every 3 years starting at age 14. Starting at age 77, this may be done every 5 years if you have a Pap test in combination with an HPV test.  Bone density scan. This is done to screen for osteoporosis. You may have this scan if you are at high risk for osteoporosis.  Discuss your test results, treatment options, and if necessary, the need for more tests with your health care provider. Vaccines Your health care provider may recommend certain vaccines, such as:  Influenza vaccine. This is recommended every year.  Tetanus, diphtheria, and acellular pertussis (Tdap, Td) vaccine. You may need a Td booster  every 10 years.  Varicella vaccine. You may need this if you have not been vaccinated.  Zoster vaccine. You may need this after age 46.  Measles, mumps, and rubella (MMR) vaccine. You may need at least one dose of MMR if you were born in 1957 or later. You may also need a second dose.  Pneumococcal 13-valent conjugate (PCV13) vaccine. You may need this if you have certain  conditions and were not previously vaccinated.  Pneumococcal polysaccharide (PPSV23) vaccine. You may need one or two doses if you smoke cigarettes or if you have certain conditions.  Meningococcal vaccine. You may need this if you have certain conditions.  Hepatitis A vaccine. You may need this if you have certain conditions or if you travel or work in places where you may be exposed to hepatitis A.  Hepatitis B vaccine. You may need this if you have certain conditions or if you travel or work in places where you may be exposed to hepatitis B.  Haemophilus influenzae type b (Hib) vaccine. You may need this if you have certain conditions.  Talk to your health care provider about which screenings and vaccines you need and how often you need them. This information is not intended to replace advice given to you by your health care provider. Make sure you discuss any questions you have with your health care provider. Document Released: 01/22/2015 Document Revised: 09/15/2015 Document Reviewed: 10/27/2014 Elsevier Interactive Patient Education  2017 [employer].

## 2016-11-16 NOTE — Assessment & Plan Note (Signed)
L TKR went well hoping to have right knee done in Feb or March with Dr Thomasena Edisollins

## 2016-11-16 NOTE — Assessment & Plan Note (Signed)
Encouraged DASH diet, decrease po intake and increase exercise as tolerated. Needs 7-8 hours of sleep nightly. Avoid trans fats, eat small, frequent meals every 4-5 hours with lean proteins, complex carbs and healthy fats. Minimize simple carbs, bariatric referral 

## 2016-11-16 NOTE — Assessment & Plan Note (Signed)
Encouraged heart healthy diet, increase exercise, avoid trans fats, consider a krill oil cap daily 

## 2016-11-16 NOTE — Progress Notes (Signed)
Subjective:  I acted as a Neurosurgeonscribe for Kathleen IncDr.Blyth. [name], RMA   Patient ID: Kathleen LericheKimberly K Church, female    DOB: 11/01/1960, 56 y.o.   MRN: [id number]  Chief Complaint  Patient presents with  . Annual Exam    HPI  Patient is in today for annual exam and follow up on chronic medical concerns. She feels well today. No recent febrile illness or hospitalizations. She notes some infrequent pain in RLQ pain, it is sharp and fleeting. No change in bowel habits or urinary concerns. Denies CP/palp/SOB/HA/congestion/fevers/GI or GU c/o. Taking meds as prescribed. Is not exercising regularly but tries to maintain a heart healthy diet.   Patient Care Team: Bradd CanaryBlyth, Stacey A, MD as PCP - General (Family Medicine)   Past Medical History:  Diagnosis Date  . Allergic state   . Anxiety    when father passed  . Arthritis   . Asthma, mild persistent    allergy induced asthma   . Cervical cancer screening 04/27/2011  . Edema 04/27/2011  . Heart murmur    as an infant   . Hyperlipidemia 04/27/2011  . Hypertension 2010  . Hypokalemia 04/27/2011  . Measles as a child  . Mumps as a child  . Muscle cramp 10/14/2015  . Obesity   . Osteoarthritis of left knee 10/14/2015  . Plantar fasciitis, left 01/19/2013  . Preventative health care 02/13/2011  . Sinusitis 02/13/2011    Past Surgical History:  Procedure Laterality Date  . ENDOMETRIAL ABLATION  2006   menorraghia  . KNEE SURGERY  2006   left knee  . TONSILLECTOMY    . TUBAL LIGATION      Family History  Problem Relation Age of Onset  . Cancer Father        kidney  . Cancer Maternal Grandmother        bone  . Cancer Paternal Grandmother        breast  . Breast cancer Paternal Grandmother   . Asthma Daughter   . Heart attack Paternal Grandfather   . Asthma Daughter     Social History   Socioeconomic History  . Marital status: Widowed    Spouse name: Not on file  . Number of children: Not on file  . Years of education: Not on file  .  Highest education level: Not on file  Social Needs  . Financial resource strain: Not on file  . Food insecurity - worry: Not on file  . Food insecurity - inability: Not on file  . Transportation needs - medical: Not on file  . Transportation needs - non-medical: Not on file  Occupational History  . Not on file  Tobacco Use  . Smoking status: Never Smoker  . Smokeless tobacco: Never Used  Substance and Sexual Activity  . Alcohol use: Yes    Comment: occas   . Drug use: No  . Sexual activity: Yes    Partners: Male  Other Topics Concern  . Not on file  Social History Narrative  . Not on file    Outpatient Medications Prior to Visit  Medication Sig Dispense Refill  . aspirin EC 325 MG tablet Take 1 tablet (325 mg total) by mouth 2 (two) times daily. 60 tablet 0  . fenofibrate (TRICOR) 145 MG tablet TAKE 1 TABLET BY MOUTH EVERY DAY 90 tablet 3  . metoprolol succinate (TOPROL-XL) 25 MG 24 hr tablet TAKE 1 TABLET BY MOUTH DAILY 90 tablet 1  . montelukast (SINGULAIR) 10 MG tablet  TAKE 1 TABLET BY MOUTH AT BEDTIME. 90 tablet 1  . valsartan-hydrochlorothiazide (DIOVAN-HCT) 160-12.5 MG tablet TAKE 1 TABLET BY MOUTH DAILY. 90 tablet 1  . albuterol (VENTOLIN HFA) 108 (90 Base) MCG/ACT inhaler Inhale 2 puffs into the lungs every 6 (six) hours as needed for wheezing or shortness of breath. 1 Inhaler 2  . amoxicillin-clavulanate (AUGMENTIN) 875-125 MG tablet Take 1 tablet by mouth 2 (two) times daily. 20 tablet 0  . budesonide (PULMICORT) 180 MCG/ACT inhaler Inhale 2 puffs into the lungs 2 (two) times daily. 3 Inhaler 11  . chlorpheniramine-HYDROcodone (TUSSIONEX PENNKINETIC ER) 10-8 MG/5ML SUER Take 5 mLs by mouth every 12 (twelve) hours as needed for cough. Can give generic 140 mL 0  . fluticasone (FLONASE) 50 MCG/ACT nasal spray Place 2 sprays into both nostrils daily. 16 g 1  . levocetirizine (XYZAL) 5 MG tablet Take 1 tablet (5 mg total) by mouth every evening. 30 tablet 5   No  facility-administered medications prior to visit.     Allergies  Allergen Reactions  . Codeine Palpitations and Rash    Review of Systems  Constitutional: Negative for fever and malaise/fatigue.  HENT: Negative for congestion.   Eyes: Negative for blurred vision.  Respiratory: Negative for shortness of breath.   Cardiovascular: Negative for chest pain, palpitations and leg swelling.  Gastrointestinal: Positive for abdominal pain. Negative for blood in stool and nausea.  Genitourinary: Negative for dysuria and frequency.  Musculoskeletal: Negative for falls.  Skin: Negative for rash.  Neurological: Negative for dizziness, loss of consciousness and headaches.  Endo/Heme/Allergies: Negative for environmental allergies.  Psychiatric/Behavioral: Negative for depression. The patient is not nervous/anxious.        Objective:    Physical Exam  Constitutional: She is oriented to person, place, and time. She appears well-developed and well-nourished. No distress.  HENT:  Head: Normocephalic and atraumatic.  Nose: Nose normal.  Eyes: Right eye exhibits no discharge. Left eye exhibits no discharge.  Neck: Normal range of motion. Neck supple.  Cardiovascular: Normal rate and regular rhythm.  No murmur heard. Pulmonary/Chest: Effort normal and breath sounds normal.  Abdominal: Soft. Bowel sounds are normal. There is no tenderness.  Musculoskeletal: She exhibits no edema.  Neurological: She is alert and oriented to person, place, and time.  Skin: Skin is warm and dry.  Psychiatric: She has a normal mood and affect.  Nursing note and vitals reviewed.   BP 132/79 (BP Location: Left Arm, Patient Position: Sitting, Cuff Size: Large)   Pulse 97   Temp 98.2 F (36.8 C) (Oral)   Resp 18   Wt 250 lb 3.2 oz (113.5 kg)   SpO2 98%   BMI 38.04 kg/m  Wt Readings from Last 3 Encounters:  11/16/16 250 lb 3.2 oz (113.5 kg)  06/01/16 244 lb 12.8 oz (111 kg)  01/11/16 238 lb (108 kg)   BP  Readings from Last 3 Encounters:  11/16/16 132/79  06/01/16 139/79  01/11/16 (!) 142/80     Immunization History  Administered Date(s) Administered  . Influenza Whole 11/10/2010, 10/09/2012  . Pneumococcal Conjugate-13 01/17/2013  . Tdap 02/08/2011    Health Maintenance  Topic Date Due  . COLONOSCOPY  04/17/2009  . INFLUENZA VACCINE  08/09/2016  . PAP SMEAR  10/11/2017  . MAMMOGRAM  11/11/2018  . TETANUS/TDAP  02/07/2021  . Hepatitis C Screening  Completed  . HIV Screening  Completed    Lab Results  Component Value Date   WBC 7.5 11/16/2016  HGB 14.0 11/16/2016   HCT 42.1 11/16/2016   PLT 423.0 (H) 11/16/2016   GLUCOSE 90 11/16/2016   CHOL 171 11/16/2016   TRIG 139.0 11/16/2016   HDL 42.20 11/16/2016   LDLDIRECT 133.4 02/08/2011   LDLCALC 101 (H) 11/16/2016   ALT 27 11/16/2016   AST 20 11/16/2016   NA 141 11/16/2016   K 3.9 11/16/2016   CL 105 11/16/2016   CREATININE 0.68 11/16/2016   BUN 15 11/16/2016   CO2 29 11/16/2016   TSH 1.27 11/16/2016   INR 1.03 10/08/2015   HGBA1C 4.8 05/12/2016    Lab Results  Component Value Date   TSH 1.27 11/16/2016   Lab Results  Component Value Date   WBC 7.5 11/16/2016   HGB 14.0 11/16/2016   HCT 42.1 11/16/2016   MCV 92.9 11/16/2016   PLT 423.0 (H) 11/16/2016   Lab Results  Component Value Date   NA 141 11/16/2016   K 3.9 11/16/2016   CO2 29 11/16/2016   GLUCOSE 90 11/16/2016   BUN 15 11/16/2016   CREATININE 0.68 11/16/2016   BILITOT 0.6 11/16/2016   ALKPHOS 63 11/16/2016   AST 20 11/16/2016   ALT 27 11/16/2016   PROT 7.8 11/16/2016   ALBUMIN 4.1 11/16/2016   CALCIUM 10.0 11/16/2016   ANIONGAP 7 10/16/2015   GFR 94.59 11/16/2016   Lab Results  Component Value Date   CHOL 171 11/16/2016   Lab Results  Component Value Date   HDL 42.20 11/16/2016   Lab Results  Component Value Date   LDLCALC 101 (H) 11/16/2016   Lab Results  Component Value Date   TRIG 139.0 11/16/2016   Lab Results    Component Value Date   CHOLHDL 4 11/16/2016   Lab Results  Component Value Date   HGBA1C 4.8 05/12/2016         Assessment & Plan:   Problem List Items Addressed This Visit    Hypertension    Well controlled, no changes to meds. Encouraged heart healthy diet such as the DASH diet and exercise as tolerated.       Relevant Orders   CBC (Completed)   Comprehensive metabolic panel (Completed)   TSH (Completed)   Obesity    Encouraged DASH diet, decrease po intake and increase exercise as tolerated. Needs 7-8 hours of sleep nightly. Avoid trans fats, eat small, frequent meals every 4-5 hours with lean proteins, complex carbs and healthy fats. Minimize simple carbs, bariatric referral      Preventative health care    Patient encouraged to maintain heart healthy diet, regular exercise, adequate sleep. Consider daily probiotics. Take medications as prescribed.      Hyperlipidemia    Encouraged heart healthy diet, increase exercise, avoid trans fats, consider a krill oil cap daily      Relevant Orders   Lipid panel (Completed)   S/P knee replacement    L TKR went well hoping to have right knee done in Feb or March with Dr Thomasena Edis      Myalgia    Increase hydration stop caffeine, Hyland's leg cramp med      Relevant Orders   Magnesium (Completed)      I am having Kathleen Church maintain her budesonide, aspirin EC, levocetirizine, chlorpheniramine-HYDROcodone, amoxicillin-clavulanate, fluticasone, albuterol, metoprolol succinate, fenofibrate, and montelukast.  No orders of the defined types were placed in this encounter.   CMA served as Neurosurgeon during this visit. History, Physical and Plan performed by medical provider. Documentation and  orders reviewed and attested to.  Penni Homans, MD

## 2016-11-16 NOTE — Assessment & Plan Note (Signed)
Increase hydration stop caffeine, Hyland's leg cramp med

## 2016-11-16 NOTE — Assessment & Plan Note (Signed)
Patient encouraged to maintain heart healthy diet, regular exercise, adequate sleep. Consider daily probiotics. Take medications as prescribed 

## 2016-11-16 NOTE — Assessment & Plan Note (Signed)
Well controlled, no changes to meds. Encouraged heart healthy diet such as the DASH diet and exercise as tolerated.  °

## 2016-11-20 ENCOUNTER — Other Ambulatory Visit: Payer: Self-pay | Admitting: Family Medicine

## 2017-01-13 DIAGNOSIS — R05 Cough: Secondary | ICD-10-CM | POA: Diagnosis not present

## 2017-01-13 DIAGNOSIS — J0140 Acute pansinusitis, unspecified: Secondary | ICD-10-CM | POA: Diagnosis not present

## 2017-01-31 DIAGNOSIS — M1711 Unilateral primary osteoarthritis, right knee: Secondary | ICD-10-CM | POA: Diagnosis not present

## 2017-02-20 ENCOUNTER — Other Ambulatory Visit: Payer: Self-pay | Admitting: Family Medicine

## 2017-03-23 DIAGNOSIS — M1711 Unilateral primary osteoarthritis, right knee: Secondary | ICD-10-CM | POA: Diagnosis not present

## 2017-04-09 DIAGNOSIS — M1711 Unilateral primary osteoarthritis, right knee: Secondary | ICD-10-CM | POA: Diagnosis not present

## 2017-05-11 ENCOUNTER — Other Ambulatory Visit: Payer: Self-pay | Admitting: Family Medicine

## 2017-05-17 ENCOUNTER — Encounter: Payer: Self-pay | Admitting: Family Medicine

## 2017-05-17 ENCOUNTER — Ambulatory Visit: Payer: 59 | Admitting: Family Medicine

## 2017-05-17 DIAGNOSIS — E6609 Other obesity due to excess calories: Secondary | ICD-10-CM | POA: Diagnosis not present

## 2017-05-17 DIAGNOSIS — E785 Hyperlipidemia, unspecified: Secondary | ICD-10-CM

## 2017-05-17 DIAGNOSIS — Z9109 Other allergy status, other than to drugs and biological substances: Secondary | ICD-10-CM

## 2017-05-17 DIAGNOSIS — F4321 Adjustment disorder with depressed mood: Secondary | ICD-10-CM

## 2017-05-17 DIAGNOSIS — I10 Essential (primary) hypertension: Secondary | ICD-10-CM

## 2017-05-17 DIAGNOSIS — R000 Tachycardia, unspecified: Secondary | ICD-10-CM

## 2017-05-17 LAB — LIPID PANEL
CHOLESTEROL: 155 mg/dL (ref 0–200)
HDL: 43.4 mg/dL (ref >39.00)
LDL Cholesterol: 87 mg/dL (ref 0–99)
NONHDL: 111.6
Total CHOL/HDL Ratio: 4
Triglycerides: 125 mg/dL (ref 0.0–149.0)
VLDL: 25 mg/dL (ref 0.0–40.0)

## 2017-05-17 LAB — CBC
HEMATOCRIT: 41.7 % (ref 36.0–46.0)
Hemoglobin: 14.1 g/dL (ref 12.0–15.0)
MCHC: 33.7 g/dL (ref 30.0–36.0)
MCV: 92.7 fl (ref 78.0–100.0)
Platelets: 424 10*3/uL — ABNORMAL HIGH (ref 150.0–400.0)
RBC: 4.49 Mil/uL (ref 3.87–5.11)
RDW: 13.5 % (ref 11.5–15.5)
WBC: 8.3 10*3/uL (ref 4.0–10.5)

## 2017-05-17 LAB — COMPREHENSIVE METABOLIC PANEL
ALBUMIN: 4.1 g/dL (ref 3.5–5.2)
ALK PHOS: 56 U/L (ref 39–117)
ALT: 32 U/L (ref 0–35)
AST: 28 U/L (ref 0–37)
BILIRUBIN TOTAL: 0.5 mg/dL (ref 0.2–1.2)
BUN: 17 mg/dL (ref 6–23)
CO2: 31 mEq/L (ref 19–32)
CREATININE: 0.69 mg/dL (ref 0.40–1.20)
Calcium: 9.5 mg/dL (ref 8.4–10.5)
Chloride: 104 mEq/L (ref 96–112)
GFR: 92.85 mL/min (ref >60.00)
GLUCOSE: 88 mg/dL (ref 70–99)
Potassium: 3.6 mEq/L (ref 3.5–5.1)
SODIUM: 142 meq/L (ref 135–145)
TOTAL PROTEIN: 7.6 g/dL (ref 6.0–8.3)

## 2017-05-17 LAB — TSH: TSH: 1.57 u[IU]/mL (ref 0.35–4.50)

## 2017-05-17 MED ORDER — ALPRAZOLAM 0.25 MG PO TABS: 0.2500 mg | ORAL_TABLET | Freq: Two times a day (BID) | ORAL | 0 refills | Status: DC | PRN

## 2017-05-17 MED ORDER — MONTELUKAST SODIUM 10 MG PO TABS: 10.0000 mg | ORAL_TABLET | Freq: Every day | ORAL | 1 refills | Status: DC

## 2017-05-17 MED ORDER — METOPROLOL SUCCINATE ER 50 MG PO TB24: 50.0000 mg | ORAL_TABLET | Freq: Every day | ORAL | 1 refills | Status: DC

## 2017-05-17 MED ORDER — VALSARTAN-HYDROCHLOROTHIAZIDE 160-12.5 MG PO TABS: 1.0000 | ORAL_TABLET | Freq: Every day | ORAL | 1 refills | Status: DC

## 2017-05-17 MED ORDER — METOPROLOL SUCCINATE ER 25 MG PO TB24: 25.0000 mg | ORAL_TABLET | Freq: Every day | ORAL | 1 refills | Status: DC

## 2017-05-17 NOTE — Assessment & Plan Note (Signed)
RRR on recheck.  

## 2017-05-17 NOTE — Progress Notes (Signed)
Subjective:  I acted as a Neurosurgeon for Dr. Abner Greenspan. [name], [place]  Patient ID: Kathleen Church, female    DOB: 12-09-1960, 57 y.o.   MRN: [id number]  No chief complaint on file.   HPI  Patient is in today for 6 month and overall is doing  well. Has had some recent  Increased allergies and congestion but is only taking singulair. No recent febrile illness or hospitalizations. Her daughter is getting married to a nice guy this fall and she is happy about that. She is sad because the birthday of her husband she lost 4 years ago. She manages well most days but does note she is anxious about the wedding in the fall. Denies CP/palp/SOB/HA/congestion/fevers/GI or GU c/o. Taking meds as prescribed  Patient Care Team: Bradd Canary, MD as PCP - General (Family Medicine)   Past Medical History:  Diagnosis Date  . Allergic state   . Anxiety    when father passed  . Arthritis   . Asthma, mild persistent    allergy induced asthma   . Cervical cancer screening 04/27/2011  . Edema 04/27/2011  . Heart murmur    as an infant   . Hyperlipidemia 04/27/2011  . Hypertension 2010  . Hypokalemia 04/27/2011  . Measles as a child  . Mumps as a child  . Muscle cramp 10/14/2015  . Obesity   . Osteoarthritis of left knee 10/14/2015  . Plantar fasciitis, left 01/19/2013  . Preventative health care 02/13/2011  . Sinusitis 02/13/2011    Past Surgical History:  Procedure Laterality Date  . ENDOMETRIAL ABLATION  2006   menorraghia  . KNEE SURGERY  2006   left knee  . TONSILLECTOMY    . 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 TUBAL LIGATION      Family History  Problem Relation Age of Onset  . Cancer Father        kidney  . Cancer Maternal Grandmother        bone  . Cancer Paternal Grandmother        breast  . Breast cancer Paternal Grandmother   . Asthma Daughter     . Heart attack Paternal Grandfather   . Asthma Daughter     Social History   Socioeconomic History  . Marital status: Widowed    Spouse name: Not on file  . Number of children: Not on file  . Years of education: Not on file  . Highest education level: Not on file  Occupational History  . Not on file  Social Needs  . Financial resource strain: Not on file  . Food insecurity:    Worry: Not on file    Inability: Not on file  . Transportation needs:    Medical: Not on file    Non-medical: Not on file  Tobacco Use  . Smoking status: Never Smoker  . Smokeless tobacco: Never Used  Substance and Sexual Activity  . Alcohol use: Yes    Comment: occas   . Drug use: No  . Sexual activity: Yes    Partners: Male  Lifestyle  . Physical activity:    Days per week: Not on file    Minutes per session: Not on file  . Stress: Not on file  Relationships  . Social connections:    Talks on phone: Not on file    Gets  together: Not on file    Attends religious service: Not on file    Active member of club or organization: Not on file    Attends meetings of clubs or organizations: Not on file    Relationship status: Not on file  . Intimate partner violence:    Fear of current or ex partner: Not on file    Emotionally abused: Not on file    Physically abused: Not on file    Forced sexual activity: Not on file  Other Topics Concern  . Not on file  Social History Narrative  . Not on file    Outpatient Medications Prior to Visit  Medication Sig Dispense Refill  . albuterol (VENTOLIN HFA) 108 (90 Base) MCG/ACT inhaler Inhale 2 puffs into the lungs every 6 (six) hours as needed for wheezing or shortness of breath. 1 Inhaler 2  . aspirin EC 325 MG tablet Take 1 tablet (325 mg total) by mouth 2 (two) times daily. 60 tablet 0  . budesonide (PULMICORT) 180 MCG/ACT inhaler Inhale 2 puffs into the lungs 2 (two) times daily. 3 Inhaler 11  . fenofibrate (TRICOR) 145 MG tablet TAKE 1 TABLET BY  MOUTH EVERY DAY 90 tablet 3  . fluticasone (FLONASE) 50 MCG/ACT nasal spray Place 2 sprays into both nostrils daily. 16 g 1  . levocetirizine (XYZAL) 5 MG tablet Take 1 tablet (5 mg total) by mouth every evening. 30 tablet 5  . amoxicillin-clavulanate (AUGMENTIN) 875-125 MG tablet Take 1 tablet by mouth 2 (two) times daily. 20 tablet 0  . chlorpheniramine-HYDROcodone (TUSSIONEX PENNKINETIC ER) 10-8 MG/5ML SUER Take 5 mLs by mouth every 12 (twelve) hours as needed for cough. Can give generic 140 mL 0  . metoprolol succinate (TOPROL-XL) 25 MG 24 hr tablet TAKE 1 TABLET BY MOUTH DAILY 90 tablet 1  . montelukast (SINGULAIR) 10 MG tablet TAKE 1 TABLET BY MOUTH AT BEDTIME. 90 tablet 1  . valsartan-hydrochlorothiazide (DIOVAN-HCT) 160-12.5 MG tablet TAKE 1 TABLET BY MOUTH DAILY. 90 tablet 1   No facility-administered medications prior to visit.     Allergies  Allergen Reactions  . Codeine Palpitations and Rash    Review of Systems  Constitutional: Negative for fever and malaise/fatigue.  HENT: Negative for congestion.   Eyes: Negative for blurred vision.  Respiratory: Negative for shortness of breath.   Cardiovascular: Negative for chest pain, palpitations and leg swelling.  Gastrointestinal: Negative for abdominal pain, blood in stool and nausea.  Genitourinary: Negative for dysuria and frequency.  Musculoskeletal: Negative for falls.  Skin: Negative for rash.  Neurological: Negative for dizziness, loss of consciousness and headaches.  Endo/Heme/Allergies: Negative for environmental allergies.  Psychiatric/Behavioral: Negative for depression. The patient is not nervous/anxious.        Objective:    Physical Exam  Constitutional: She is oriented to person, place, and time. No distress.  HENT:  Head: Normocephalic and atraumatic.  Eyes: Conjunctivae are normal.  Neck: Neck supple. No thyromegaly present.  Cardiovascular: Normal rate, regular rhythm and normal heart sounds.  No  murmur heard. Pulmonary/Chest: Effort normal and breath sounds normal. She has no wheezes.  Abdominal: She exhibits no distension and no mass.  Musculoskeletal: She exhibits no edema.  Lymphadenopathy:    She has no cervical adenopathy.  Neurological: She is alert and oriented to person, place, and time.  Skin: Skin is warm and dry. No rash noted. She is not diaphoretic.  Psychiatric: Judgment normal.    BP 140/89 (BP Location: Left Arm, Patient  Position: Sitting, Cuff Size: Normal)   Pulse 94   Temp 98.1 F (36.7 C) (Oral)   Resp 18   Wt 247 lb 6.4 oz (112.2 kg)   SpO2 95%   BMI 37.62 kg/m  Wt Readings from Last 3 Encounters:  05/17/17 247 lb 6.4 oz (112.2 kg)  11/16/16 250 lb 3.2 oz (113.5 kg)  06/01/16 244 lb 12.8 oz (111 kg)   BP Readings from Last 3 Encounters:  05/17/17 140/89  11/16/16 132/79  06/01/16 139/79     Immunization History  Administered Date(s) Administered  . Influenza Whole 11/10/2010, 10/09/2012  . Pneumococcal Conjugate-13 01/17/2013  . Tdap 02/08/2011    Health Maintenance  Topic Date Due  . COLONOSCOPY  04/17/2009  . INFLUENZA VACCINE  08/09/2017  . PAP SMEAR  10/11/2017  . MAMMOGRAM  11/11/2018  . TETANUS/TDAP  02/07/2021  . Hepatitis C Screening  Completed  . HIV Screening  Completed    Lab Results  Component Value Date   WBC 7.5 11/16/2016   HGB 14.0 11/16/2016   HCT 42.1 11/16/2016   PLT 423.0 (H) 11/16/2016   GLUCOSE 90 11/16/2016   CHOL 171 11/16/2016   TRIG 139.0 11/16/2016   HDL 42.20 11/16/2016   LDLDIRECT 133.4 02/08/2011   LDLCALC 101 (H) 11/16/2016   ALT 27 11/16/2016   AST 20 11/16/2016   NA 141 11/16/2016   K 3.9 11/16/2016   CL 105 11/16/2016   CREATININE 0.68 11/16/2016   BUN 15 11/16/2016   CO2 29 11/16/2016   TSH 1.27 11/16/2016   INR 1.03 10/08/2015   HGBA1C 4.8 05/12/2016    Lab Results  Component Value Date   TSH 1.27 11/16/2016   Lab Results  Component Value Date   WBC 7.5 11/16/2016   HGB  14.0 11/16/2016   HCT 42.1 11/16/2016   MCV 92.9 11/16/2016   PLT 423.0 (H) 11/16/2016   Lab Results  Component Value Date   NA 141 11/16/2016   K 3.9 11/16/2016   CO2 29 11/16/2016   GLUCOSE 90 11/16/2016   BUN 15 11/16/2016   CREATININE 0.68 11/16/2016   BILITOT 0.6 11/16/2016   ALKPHOS 63 11/16/2016   AST 20 11/16/2016   ALT 27 11/16/2016   PROT 7.8 11/16/2016   ALBUMIN 4.1 11/16/2016   CALCIUM 10.0 11/16/2016   ANIONGAP 7 10/16/2015   GFR 94.59 11/16/2016   Lab Results  Component Value Date   CHOL 171 11/16/2016   Lab Results  Component Value Date   HDL 42.20 11/16/2016   Lab Results  Component Value Date   LDLCALC 101 (H) 11/16/2016   Lab Results  Component Value Date   TRIG 139.0 11/16/2016   Lab Results  Component Value Date   CHOLHDL 4 11/16/2016   Lab Results  Component Value Date   HGBA1C 4.8 05/12/2016         Assessment & Plan:   Problem List Items Addressed This Visit    Hypertension    Well controlled, no changes to meds. Encouraged heart healthy diet such as the DASH diet and exercise as tolerated.       Relevant Medications   valsartan-hydrochlorothiazide (DIOVAN-HCT) 160-12.5 MG tablet   metoprolol succinate (TOPROL-XL) 25 MG 24 hr tablet   Other Relevant Orders   CBC   Comprehensive metabolic panel   TSH   Environmental allergies    Has had a mild flare. Is only taking Singulair so will add Cetirizine 10 mg daily to twice daily  Obesity    Encouraged DASH diet, decrease po intake and increase exercise as tolerated. Needs 7-8 hours of sleep nightly. Avoid trans fats, eat small, frequent meals every 4-5 hours with lean proteins, complex carbs and healthy fats. Minimize simple carbs      Hyperlipidemia    Encouraged heart healthy diet, increase exercise, avoid trans fats, consider a krill oil cap daily      Relevant Medications   valsartan-hydrochlorothiazide (DIOVAN-HCT) 160-12.5 MG tablet   metoprolol succinate  (TOPROL-XL) 25 MG 24 hr tablet   Other Relevant Orders   Lipid panel   Grief reaction    4 years since her husband died. She is doing well but her daughter's wedding is the fall so she is given #5 Alprazolam for the wedding she is advised if she needs refills we will have to do a contract and testing      Tachycardia    RRR on recheck         I have discontinued Tammi Sou. Sorbello's chlorpheniramine-HYDROcodone and amoxicillin-clavulanate. I have also changed her montelukast and metoprolol succinate. Additionally, I am having her start on ALPRAZolam. Lastly, I am having her maintain her budesonide, aspirin EC, levocetirizine, fluticasone, albuterol, fenofibrate, and valsartan-hydrochlorothiazide.  Meds ordered this encounter  Medications  . valsartan-hydrochlorothiazide (DIOVAN-HCT) 160-12.5 MG tablet    Sig: Take 1 tablet by mouth daily.    Dispense:  90 tablet    Refill:  1  . montelukast (SINGULAIR) 10 MG tablet    Sig: Take 1 tablet (10 mg total) by mouth at bedtime.    Dispense:  90 tablet    Refill:  1  . metoprolol succinate (TOPROL-XL) 25 MG 24 hr tablet    Sig: Take 1 tablet (25 mg total) by mouth daily.    Dispense:  90 tablet    Refill:  1  . ALPRAZolam (XANAX) 0.25 MG tablet    Sig: Take 1 tablet (0.25 mg total) by mouth 2 (two) times daily as needed for anxiety.    Dispense:  5 tablet    Refill:  0    CMA served as scribe during this visit. History, Physical and Plan performed by medical provider. Documentation and orders reviewed and attested to.  Danise Edge, MD

## 2017-05-17 NOTE — Assessment & Plan Note (Signed)
Encouraged DASH diet, decrease po intake and increase exercise as tolerated. Needs 7-8 hours of sleep nightly. Avoid trans fats, eat small, frequent meals every 4-5 hours with lean proteins, complex carbs and healthy fats. Minimize simple carbs 

## 2017-05-17 NOTE — Assessment & Plan Note (Signed)
4 years since her husband died. She is doing well but her daughter's wedding is the fall so she is given #5 Alprazolam for the wedding she is advised if she needs refills we will have to do a contract and testing

## 2017-05-17 NOTE — Patient Instructions (Addendum)
You can take Zyrtec/Cetirizine 10 mg up to twice daily  Shingrix is the new shingles shot, get proof  Allergies An allergy is when your body reacts to a substance in a way that is not normal. An allergic reaction can happen after you:  Eat something.  Breathe in something.  Touch something.  You can be allergic to:  Things that are only around during certain seasons, like molds and pollens.  Foods.  Drugs.  Insects.  Animal dander.  What are the signs or symptoms?  Puffiness (swelling). This may happen on the lips, face, tongue, mouth, or throat.  Sneezing.  Coughing.  Breathing loudly (wheezing).  Stuffy nose.  Tingling in the mouth.  A rash.  Itching.  Itchy, red, puffy areas of skin (hives).  Watery eyes.  Throwing up (vomiting).  Watery poop (diarrhea).  Dizziness.  Feeling faint or fainting.  Trouble breathing or swallowing.  A tight feeling in the chest.  A fast heartbeat. How is this diagnosed? Allergies can be diagnosed with:  A medical and family history.  Skin tests.  Blood tests.  A food diary. A food diary is a record of all the foods, drinks, and symptoms you have each day.  The results of an elimination diet. This diet involves making sure not to eat certain foods and then seeing what happens when you start eating them again.  How is this treated? There is no cure for allergies, but allergic reactions can be treated with medicine. Severe reactions usually need to be treated at a hospital. How is this prevented? The best way to prevent an allergic reaction is to avoid the thing you are allergic to. Allergy shots and medicines can also help prevent reactions in some cases. This information is not intended to replace advice given to you by your health care provider. Make sure you discuss any questions you have with your health care provider. Document Released: 04/22/2012 Document Revised: 08/23/2015 Document Reviewed:  10/07/2013 Elsevier Interactive Patient Education  [employer].

## 2017-05-17 NOTE — Assessment & Plan Note (Signed)
Encouraged heart healthy diet, increase exercise, avoid trans fats, consider a krill oil cap daily 

## 2017-05-17 NOTE — Assessment & Plan Note (Addendum)
Not controlled, increase Metoprolol XL 50 mg daily. Encouraged heart healthy diet such as the DASH diet and exercise as tolerated.

## 2017-05-17 NOTE — Assessment & Plan Note (Signed)
Has had a mild flare. Is only taking Singulair so will add Cetirizine 10 mg daily to twice daily

## 2017-06-20 ENCOUNTER — Ambulatory Visit: Payer: 59

## 2017-07-10 DIAGNOSIS — M1711 Unilateral primary osteoarthritis, right knee: Secondary | ICD-10-CM | POA: Diagnosis not present

## 2017-07-10 DIAGNOSIS — M25561 Pain in right knee: Secondary | ICD-10-CM | POA: Diagnosis not present

## 2017-10-25 ENCOUNTER — Other Ambulatory Visit: Payer: Self-pay | Admitting: Family Medicine

## 2017-10-30 ENCOUNTER — Other Ambulatory Visit: Payer: Self-pay | Admitting: Family Medicine

## 2017-10-30 DIAGNOSIS — Z1231 Encounter for screening mammogram for malignant neoplasm of breast: Secondary | ICD-10-CM

## 2017-11-01 DIAGNOSIS — M1711 Unilateral primary osteoarthritis, right knee: Secondary | ICD-10-CM | POA: Diagnosis not present

## 2017-11-10 ENCOUNTER — Other Ambulatory Visit: Payer: Self-pay | Admitting: Family Medicine

## 2017-11-13 ENCOUNTER — Other Ambulatory Visit: Payer: Self-pay | Admitting: Family Medicine

## 2017-11-15 DIAGNOSIS — H02831 Dermatochalasis of right upper eyelid: Secondary | ICD-10-CM | POA: Diagnosis not present

## 2017-11-15 DIAGNOSIS — H02413 Mechanical ptosis of bilateral eyelids: Secondary | ICD-10-CM | POA: Diagnosis not present

## 2017-11-15 DIAGNOSIS — H02834 Dermatochalasis of left upper eyelid: Secondary | ICD-10-CM | POA: Diagnosis not present

## 2017-11-15 DIAGNOSIS — H0279 Other degenerative disorders of eyelid and periocular area: Secondary | ICD-10-CM | POA: Diagnosis not present

## 2017-11-27 ENCOUNTER — Encounter: Payer: Self-pay | Admitting: Family Medicine

## 2017-11-27 ENCOUNTER — Ambulatory Visit (INDEPENDENT_AMBULATORY_CARE_PROVIDER_SITE_OTHER): Payer: 59 | Admitting: Family Medicine

## 2017-11-27 ENCOUNTER — Other Ambulatory Visit (HOSPITAL_COMMUNITY)
Admission: RE | Admit: 2017-11-27 | Discharge: 2017-11-27 | Disposition: A | Discharge status: Home / Self Care | Payer: 59 | Source: Ambulatory Visit | Attending: Family Medicine | Admitting: Family Medicine

## 2017-11-27 VITALS — BP 134/74 | HR 82 | Temp 98.2°F | Resp 18 | Ht 68.5 in | Wt 244.4 lb

## 2017-11-27 DIAGNOSIS — E6609 Other obesity due to excess calories: Secondary | ICD-10-CM

## 2017-11-27 DIAGNOSIS — Z0000 Encounter for general adult medical examination without abnormal findings: Secondary | ICD-10-CM | POA: Diagnosis not present

## 2017-11-27 DIAGNOSIS — E785 Hyperlipidemia, unspecified: Secondary | ICD-10-CM

## 2017-11-27 DIAGNOSIS — Z124 Encounter for screening for malignant neoplasm of cervix: Secondary | ICD-10-CM | POA: Diagnosis not present

## 2017-11-27 DIAGNOSIS — I10 Essential (primary) hypertension: Secondary | ICD-10-CM | POA: Diagnosis not present

## 2017-11-27 DIAGNOSIS — R000 Tachycardia, unspecified: Secondary | ICD-10-CM

## 2017-11-27 DIAGNOSIS — Z23 Encounter for immunization: Secondary | ICD-10-CM | POA: Diagnosis not present

## 2017-11-27 LAB — LIPID PANEL
CHOLESTEROL: 165 mg/dL (ref 0–200)
HDL: 40.7 mg/dL (ref >39.00)
LDL Cholesterol: 97 mg/dL (ref 0–99)
NonHDL: 123.9
TRIGLYCERIDES: 134 mg/dL (ref 0.0–149.0)
Total CHOL/HDL Ratio: 4
VLDL: 26.8 mg/dL (ref 0.0–40.0)

## 2017-11-27 LAB — COMPREHENSIVE METABOLIC PANEL
ALBUMIN: 4.2 g/dL (ref 3.5–5.2)
ALK PHOS: 63 U/L (ref 39–117)
ALT: 33 U/L (ref 0–35)
AST: 24 U/L (ref 0–37)
BILIRUBIN TOTAL: 0.5 mg/dL (ref 0.2–1.2)
BUN: 14 mg/dL (ref 6–23)
CALCIUM: 9.6 mg/dL (ref 8.4–10.5)
CO2: 29 mEq/L (ref 19–32)
CREATININE: 0.62 mg/dL (ref 0.40–1.20)
Chloride: 103 mEq/L (ref 96–112)
GFR: 104.86 mL/min (ref >60.00)
Glucose, Bld: 85 mg/dL (ref 70–99)
Potassium: 3.6 mEq/L (ref 3.5–5.1)
Sodium: 142 mEq/L (ref 135–145)
Total Protein: 7.1 g/dL (ref 6.0–8.3)

## 2017-11-27 LAB — CBC
HCT: 39.3 % (ref 36.0–46.0)
HEMOGLOBIN: 13.4 g/dL (ref 12.0–15.0)
MCHC: 34.1 g/dL (ref 30.0–36.0)
MCV: 92.7 fl (ref 78.0–100.0)
PLATELETS: 455 10*3/uL — AB (ref 150.0–400.0)
RBC: 4.24 Mil/uL (ref 3.87–5.11)
RDW: 13 % (ref 11.5–15.5)
WBC: 8.2 10*3/uL (ref 4.0–10.5)

## 2017-11-27 LAB — TSH: TSH: 1.8 u[IU]/mL (ref 0.35–4.50)

## 2017-11-27 NOTE — Assessment & Plan Note (Signed)
Well controlled, no changes to meds. Encouraged heart healthy diet such as the DASH diet and exercise as tolerated.  °

## 2017-11-27 NOTE — Assessment & Plan Note (Signed)
Encouraged DASH diet, decrease po intake and increase exercise as tolerated. Needs 7-8 hours of sleep nightly. Avoid trans fats, eat small, frequent meals every 4-5 hours with lean proteins, complex carbs and healthy fats. Minimize simple carbs 

## 2017-11-27 NOTE — Assessment & Plan Note (Signed)
Encouraged heart healthy diet, increase exercise, avoid trans fats, consider a krill oil cap daily 

## 2017-11-27 NOTE — Patient Instructions (Addendum)
Get [place] copy of your flu shot if possible  Preventive Care 40-64 Years, Female Preventive care refers to lifestyle choices and visits with your health care provider that can promote health and wellness. What does preventive care include?  A yearly physical exam. This is also called an annual well check.  Dental exams once or twice a year.  Routine eye exams. Ask your health care provider how often you should have your eyes checked.  Personal lifestyle choices, including: ? Daily care of your teeth and gums. ? Regular physical activity. ? Eating a healthy diet. ? Avoiding tobacco and drug use. ? Limiting alcohol use. ? Practicing safe sex. ? Taking low-dose aspirin daily starting at age 39. ? Taking vitamin and mineral supplements as recommended by your health care provider. What happens during an annual well check? The services and screenings done by your health care provider during your annual well check will depend on your age, overall health, lifestyle risk factors, and family history of disease. Counseling Your health care provider may ask you questions about your:  Alcohol use.  Tobacco use.  Drug use.  Emotional well-being.  Home and relationship well-being.  Sexual activity.  Eating habits.  Work and work Statistician.  Method of birth control.  Menstrual cycle.  Pregnancy history.  Screening You may have the following tests or measurements:  Height, weight, and BMI.  Blood pressure.  Lipid and cholesterol levels. These may be checked every 5 years, or more frequently if you are over 65 years old.  Skin check.  Lung cancer screening. You may have this screening every year starting at age 55 if you have a 30-pack-year history of smoking and currently smoke or have quit within the past 15 years.  Fecal occult blood test (FOBT) of the stool. You may have this test every year starting at age 88.  Flexible sigmoidoscopy or colonoscopy. You may have a  sigmoidoscopy every 5 years or a colonoscopy every 10 years starting at age 82.  Hepatitis C blood test.  Hepatitis B blood test.  Sexually transmitted disease (STD) testing.  Diabetes screening. This is done by checking your blood sugar (glucose) after you have not eaten for a while (fasting). You may have this done every 1-3 years.  Mammogram. This may be done every 1-2 years. Talk to your health care provider about when you should start having regular mammograms. This may depend on whether you have a family history of breast cancer.  BRCA-related cancer screening. This may be done if you have a family history of breast, ovarian, tubal, or peritoneal cancers.  Pelvic exam and Pap test. This may be done every 3 years starting at age 15. Starting at age 30, this may be done every 5 years if you have a Pap test in combination with an HPV test.  Bone density scan. This is done to screen for osteoporosis. You may have this scan if you are at high risk for osteoporosis.  Discuss your test results, treatment options, and if necessary, the need for more tests with your health care provider. Vaccines Your health care provider may recommend certain vaccines, such as:  Influenza vaccine. This is recommended every year.  Tetanus, diphtheria, and acellular pertussis (Tdap, Td) vaccine. You may need a Td booster every 10 years.  Varicella vaccine. You may need this if you have not been vaccinated.  Zoster vaccine. You may need this after age 8.  Measles, mumps, and rubella (MMR) vaccine. You may need at  least one dose of MMR if you were born in 1957 or later. You may also need a second dose.  Pneumococcal 13-valent conjugate (PCV13) vaccine. You may need this if you have certain conditions and were not previously vaccinated.  Pneumococcal polysaccharide (PPSV23) vaccine. You may need one or two doses if you smoke cigarettes or if you have certain conditions.  Meningococcal vaccine. You may  need this if you have certain conditions.  Hepatitis A vaccine. You may need this if you have certain conditions or if you travel or work in places where you may be exposed to hepatitis A.  Hepatitis B vaccine. You may need this if you have certain conditions or if you travel or work in places where you may be exposed to hepatitis B.  Haemophilus influenzae type b (Hib) vaccine. You may need this if you have certain conditions.  Talk to your health care provider about which screenings and vaccines you need and how often you need them. This information is not intended to replace advice given to you by your health care provider. Make sure you discuss any questions you have with your health care provider. Document Released: 01/22/2015 Document Revised: 09/15/2015 Document Reviewed: 10/27/2014 Elsevier Interactive Patient Education  [employer].

## 2017-11-27 NOTE — Assessment & Plan Note (Signed)
Patient encouraged to maintain heart healthy diet, regular exercise, adequate sleep. Consider daily probiotics. Take medications as prescribed. She had her flu shot at work in October will try and get record. Shingrix number one today

## 2017-11-27 NOTE — Assessment & Plan Note (Signed)
RRR today 

## 2017-11-27 NOTE — Progress Notes (Addendum)
Subjective:    Patient ID: Kathleen LericheKimberly K Ferger, female    DOB: 04/21/1960, 57 y.o.   MRN: [id number]  Chief Complaint  Patient presents with  . Annual Exam    HPI Patient is in today for preventative exam. She feels well. No recent febrile illness or hospitalizations. Her daughter just got married was fun but exhausting. No acute concerns. Denies CP/palp/SOB/HA/congestion/fevers/GI or GU c/o. Taking meds as prescribed. Is doing well with activities of daily living and tries to maintain a heart healthy diet. No gyn complaints has a MGM scheduled for next month.   Past Medical History:  Diagnosis Date  . Allergic state   . Anxiety    when father passed  . Arthritis   . Asthma, mild persistent    allergy induced asthma   . Cervical cancer screening 04/27/2011  . Edema 04/27/2011  . Heart murmur    as an infant   . Hyperlipidemia 04/27/2011  . Hypertension 2010  . Hypokalemia 04/27/2011  . Measles as a child  . Mumps as a child  . Muscle cramp 10/14/2015  . Obesity   . Osteoarthritis of left knee 10/14/2015  . Plantar fasciitis, left 01/19/2013  . Preventative health care 02/13/2011  . Sinusitis 02/13/2011    Past Surgical History:  Procedure Laterality Date  . ENDOMETRIAL ABLATION  2006   menorraghia  . KNEE SURGERY  2006   left knee  . TONSILLECTOMY    . TOTAL KNEE ARTHROPLASTY Left 10/15/2015   Procedure: LEFT TOTAL KNEE ARTHROPLASTY;  Surgeon: Eugenia Mcalpineobert Collins, MD;  Location: [facility];  Service: Orthopedics;  Laterality: Left;  . TUBAL LIGATION      Family History  Problem Relation Age of Onset  . Cancer Father        kidney  . Cancer Maternal Grandmother        bone  . Cancer Paternal Grandmother        breast  . Breast cancer Paternal Grandmother   . Asthma Daughter   . Heart attack Paternal Grandfather   . Asthma Daughter     Social History   Socioeconomic History  . Marital status: Widowed    Spouse name: Not on file  . Number of children: Not on file  . Years  of education: Not on file  . Highest education level: Not on file  Occupational History  . Not on file  Social Needs  . Financial resource strain: Not on file  . Food insecurity:    Worry: Not on file    Inability: Not on file  . Transportation needs:    Medical: Not on file    Non-medical: Not on file  Tobacco Use  . Smoking status: Never Smoker  . Smokeless tobacco: Never Used  Substance and Sexual Activity  . Alcohol use: Yes    Comment: occas   . Drug use: No  . Sexual activity: Yes    Partners: Male  Lifestyle  . Physical activity:    Days per week: Not on file    Minutes per session: Not on file  . Stress: Not on file  Relationships  . Social connections:    Talks on phone: Not on file    Gets together: Not on file    Attends religious service: Not on file    Active member of club or organization: Not on file    Attends meetings of clubs or organizations: Not on file    Relationship status: Not on file  .  Intimate partner violence:    Fear of current or ex partner: Not on file    Emotionally abused: Not on file    Physically abused: Not on file    Forced sexual activity: Not on file  Other Topics Concern  . Not on file  Social History Narrative  . Not on file    Outpatient Medications Prior to Visit  Medication Sig Dispense Refill  . albuterol (VENTOLIN HFA) 108 (90 Base) MCG/ACT inhaler Inhale 2 puffs into the lungs every 6 (six) hours as needed for wheezing or shortness of breath. 1 Inhaler 2  . ALPRAZolam (XANAX) 0.25 MG tablet Take 1 tablet (0.25 mg total) by mouth 2 (two) times daily as needed for anxiety. 5 tablet 0  . aspirin EC 325 MG tablet Take 1 tablet (325 mg total) by mouth 2 (two) times daily. 60 tablet 0  . budesonide (PULMICORT) 180 MCG/ACT inhaler Inhale 2 puffs into the lungs 2 (two) times daily. 3 Inhaler 11  . fenofibrate (TRICOR) 145 MG tablet TAKE 1 TABLET BY MOUTH EVERY DAY 90 tablet 1  . fluticasone (FLONASE) 50 MCG/ACT nasal spray  Place 2 sprays into both nostrils daily. 16 g 1  . levocetirizine (XYZAL) 5 MG tablet Take 1 tablet (5 mg total) by mouth every evening. 30 tablet 5  . metoprolol succinate (TOPROL-XL) 50 MG 24 hr tablet TAKE 1 TABLET (50 MG TOTAL) BY MOUTH DAILY. TAKE WITH OR IMMEDIATELY FOLLOWING A MEAL. 90 tablet 1  . montelukast (SINGULAIR) 10 MG tablet TAKE 1 TABLET BY MOUTH EVERYDAY AT BEDTIME 90 tablet 1  . valsartan-hydrochlorothiazide (DIOVAN-HCT) 160-12.5 MG tablet TAKE 1 TABLET BY MOUTH EVERY DAY 90 tablet 1   No facility-administered medications prior to visit.     Allergies  Allergen Reactions  . Codeine Palpitations and Rash    Review of Systems  Constitutional: Negative for fever and malaise/fatigue.  HENT: Negative for congestion.   Eyes: Negative for blurred vision.  Respiratory: Negative for cough and shortness of breath.   Cardiovascular: Negative for chest pain, palpitations and leg swelling.  Gastrointestinal: Negative for abdominal pain, blood in stool and nausea.  Genitourinary: Negative for dysuria and frequency.  Musculoskeletal: Negative for falls.  Skin: Negative for rash.  Neurological: Negative for dizziness, loss of consciousness and headaches.  Endo/Heme/Allergies: Negative for environmental allergies.  Psychiatric/Behavioral: Negative for depression. The patient is not nervous/anxious.        Objective:    Physical Exam  Constitutional: She is oriented to person, place, and time. She appears well-developed and well-nourished. No distress.  HENT:  Head: Normocephalic and atraumatic.  Nose: Nose normal.  Eyes: Right eye exhibits no discharge. Left eye exhibits no discharge.  Neck: Normal range of motion. Neck supple. Tracheal deviation present. Thyromegaly present.  Cardiovascular: Normal rate and regular rhythm.  No murmur heard. Pulmonary/Chest: Effort normal and breath sounds normal.  Abdominal: Soft. Bowel sounds are normal. There is no tenderness.    Genitourinary: Vagina normal and uterus normal. No vaginal discharge found.  Musculoskeletal: She exhibits no edema or tenderness.  Neurological: She is alert and oriented to person, place, and time. She displays normal reflexes. No cranial nerve deficit. Coordination normal.  Skin: Skin is warm and dry. No rash noted. No erythema.  Psychiatric: She has a normal mood and affect.  Nursing note and vitals reviewed.   BP 134/74   Pulse 82   Temp 98.2 F (36.8 C) (Oral)   Resp 18   Ht 5'  8.5" (1.74 m)   Wt 244 lb 6.4 oz (110.9 kg)   SpO2 97%   BMI 36.62 kg/m  Wt Readings from Last 3 Encounters:  11/27/17 244 lb 6.4 oz (110.9 kg)  05/17/17 247 lb 6.4 oz (112.2 kg)  11/16/16 250 lb 3.2 oz (113.5 kg)     Lab Results  Component Value Date   WBC 8.2 11/27/2017   HGB 13.4 11/27/2017   HCT 39.3 11/27/2017   PLT 455.0 (H) 11/27/2017   GLUCOSE 85 11/27/2017   CHOL 165 11/27/2017   TRIG 134.0 11/27/2017   HDL 40.70 11/27/2017   LDLDIRECT 133.4 02/08/2011   LDLCALC 97 11/27/2017   ALT 33 11/27/2017   AST 24 11/27/2017   NA 142 11/27/2017   K 3.6 11/27/2017   CL 103 11/27/2017   CREATININE 0.62 11/27/2017   BUN 14 11/27/2017   CO2 29 11/27/2017   TSH 1.80 11/27/2017   INR 1.03 10/08/2015   HGBA1C 4.8 05/12/2016    Lab Results  Component Value Date   TSH 1.80 11/27/2017   Lab Results  Component Value Date   WBC 8.2 11/27/2017   HGB 13.4 11/27/2017   HCT 39.3 11/27/2017   MCV 92.7 11/27/2017   PLT 455.0 (H) 11/27/2017   Lab Results  Component Value Date   NA 142 11/27/2017   K 3.6 11/27/2017   CO2 29 11/27/2017   GLUCOSE 85 11/27/2017   BUN 14 11/27/2017   CREATININE 0.62 11/27/2017   BILITOT 0.5 11/27/2017   ALKPHOS 63 11/27/2017   AST 24 11/27/2017   ALT 33 11/27/2017   PROT 7.1 11/27/2017   ALBUMIN 4.2 11/27/2017   CALCIUM 9.6 11/27/2017   ANIONGAP 7 10/16/2015   GFR 104.86 11/27/2017   Lab Results  Component Value Date   CHOL 165 11/27/2017    Lab Results  Component Value Date   HDL 40.70 11/27/2017   Lab Results  Component Value Date   LDLCALC 97 11/27/2017   Lab Results  Component Value Date   TRIG 134.0 11/27/2017   Lab Results  Component Value Date   CHOLHDL 4 11/27/2017   Lab Results  Component Value Date   HGBA1C 4.8 05/12/2016       Assessment & Plan:   Problem List Items Addressed This Visit    Hypertension    Well controlled, no changes to meds. Encouraged heart healthy diet such as the DASH diet and exercise as tolerated.       Relevant Orders   CBC (Completed)   Comprehensive metabolic panel (Completed)   TSH (Completed)   Obesity    Encouraged DASH diet, decrease po intake and increase exercise as tolerated. Needs 7-8 hours of sleep nightly. Avoid trans fats, eat small, frequent meals every 4-5 hours with lean proteins, complex carbs and healthy fats. Minimize simple carbs      Preventative health care    Patient encouraged to maintain heart healthy diet, regular exercise, adequate sleep. Consider daily probiotics. Take medications as prescribed. She had her flu shot at work in October will try and get record. Shingrix number one today      Cervical cancer screening - Primary    Pap today, no concerns on exam.       Relevant Orders   Cytology - PAP( [place]) (Completed)   Hyperlipidemia    Encouraged heart healthy diet, increase exercise, avoid trans fats, consider a krill oil cap daily      Relevant Orders   Lipid  panel (Completed)   Tachycardia    RRR today         I am having Kathleen Church maintain her budesonide, aspirin EC, levocetirizine, fluticasone, albuterol, ALPRAZolam, fenofibrate, montelukast, metoprolol succinate, and valsartan-hydrochlorothiazide.  No orders of the defined types were placed in this encounter.    Danise Edge, MD

## 2017-11-27 NOTE — Assessment & Plan Note (Signed)
Pap today, no concerns on exam.  

## 2017-11-28 LAB — CYTOLOGY - PAP
Diagnosis: NEGATIVE
HPV: NOT DETECTED

## 2017-12-13 ENCOUNTER — Ambulatory Visit
Admission: RE | Admit: 2017-12-13 | Discharge: 2017-12-13 | Disposition: A | Discharge status: Home / Self Care | Payer: 59 | Source: Ambulatory Visit | Attending: Family Medicine | Admitting: Family Medicine

## 2017-12-13 DIAGNOSIS — Z1231 Encounter for screening mammogram for malignant neoplasm of breast: Secondary | ICD-10-CM | POA: Diagnosis not present

## 2018-01-29 DIAGNOSIS — M25561 Pain in right knee: Secondary | ICD-10-CM | POA: Diagnosis not present

## 2018-01-29 DIAGNOSIS — M1711 Unilateral primary osteoarthritis, right knee: Secondary | ICD-10-CM | POA: Diagnosis not present

## 2018-02-07 ENCOUNTER — Ambulatory Visit: Payer: 59

## 2018-02-12 DIAGNOSIS — I10 Essential (primary) hypertension: Secondary | ICD-10-CM | POA: Diagnosis not present

## 2018-02-12 DIAGNOSIS — J0140 Acute pansinusitis, unspecified: Secondary | ICD-10-CM | POA: Diagnosis not present

## 2018-02-12 DIAGNOSIS — R05 Cough: Secondary | ICD-10-CM | POA: Diagnosis not present

## 2018-02-13 ENCOUNTER — Ambulatory Visit: Payer: 59

## 2018-02-15 ENCOUNTER — Other Ambulatory Visit: Payer: Self-pay | Admitting: Medical

## 2018-02-15 DIAGNOSIS — R05 Cough: Secondary | ICD-10-CM

## 2018-02-15 DIAGNOSIS — R059 Cough, unspecified: Secondary | ICD-10-CM

## 2018-03-05 ENCOUNTER — Ambulatory Visit (INDEPENDENT_AMBULATORY_CARE_PROVIDER_SITE_OTHER): Payer: 59 | Admitting: *Deleted

## 2018-03-05 DIAGNOSIS — Z23 Encounter for immunization: Secondary | ICD-10-CM | POA: Diagnosis not present

## 2018-03-05 NOTE — Progress Notes (Signed)
Patient in today for second shingles vaccine  Vaccine given and patient tolerated well. 

## 2018-04-23 ENCOUNTER — Other Ambulatory Visit: Payer: Self-pay | Admitting: Family Medicine

## 2018-04-24 DIAGNOSIS — M1711 Unilateral primary osteoarthritis, right knee: Secondary | ICD-10-CM | POA: Diagnosis not present

## 2018-05-10 ENCOUNTER — Other Ambulatory Visit: Payer: Self-pay | Admitting: Family Medicine

## 2018-06-14 ENCOUNTER — Telehealth: Payer: Self-pay | Admitting: Family Medicine

## 2018-06-14 MED ORDER — VALSARTAN-HYDROCHLOROTHIAZIDE 160-12.5 MG PO TABS: 1.0000 | ORAL_TABLET | Freq: Every day | ORAL | 1 refills | Status: DC

## 2018-06-14 NOTE — Telephone Encounter (Signed)
Medication sent in. 

## 2018-06-14 NOTE — Telephone Encounter (Signed)
Copied from CRM [id number]. Topic: Quick Communication - Rx Refill/Question >> Jun 14, 2018 10:07 AM Doreatha Massed wrote: Medication: valsartan-hydrochlorothiazide (DIOVAN-HCT) 160-12.5 MG tablet  Has the patient contacted their pharmacy? Yes (Agent: If no, request that the patient contact the pharmacy for the refill.) (Agent: If yes, when and what did the pharmacy advise?) pharmacy didn't receive refill from office  Preferred Pharmacy (with phone number or street name): [email or web address]  Agent: Please be advised that RX refills may take up to 3 business days. We ask that you follow-up with your pharmacy.

## 2018-11-11 ENCOUNTER — Other Ambulatory Visit: Payer: Self-pay | Admitting: Family Medicine

## 2018-11-12 IMAGING — DX DG CHEST 2V
2 series · 2 of 2 positions shown · non-contrast
Comparison: None.

CLINICAL DATA: Coughing congestion for 2 weeks.  05/02/2010

EXAM:
CHEST  2 VIEW

[chest pa]
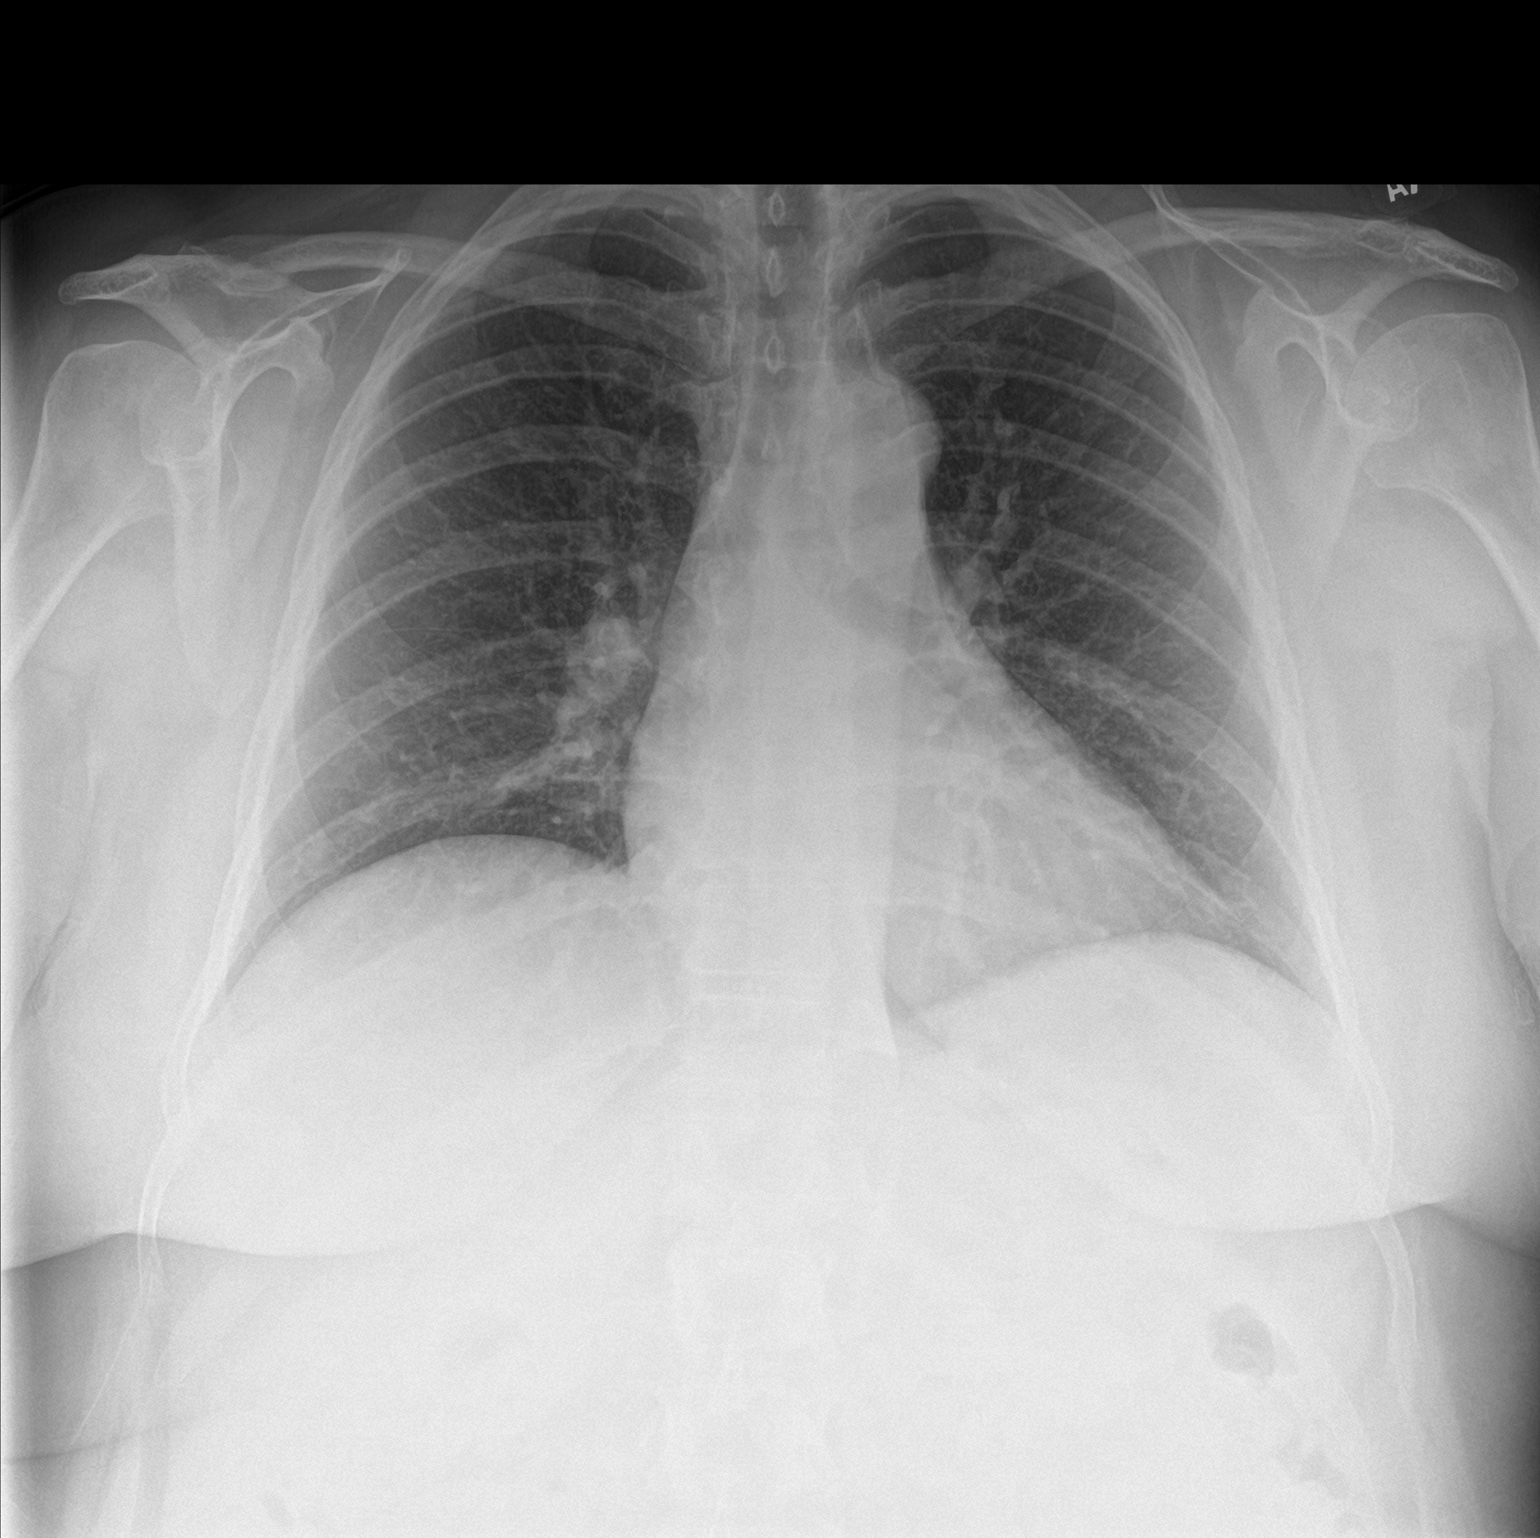

[chest lat]
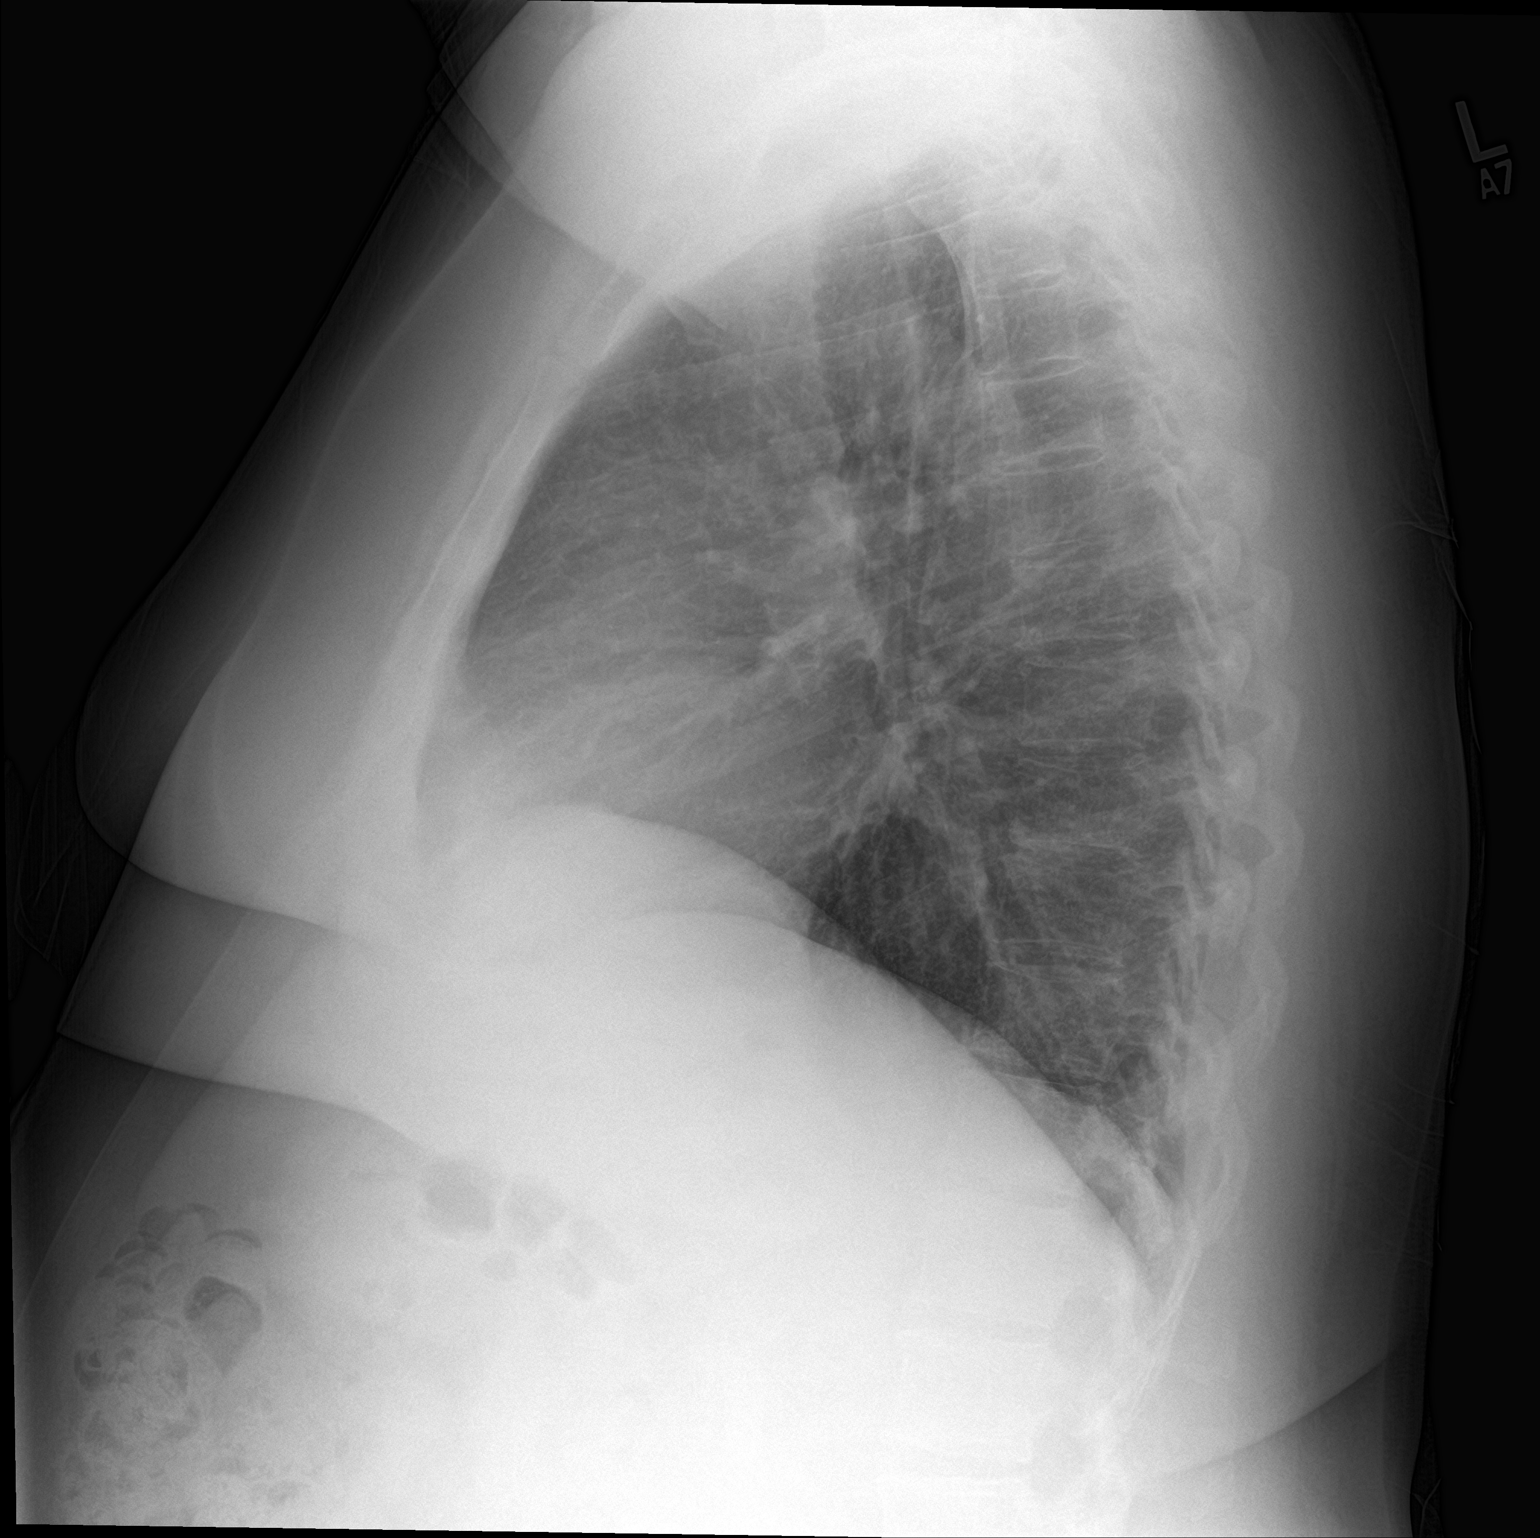

[2 of 2 positions shown; findings below may reference images not displayed]

FINDINGS: The heart size and mediastinal contours are within normal limits.
Both lungs are clear. The visualized skeletal structures are
unremarkable.
IMPRESSION: No active cardiopulmonary disease.

## 2018-11-29 ENCOUNTER — Other Ambulatory Visit: Payer: Self-pay

## 2018-12-02 ENCOUNTER — Encounter: Payer: Self-pay | Admitting: Family Medicine

## 2018-12-02 ENCOUNTER — Ambulatory Visit (INDEPENDENT_AMBULATORY_CARE_PROVIDER_SITE_OTHER): Payer: 59 | Admitting: Family Medicine

## 2018-12-02 ENCOUNTER — Other Ambulatory Visit: Payer: Self-pay

## 2018-12-02 VITALS — BP 134/86 | HR 83 | Temp 98.3°F | Resp 18 | Wt 247.4 lb

## 2018-12-02 DIAGNOSIS — Z0000 Encounter for general adult medical examination without abnormal findings: Secondary | ICD-10-CM | POA: Diagnosis not present

## 2018-12-02 DIAGNOSIS — E6609 Other obesity due to excess calories: Secondary | ICD-10-CM

## 2018-12-02 DIAGNOSIS — I10 Essential (primary) hypertension: Secondary | ICD-10-CM

## 2018-12-02 DIAGNOSIS — R000 Tachycardia, unspecified: Secondary | ICD-10-CM

## 2018-12-02 DIAGNOSIS — E785 Hyperlipidemia, unspecified: Secondary | ICD-10-CM | POA: Diagnosis not present

## 2018-12-02 DIAGNOSIS — F419 Anxiety disorder, unspecified: Secondary | ICD-10-CM | POA: Diagnosis not present

## 2018-12-02 DIAGNOSIS — Z1239 Encounter for other screening for malignant neoplasm of breast: Secondary | ICD-10-CM

## 2018-12-02 LAB — CBC
HCT: 40.4 % (ref 36.0–46.0)
Hemoglobin: 13.3 g/dL (ref 12.0–15.0)
MCHC: 33 g/dL (ref 30.0–36.0)
MCV: 93.3 fl (ref 78.0–100.0)
Platelets: 419 10*3/uL — ABNORMAL HIGH (ref 150.0–400.0)
RBC: 4.33 Mil/uL (ref 3.87–5.11)
RDW: 13.2 % (ref 11.5–15.5)
WBC: 8.8 10*3/uL (ref 4.0–10.5)

## 2018-12-02 LAB — LIPID PANEL
Cholesterol: 167 mg/dL (ref 0–200)
HDL: 45.6 mg/dL (ref >39.00)
LDL Cholesterol: 100 mg/dL — ABNORMAL HIGH (ref 0–99)
NonHDL: 121.56
Total CHOL/HDL Ratio: 4
Triglycerides: 109 mg/dL (ref 0.0–149.0)
VLDL: 21.8 mg/dL (ref 0.0–40.0)

## 2018-12-02 LAB — COMPREHENSIVE METABOLIC PANEL
ALT: 26 U/L (ref 0–35)
AST: 20 U/L (ref 0–37)
Albumin: 3.9 g/dL (ref 3.5–5.2)
Alkaline Phosphatase: 62 U/L (ref 39–117)
BUN: 18 mg/dL (ref 6–23)
CO2: 29 mEq/L (ref 19–32)
Calcium: 9.5 mg/dL (ref 8.4–10.5)
Chloride: 102 mEq/L (ref 96–112)
Creatinine, Ser: 0.63 mg/dL (ref 0.40–1.20)
GFR: 96.51 mL/min (ref >60.00)
Glucose, Bld: 79 mg/dL (ref 70–99)
Potassium: 3.9 mEq/L (ref 3.5–5.1)
Sodium: 141 mEq/L (ref 135–145)
Total Bilirubin: 0.6 mg/dL (ref 0.2–1.2)
Total Protein: 7 g/dL (ref 6.0–8.3)

## 2018-12-02 LAB — HEMOGLOBIN A1C: Hgb A1c MFr Bld: 4.9 % (ref 4.6–6.5)

## 2018-12-02 LAB — TSH: TSH: 1.24 u[IU]/mL (ref 0.35–4.50)

## 2018-12-02 MED ORDER — FENOFIBRATE 145 MG PO TABS: 145.0000 mg | ORAL_TABLET | Freq: Every day | ORAL | 1 refills | Status: DC

## 2018-12-02 NOTE — Assessment & Plan Note (Addendum)
Is managing but did loose her mother in July from metastatic lung cancer and stroke.

## 2018-12-02 NOTE — Assessment & Plan Note (Signed)
Encouraged DASH diet, decrease po intake and increase exercise as tolerated. Needs 7-8 hours of sleep nightly. Avoid trans fats, eat small, frequent meals every 4-5 hours with lean proteins, complex carbs and healthy fats. Minimize simple carbs, consider for [facility] APP

## 2018-12-02 NOTE — Assessment & Plan Note (Signed)
Encouraged heart healthy diet, increase exercise, avoid trans fats, consider a krill oil cap daily 

## 2018-12-02 NOTE — Assessment & Plan Note (Signed)
RRR 

## 2018-12-02 NOTE — Assessment & Plan Note (Addendum)
Patient encouraged to maintain heart healthy diet, regular exercise, adequate sleep. Consider daily probiotics. Take medications as prescribe. Last pap normal. Not due this year, repeat every 3 years, MGM UTD. She continues to decline colonoscopy but agrees to Cologuard so this is ordered

## 2018-12-02 NOTE — Progress Notes (Signed)
Patient ID: Kathleen Church, female   DOB: January 16, 1960, 58 y.o.   MRN: [id number]   Subjective:    Patient ID: Kathleen Church, female    DOB: 07-28-1960, 58 y.o.   MRN: [id number]  No chief complaint on file.   HPI Patient is in today for annual preventative exam and follow up on chronic medical concerns including hyperlipidemia, hypertension, obesity and more. She feels well today but she is noting she lost her mother at age 3 in July. She had been having headaches. 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 She is managing well enough but has been sad. She also has a new 52 month old grandson so that has helped to mitigate her grief. No recent febrile illness or hospitalizations. She is working from home. Is not exercising regularly but is maintaining quarantine well. Denies CP/palp/SOB/HA/congestion/fevers/GI or GU c/o. Taking meds as prescribed  Past Medical History:  Diagnosis Date  . Allergic state   . Anxiety    when father passed  . Arthritis   . Asthma, mild persistent    allergy induced asthma   . Cervical cancer screening 04/27/2011  . Edema 04/27/2011  . Heart murmur    as an infant   . Hyperlipidemia 04/27/2011  . Hypertension 2010  . Hypokalemia 04/27/2011  . Measles as a child  . Mumps as a child  . Muscle cramp 10/14/2015  . Obesity   . Osteoarthritis of left knee 10/14/2015  . Plantar fasciitis, left 01/19/2013  . Preventative health care 02/13/2011  . Sinusitis 02/13/2011    Past Surgical History:  Procedure Laterality Date  . ENDOMETRIAL ABLATION  2006   menorraghia  . KNEE SURGERY  2006   left knee  . TONSILLECTOMY    . TOTAL KNEE ARTHROPLASTY Left 10/15/2015   Procedure: LEFT TOTAL KNEE ARTHROPLASTY;  Surgeon: Sydnee Cabal, MD;  Location: [facility];  Service: Orthopedics;  Laterality: Left;  . TUBAL LIGATION      Family History  Problem Relation Age of Onset  . Cancer Father        kidney  . Cancer Maternal Grandmother    bone  . Cancer Paternal Grandmother        breast  . Breast cancer Paternal Grandmother   . Asthma Daughter   . Heart attack Paternal Grandfather   . Cancer Mother 38       metastatic lung cancer  . Stroke Mother   . Asthma Daughter     Social History   Socioeconomic History  . Marital status: Widowed    Spouse name: Not on file  . Number of children: Not on file  . Years of education: Not on file  . Highest education level: Not on file  Occupational History  . Not on file  Social Needs  . Financial resource strain: Not on file  . Food insecurity    Worry: Not on file    Inability: Not on file  . Transportation needs    Medical: Not on file    Non-medical: Not on file  Tobacco Use  . Smoking status: Never Smoker  . Smokeless tobacco: Never Used  Substance and Sexual Activity  . Alcohol use: Yes    Comment: occas   . Drug use: No  . Sexual activity: Yes    Partners: Male  Lifestyle  . Physical activity    Days per week: Not on file    Minutes per  session: Not on file  . Stress: Not on file  Relationships  . Social Musician on phone: Not on file    Gets together: Not on file    Attends religious service: Not on file    Active member of club or organization: Not on file    Attends meetings of clubs or organizations: Not on file    Relationship status: Not on file  . Intimate partner violence    Fear of current or ex partner: Not on file    Emotionally abused: Not on file    Physically abused: Not on file    Forced sexual activity: Not on file  Other Topics Concern  . Not on file  Social History Narrative  . Not on file    Outpatient Medications Prior to Visit  Medication Sig Dispense Refill  . ALPRAZolam (XANAX) 0.25 MG tablet Take 1 tablet (0.25 mg total) by mouth 2 (two) times daily as needed for anxiety. 5 tablet 0  . aspirin EC 325 MG tablet Take 1 tablet (325 mg total) by mouth 2 (two) times daily. 60 tablet 0  . budesonide (PULMICORT)  180 MCG/ACT inhaler Inhale 2 puffs into the lungs 2 (two) times daily. 3 Inhaler 11  . fluticasone (FLONASE) 50 MCG/ACT nasal spray Place 2 sprays into both nostrils daily. 16 g 1  . levocetirizine (XYZAL) 5 MG tablet Take 1 tablet (5 mg total) by mouth every evening. 30 tablet 5  . metoprolol succinate (TOPROL-XL) 50 MG 24 hr tablet TAKE 1 TABLET (50 MG TOTAL) BY MOUTH DAILY. TAKE WITH OR IMMEDIATELY FOLLOWING A MEAL. 90 tablet 1  . montelukast (SINGULAIR) 10 MG tablet TAKE 1 TABLET BY MOUTH EVERYDAY AT BEDTIME 90 tablet 1  . PROAIR HFA 108 (90 Base) MCG/ACT inhaler TAKE 2 PUFFS BY MOUTH EVERY 6 HOURS AS NEEDED FOR WHEEZE OR SHORTNESS OF BREATH 8.5 Inhaler 2  . valsartan-hydrochlorothiazide (DIOVAN-HCT) 160-12.5 MG tablet Take 1 tablet by mouth daily. 90 tablet 1  . fenofibrate (TRICOR) 145 MG tablet TAKE 1 TABLET BY MOUTH EVERY DAY 90 tablet 1   No facility-administered medications prior to visit.     Allergies  Allergen Reactions  . Codeine Palpitations and Rash    Review of Systems  Constitutional: Negative for chills, fever and malaise/fatigue.  HENT: Negative for congestion and hearing loss.   Eyes: Negative for discharge.  Respiratory: Negative for cough, sputum production and shortness of breath.   Cardiovascular: Negative for chest pain, palpitations and leg swelling.  Gastrointestinal: Negative for abdominal pain, blood in stool, constipation, diarrhea, heartburn, nausea and vomiting.  Genitourinary: Negative for dysuria, frequency, hematuria and urgency.  Musculoskeletal: Negative for back pain, falls and myalgias.  Skin: Negative for rash.  Neurological: Negative for dizziness, sensory change, loss of consciousness, weakness and headaches.  Endo/Heme/Allergies: Negative for environmental allergies. Does not bruise/bleed easily.  Psychiatric/Behavioral: Negative for depression, hallucinations and suicidal ideas. The patient is nervous/anxious. The patient does not have  insomnia.        Objective:    Physical Exam Constitutional:      General: She is not in acute distress.    Appearance: She is not diaphoretic.  HENT:     Head: Normocephalic and atraumatic.     Right Ear: External ear normal.     Left Ear: External ear normal.     Nose: Nose normal.     Mouth/Throat:     Pharynx: No oropharyngeal exudate.  Eyes:  General: No scleral icterus.       Right eye: No discharge.        Left eye: No discharge.     Conjunctiva/sclera: Conjunctivae normal.     Pupils: Pupils are equal, round, and reactive to light.  Neck:     Musculoskeletal: Normal range of motion and neck supple.     Thyroid: No thyromegaly.  Cardiovascular:     Rate and Rhythm: Normal rate and regular rhythm.     Heart sounds: Normal heart sounds. No murmur.  Pulmonary:     Effort: Pulmonary effort is normal. No respiratory distress.     Breath sounds: Normal breath sounds. No wheezing or rales.  Abdominal:     General: Bowel sounds are normal. There is no distension.     Palpations: Abdomen is soft. There is no mass.     Tenderness: There is no abdominal tenderness.  Musculoskeletal: Normal range of motion.        General: No tenderness.  Lymphadenopathy:     Cervical: No cervical adenopathy.  Skin:    General: Skin is warm and dry.     Findings: No rash.  Neurological:     Mental Status: She is alert and oriented to person, place, and time.     Cranial Nerves: No cranial nerve deficit.     Coordination: Coordination normal.     Deep Tendon Reflexes: Reflexes are normal and symmetric. Reflexes normal.     BP 134/86   Pulse 83   Temp 98.3 F (36.8 C) (Temporal)   Resp 18   Wt 247 lb 6.4 oz (112.2 kg)   SpO2 97%   BMI 37.07 kg/m  Wt Readings from Last 3 Encounters:  12/02/18 247 lb 6.4 oz (112.2 kg)  11/27/17 244 lb 6.4 oz (110.9 kg)  05/17/17 247 lb 6.4 oz (112.2 kg)    Diabetic Foot Exam - Simple   No data filed     Lab Results  Component Value  Date   WBC 8.2 11/27/2017   HGB 13.4 11/27/2017   HCT 39.3 11/27/2017   PLT 455.0 (H) 11/27/2017   GLUCOSE 85 11/27/2017   CHOL 165 11/27/2017   TRIG 134.0 11/27/2017   HDL 40.70 11/27/2017   LDLDIRECT 133.4 02/08/2011   LDLCALC 97 11/27/2017   ALT 33 11/27/2017   AST 24 11/27/2017   NA 142 11/27/2017   K 3.6 11/27/2017   CL 103 11/27/2017   CREATININE 0.62 11/27/2017   BUN 14 11/27/2017   CO2 29 11/27/2017   TSH 1.80 11/27/2017   INR 1.03 10/08/2015   HGBA1C 4.8 05/12/2016    Lab Results  Component Value Date   TSH 1.80 11/27/2017   Lab Results  Component Value Date   WBC 8.2 11/27/2017   HGB 13.4 11/27/2017   HCT 39.3 11/27/2017   MCV 92.7 11/27/2017   PLT 455.0 (H) 11/27/2017   Lab Results  Component Value Date   NA 142 11/27/2017   K 3.6 11/27/2017   CO2 29 11/27/2017   GLUCOSE 85 11/27/2017   BUN 14 11/27/2017   CREATININE 0.62 11/27/2017   BILITOT 0.5 11/27/2017   ALKPHOS 63 11/27/2017   AST 24 11/27/2017   ALT 33 11/27/2017   PROT 7.1 11/27/2017   ALBUMIN 4.2 11/27/2017   CALCIUM 9.6 11/27/2017   ANIONGAP 7 10/16/2015   GFR 104.86 11/27/2017   Lab Results  Component Value Date   CHOL 165 11/27/2017   Lab Results  Component Value  Date   HDL 40.70 11/27/2017   Lab Results  Component Value Date   LDLCALC 97 11/27/2017   Lab Results  Component Value Date   TRIG 134.0 11/27/2017   Lab Results  Component Value Date   CHOLHDL 4 11/27/2017   Lab Results  Component Value Date   HGBA1C 4.8 05/12/2016       Assessment & Plan:   Problem List Items Addressed This Visit    Anxiety    Is managing but did loose her mother in July from metastatic lung cancer and stroke.       Hypertension - Primary    Well controlled, no changes to meds. Encouraged heart healthy diet such as the DASH diet and exercise as tolerated.       Relevant Medications   fenofibrate (TRICOR) 145 MG tablet   Other Relevant Orders   CBC   CMP   TSH   CBC    CMP   TSH   Obesity    Encouraged DASH diet, decrease po intake and increase exercise as tolerated. Needs 7-8 hours of sleep nightly. Avoid trans fats, eat small, frequent meals every 4-5 hours with lean proteins, complex carbs and healthy fats. Minimize simple carbs, consider for [employer] APP      Relevant Orders   A1C   Preventative health care    Patient encouraged to maintain heart healthy diet, regular exercise, adequate sleep. Consider daily probiotics. Take medications as prescribe. Last pap normal. Not due this year, repeat every 3 years, MGM UTD. She continues to decline colonoscopy but agrees to Cologuard so this is ordered      Hyperlipidemia    Encouraged heart healthy diet, increase exercise, avoid trans fats, consider a krill oil cap daily      Relevant Medications   fenofibrate (TRICOR) 145 MG tablet   Other Relevant Orders   Lipid panel   Lipid panel   Tachycardia    RRR       Other Visit Diagnoses    Encounter for screening for malignant neoplasm of breast, unspecified screening modality       Relevant Orders   MAMMOGRAM 3-D DIGITAL (BRE[facility])      I have changed Kathleen Church's fenofibrate. I am also having her maintain her budesonide, aspirin EC, levocetirizine, fluticasone, ALPRAZolam, ProAir HFA, valsartan-hydrochlorothiazide, montelukast, and metoprolol succinate.  Meds ordered this encounter  Medications  . fenofibrate (TRICOR) 145 MG tablet    Sig: Take 1 tablet (145 mg total) by mouth daily.    Dispense:  90 tablet    Refill:  1     Danise EdgeStacey Blyth, MD

## 2018-12-02 NOTE — Patient Instructions (Addendum)
Weight Watchers APP or MIND diet  Omron blood pressure cuff, upper arm   Pulse oximeter, want oxygen in 90s   Multivitamin with minerals dailly, selenium, zinc, vitamin c and d Vitamin D 2000 IU daily ECASA 81 mg daily Melatonin 1-5 mg at bedtime  If ill Vitamin c [id number] mg twice daily Zinc 30-50 daily elderberry Preventive Care 59-59 Years Old, Female Preventive care refers to visits with your health care provider and lifestyle choices that can promote health and wellness. This includes:  A yearly physical exam. This may also be called an annual well check.  Regular dental visits and eye exams.  Immunizations.  Screening for certain conditions.  Healthy lifestyle choices, such as eating a healthy diet, getting regular exercise, not using drugs or products that contain nicotine and tobacco, and limiting alcohol use. What can I expect for my preventive care visit? Physical exam Your health care provider will check your:  Height and weight. This may be used to calculate body mass index (BMI), which tells if you are at a healthy weight.  Heart rate and blood pressure.  Skin for abnormal spots. Counseling Your health care provider may ask you questions about your:  Alcohol, tobacco, and drug use.  Emotional well-being.  Home and relationship well-being.  Sexual activity.  Eating habits.  Work and work Statistician.  Method of birth control.  Menstrual cycle.  Pregnancy history. What immunizations do I need?  Influenza (flu) vaccine  This is recommended every year. Tetanus, diphtheria, and pertussis (Tdap) vaccine  You may need a Td booster every 10 years. Varicella (chickenpox) vaccine  You may need this if you have not been vaccinated. Zoster (shingles) vaccine  You may need this after age 73. Measles, mumps, and rubella (MMR) vaccine  You may need at least one dose of MMR if you were born in 1957 or later. You may also need a second  dose. Pneumococcal conjugate (PCV13) vaccine  You may need this if you have certain conditions and were not previously vaccinated. Pneumococcal polysaccharide (PPSV23) vaccine  You may need one or two doses if you smoke cigarettes or if you have certain conditions. Meningococcal conjugate (MenACWY) vaccine  You may need this if you have certain conditions. Hepatitis A vaccine  You may need this if you have certain conditions or if you travel or work in places where you may be exposed to hepatitis A. Hepatitis B vaccine  You may need this if you have certain conditions or if you travel or work in places where you may be exposed to hepatitis B. Haemophilus influenzae type b (Hib) vaccine  You may need this if you have certain conditions. Human papillomavirus (HPV) vaccine  If recommended by your health care provider, you may need three doses over 6 months. You may receive vaccines as individual doses or as more than one vaccine together in one shot (combination vaccines). Talk with your health care provider about the risks and benefits of combination vaccines. What tests do I need? Blood tests  Lipid and cholesterol levels. These may be checked every 5 years, or more frequently if you are over 24 years old.  Hepatitis C test.  Hepatitis B test. Screening  Lung cancer screening. You may have this screening every year starting at age 63 if you have a 30-pack-year history of smoking and currently smoke or have quit within the past 15 years.  Colorectal cancer screening. All adults should have this screening starting at age 1 and continuing until  age 29. Your health care provider may recommend screening at age 71 if you are at increased risk. You will have tests every 1-10 years, depending on your results and the type of screening test.  Diabetes screening. This is done by checking your blood sugar (glucose) after you have not eaten for a while (fasting). You may have this done every  1-3 years.  Mammogram. This may be done every 1-2 years. Talk with your health care provider about when you should start having regular mammograms. This may depend on whether you have a family history of breast cancer.  BRCA-related cancer screening. This may be done if you have a family history of breast, ovarian, tubal, or peritoneal cancers.  Pelvic exam and Pap test. This may be done every 3 years starting at age 70. Starting at age 50, this may be done every 5 years if you have a Pap test in combination with an HPV test. Other tests  Sexually transmitted disease (STD) testing.  Bone density scan. This is done to screen for osteoporosis. You may have this scan if you are at high risk for osteoporosis. Follow these instructions at home: Eating and drinking  Eat a diet that includes fresh fruits and vegetables, whole grains, lean protein, and low-fat dairy.  Take vitamin and mineral supplements as recommended by your health care provider.  Do not drink alcohol if: ? Your health care provider tells you not to drink. ? You are pregnant, may be pregnant, or are planning to become pregnant.  If you drink alcohol: ? Limit how much you have to 0-1 drink a day. ? Be aware of how much alcohol is in your drink. In the [place] one drink equals one 12 oz bottle of beer (355 mL), one 5 oz glass of wine (148 mL), or one 1 oz glass of hard liquor (44 mL). Lifestyle  Take daily care of your teeth and gums.  Stay active. Exercise for at least 30 minutes on 5 or more days each week.  Do not use any products that contain nicotine or tobacco, such as cigarettes, e-cigarettes, and chewing tobacco. If you need help quitting, ask your health care provider.  If you are sexually active, practice safe sex. Use a condom or other form of birth control (contraception) in order to prevent pregnancy and STIs (sexually transmitted infections).  If told by your health care provider, take low-dose aspirin daily  starting at age 37. What's next?  Visit your health care provider once a year for a well check visit.  Ask your health care provider how often you should have your eyes and teeth checked.  Stay up to date on all vaccines. This information is not intended to replace advice given to you by your health care provider. Make sure you discuss any questions you have with your health care provider. Document Released: 01/22/2015 Document Revised: 09/06/2017 Document Reviewed: 09/06/2017 Elsevier Patient Education  2020 [employer].

## 2018-12-02 NOTE — Assessment & Plan Note (Signed)
Well controlled, no changes to meds. Encouraged heart healthy diet such as the DASH diet and exercise as tolerated.  °

## 2018-12-23 ENCOUNTER — Other Ambulatory Visit: Payer: Self-pay | Admitting: Family Medicine

## 2019-01-24 ENCOUNTER — Other Ambulatory Visit: Payer: Self-pay

## 2019-01-24 ENCOUNTER — Ambulatory Visit
Admission: RE | Admit: 2019-01-24 | Discharge: 2019-01-24 | Disposition: A | Discharge status: Home / Self Care | Payer: 59 | Source: Ambulatory Visit | Attending: Family Medicine | Admitting: Family Medicine

## 2019-01-24 DIAGNOSIS — Z1239 Encounter for other screening for malignant neoplasm of breast: Secondary | ICD-10-CM

## 2019-04-05 ENCOUNTER — Other Ambulatory Visit: Payer: Self-pay | Admitting: Family Medicine

## 2019-05-10 ENCOUNTER — Other Ambulatory Visit: Payer: Self-pay | Admitting: Family Medicine

## 2019-05-12 ENCOUNTER — Other Ambulatory Visit: Payer: Self-pay | Admitting: Family Medicine

## 2019-06-20 ENCOUNTER — Other Ambulatory Visit: Payer: Self-pay | Admitting: Family Medicine

## 2019-09-02 ENCOUNTER — Other Ambulatory Visit: Payer: Self-pay | Admitting: Nurse Practitioner

## 2019-09-02 ENCOUNTER — Other Ambulatory Visit: Payer: Self-pay

## 2019-09-02 ENCOUNTER — Telehealth: Payer: Self-pay | Admitting: Family Medicine

## 2019-09-02 ENCOUNTER — Telehealth (INDEPENDENT_AMBULATORY_CARE_PROVIDER_SITE_OTHER): Payer: 59 | Admitting: Family Medicine

## 2019-09-02 DIAGNOSIS — I10 Essential (primary) hypertension: Secondary | ICD-10-CM | POA: Diagnosis not present

## 2019-09-02 DIAGNOSIS — J45909 Unspecified asthma, uncomplicated: Secondary | ICD-10-CM

## 2019-09-02 DIAGNOSIS — R05 Cough: Secondary | ICD-10-CM

## 2019-09-02 DIAGNOSIS — U071 COVID-19: Secondary | ICD-10-CM

## 2019-09-02 DIAGNOSIS — R059 Cough, unspecified: Secondary | ICD-10-CM

## 2019-09-02 MED ORDER — BUDESONIDE 180 MCG/ACT IN AEPB: 2.0000 | INHALATION_SPRAY | Freq: Two times a day (BID) | RESPIRATORY_TRACT | 3 refills | Status: DC

## 2019-09-02 MED ORDER — HYDROCODONE-HOMATROPINE 5-1.5 MG/5ML PO SYRP: 5.0000 mL | ORAL_SOLUTION | Freq: Three times a day (TID) | ORAL | 0 refills | Status: DC | PRN

## 2019-09-02 MED ORDER — ALBUTEROL SULFATE HFA 108 (90 BASE) MCG/ACT IN AERS: INHALATION_SPRAY | RESPIRATORY_TRACT | 3 refills | Status: DC

## 2019-09-02 NOTE — Telephone Encounter (Signed)
Patient seen today

## 2019-09-02 NOTE — Assessment & Plan Note (Addendum)
Patient first developed symptoms a week ago today started with headaches chills and fatigue.  By the weekend she had developed cough, headaches, myalgias and pain with coughing as well as diarrhea nausea and poor appetite.  She went to [facility] and had a Covid test left on Sunday 2 days ago which was positive she is a known asthmatic and she has been having increasing cough but she still denies shortness of breath.  She is out of her Pulmicort and albuterol so those were refilled today.  She is given a prescription for Hydromet and encouraged to take zinc, multivitamin with minerals including selenium, fish oil, elderberry and to increase her hydration and rest.  She is referred for possible infusion to treat her Covid due to her age and comorbidities. Her care takes 25 minutes due to coordinate and document. She is unvaccinated

## 2019-09-02 NOTE — Assessment & Plan Note (Signed)
She had been doing well and was not using her Pulmicort and Albuterol so those are refilled today

## 2019-09-02 NOTE — Progress Notes (Signed)
Virtual Visit via Video Note  I connected with Kathleen Church on 09/02/19 at  3:20 PM EDT by a video enabled telemedicine application and verified that I am speaking with the correct person using two identifiers.  Location: Patient: home, patient and provider in visit Provider: work   I discussed the limitations of evaluation and management by telemedicine and the availability of in person appointments. The patient expressed understanding and agreed to proceed. Thelma Barge, CMA was able to get the patient set up on a video visit   Subjective:    Patient ID: Kathleen Church, female    DOB: [birth year], 59 y.o.   MRN: [id number]  No chief complaint on file.   HPI Patient is in today for evaluation of COVID diagnosis. Patient first developed symptoms a week ago today started with headaches chills and fatigue.  By the weekend she had developed cough, headaches, myalgias and pain with coughing as well as diarrhea nausea and poor appetite.  [facility] and had a Covid test left on Sunday 2 days ago which was positive she is a known asthmatic and she has been having increasing cough but she still denies shortness of breath.  She is out of her Pulmicort and albuterol.  Past Medical History:  Diagnosis Date  . Allergic state   . Anxiety    when father passed  . Arthritis   . Asthma, mild persistent    allergy induced asthma   . Cervical cancer screening 04/27/2011  . Edema 04/27/2011  . Heart murmur    as an infant   . Hyperlipidemia 04/27/2011  . Hypertension 2010  . Hypokalemia 04/27/2011  . Measles as a child  . Mumps as a child  . Muscle cramp 10/14/2015  . Obesity   . Osteoarthritis of left knee 10/14/2015  . Plantar fasciitis, left 01/19/2013  . Preventative health care 02/13/2011  . Sinusitis 02/13/2011    Past Surgical History:  Procedure Laterality Date  . ENDOMETRIAL ABLATION  2006   menorraghia  . KNEE SURGERY  2006   left knee  . TONSILLECTOMY    . TOTAL KNEE  ARTHROPLASTY Left 10/15/2015   Procedure: LEFT TOTAL KNEE ARTHROPLASTY;  Surgeon: Eugenia Mcalpine, MD;  Location: [facility];  Service: Orthopedics;  Laterality: Left;  . TUBAL LIGATION      Family History  Problem Relation Age of Onset  . Cancer Father        kidney  . Cancer Maternal Grandmother        bone  . Factor V Leiden deficiency Maternal Grandmother   . Cancer Paternal Grandmother        breast  . Breast cancer Paternal Grandmother   . Asthma Daughter   . Heart attack Paternal Grandfather   . Cancer Mother 49       metastatic lung cancer  . Stroke Mother   . Asthma Daughter   . Factor V Leiden deficiency Maternal Aunt     Social History   Socioeconomic History  . Marital status: Widowed    Spouse name: Not on file  . Number of children: Not on file  . Years of education: Not on file  . Highest education level: Not on file  Occupational History  . Not on file  Tobacco Use  . Smoking status: Never Smoker  . Smokeless tobacco: Never Used  Substance and Sexual Activity  . Alcohol use: Yes    Comment: occas   . Drug use: No  .  Sexual activity: Yes    Partners: Male  Other Topics Concern  . Not on file  Social History Narrative  . Not on file   Social Determinants of Health   Financial Resource Strain:   . Difficulty of Paying Living Expenses: Not on file  Food Insecurity:   . Worried About Programme researcher, broadcasting/film/video in the Last Year: Not on file  . Ran Out of Food in the Last Year: Not on file  Transportation Needs:   . Lack of Transportation (Medical): Not on file  . Lack of Transportation (Non-Medical): Not on file  Physical Activity:   . Days of Exercise per Week: Not on file  . Minutes of Exercise per Session: Not on file  Stress:   . Feeling of Stress : Not on file  Social Connections:   . Frequency of Communication with Friends and Family: Not on file  . Frequency of Social Gatherings with Friends and Family: Not on file  . Attends Religious Services:  Not on file  . Active Member of Clubs or Organizations: Not on file  . Attends Banker Meetings: Not on file  . Marital Status: Not on file  Intimate Partner Violence:   . Fear of Current or Ex-Partner: Not on file  . Emotionally Abused: Not on file  . Physically Abused: Not on file  . Sexually Abused: Not on file    Outpatient Medications Prior to Visit  Medication Sig Dispense Refill  . ALPRAZolam (XANAX) 0.25 MG tablet Take 1 tablet (0.25 mg total) by mouth 2 (two) times daily as needed for anxiety. 5 tablet 0  . aspirin EC 325 MG tablet Take 1 tablet (325 mg total) by mouth 2 (two) times daily. 60 tablet 0  . fenofibrate (TRICOR) 145 MG tablet TAKE 1 TABLET BY MOUTH EVERY DAY 90 tablet 1  . fluticasone (FLONASE) 50 MCG/ACT nasal spray Place 2 sprays into both nostrils daily. 16 g 1  . levocetirizine (XYZAL) 5 MG tablet Take 1 tablet (5 mg total) by mouth every evening. 30 tablet 5  . metoprolol succinate (TOPROL-XL) 50 MG 24 hr tablet TAKE 1 TABLET (50 MG TOTAL) BY MOUTH DAILY. TAKE WITH OR IMMEDIATELY FOLLOWING A MEAL. 90 tablet 1  . montelukast (SINGULAIR) 10 MG tablet TAKE 1 TABLET BY MOUTH EVERYDAY AT BEDTIME 90 tablet 1  . valsartan-hydrochlorothiazide (DIOVAN-HCT) 160-12.5 MG tablet TAKE 1 TABLET BY MOUTH EVERY DAY 90 tablet 1  . budesonide (PULMICORT) 180 MCG/ACT inhaler Inhale 2 puffs into the lungs 2 (two) times daily. 3 Inhaler 11  . PROAIR HFA 108 (90 Base) MCG/ACT inhaler TAKE 2 PUFFS BY MOUTH EVERY 6 HOURS AS NEEDED FOR WHEEZE OR SHORTNESS OF BREATH 8.5 Inhaler 2   No facility-administered medications prior to visit.    Allergies  Allergen Reactions  . Codeine Palpitations and Rash    Review of Systems  Constitutional: Positive for chills and malaise/fatigue. Negative for fever.  HENT: Positive for congestion.   Eyes: Negative for blurred vision.  Respiratory: Positive for cough, sputum production and shortness of breath.   Cardiovascular: Positive  for chest pain. Negative for palpitations and leg swelling.  Gastrointestinal: Negative for abdominal pain, blood in stool and nausea.  Genitourinary: Negative for dysuria and frequency.  Musculoskeletal: Positive for myalgias. Negative for falls.  Skin: Negative for rash.  Neurological: Positive for headaches. Negative for dizziness and loss of consciousness.  Endo/Heme/Allergies: Negative for environmental allergies.  Psychiatric/Behavioral: Negative for depression. The patient is not  nervous/anxious.        Objective:    Physical Exam Constitutional:      General: She is not in acute distress.    Appearance: Normal appearance. She is obese. She is not ill-appearing.  HENT:     Head: Normocephalic and atraumatic.     Right Ear: External ear normal.     Left Ear: External ear normal.     Nose: Nose normal.  Eyes:     General:        Right eye: No discharge.        Left eye: No discharge.  Pulmonary:     Effort: Pulmonary effort is normal.  Neurological:     Mental Status: She is alert and oriented to person, place, and time.  Psychiatric:        Behavior: Behavior normal.     Pulse 86   SpO2 94%  Wt Readings from Last 3 Encounters:  12/02/18 247 lb 6.4 oz (112.2 kg)  11/27/17 244 lb 6.4 oz (110.9 kg)  05/17/17 247 lb 6.4 oz (112.2 kg)    Diabetic Foot Exam - Simple   No data filed     Lab Results  Component Value Date   WBC 8.8 12/02/2018   HGB 13.3 12/02/2018   HCT 40.4 12/02/2018   PLT 419.0 (H) 12/02/2018   GLUCOSE 79 12/02/2018   CHOL 167 12/02/2018   TRIG 109.0 12/02/2018   HDL 45.60 12/02/2018   LDLDIRECT 133.4 02/08/2011   LDLCALC 100 (H) 12/02/2018   ALT 26 12/02/2018   AST 20 12/02/2018   NA 141 12/02/2018   K 3.9 12/02/2018   CL 102 12/02/2018   CREATININE 0.63 12/02/2018   BUN 18 12/02/2018   CO2 29 12/02/2018   TSH 1.24 12/02/2018   INR 1.03 10/08/2015   HGBA1C 4.9 12/02/2018    Lab Results  Component Value Date   TSH 1.24  12/02/2018   Lab Results  Component Value Date   WBC 8.8 12/02/2018   HGB 13.3 12/02/2018   HCT 40.4 12/02/2018   MCV 93.3 12/02/2018   PLT 419.0 (H) 12/02/2018   Lab Results  Component Value Date   NA 141 12/02/2018   K 3.9 12/02/2018   CO2 29 12/02/2018   GLUCOSE 79 12/02/2018   BUN 18 12/02/2018   CREATININE 0.63 12/02/2018   BILITOT 0.6 12/02/2018   ALKPHOS 62 12/02/2018   AST 20 12/02/2018   ALT 26 12/02/2018   PROT 7.0 12/02/2018   ALBUMIN 3.9 12/02/2018   CALCIUM 9.5 12/02/2018   ANIONGAP 7 10/16/2015   GFR 96.51 12/02/2018   Lab Results  Component Value Date   CHOL 167 12/02/2018   Lab Results  Component Value Date   HDL 45.60 12/02/2018   Lab Results  Component Value Date   LDLCALC 100 (H) 12/02/2018   Lab Results  Component Value Date   TRIG 109.0 12/02/2018   Lab Results  Component Value Date   CHOLHDL 4 12/02/2018   Lab Results  Component Value Date   HGBA1C 4.9 12/02/2018       Assessment & Plan:   Problem List Items Addressed This Visit    Hypertension    Monitor and report any concerns, No changes to meds. Encouraged heart healthy diet such as the DASH diet and exercise as tolerated      Asthma    She had been doing well and was not using her Pulmicort and Albuterol so those are refilled today  Relevant Medications   albuterol (PROAIR HFA) 108 (90 Base) MCG/ACT inhaler   budesonide (PULMICORT) 180 MCG/ACT inhaler   COVID-19    Patient first developed symptoms a week ago today started with headaches chills and fatigue.  By the weekend she had developed cough, headaches, myalgias and pain with coughing as well as diarrhea nausea and poor appetite.  [facility] Cov[facility] test left on Sunday 2 days ago which was positive she is a known asthmatic and she has been having increasing cough but she still denies shortness of breath.  She is out of her Pulmicort and albuterol so those were refilled today.  She is given a  prescription for Hydromet and encouraged to take zinc, multivitamin with minerals including selenium, fish oil, elderberry and to increase her hydration and rest.  She is referred for possible infusion to treat her Covid due to her age and comorbidities. Her care takes 25 minutes due to coordinate and document. She is unvaccinated       Other Visit Diagnoses    Cough       Relevant Medications   albuterol (PROAIR HFA) 108 (90 Base) MCG/ACT inhaler      I have changed Tammi Sou. Brining's ProAir HFA to albuterol. I am also having her start on HYDROcodone-homatropine. Additionally, I am having her maintain her aspirin EC, levocetirizine, fluticasone, ALPRAZolam, fenofibrate, montelukast, metoprolol succinate, valsartan-hydrochlorothiazide, and budesonide.  Meds ordered this encounter  Medications  . albuterol (PROAIR HFA) 108 (90 Base) MCG/ACT inhaler    Sig: TAKE 2 PUFFS BY MOUTH EVERY 6 HOURS AS NEEDED FOR WHEEZE OR SHORTNESS OF BREATH    Dispense:  18 g    Refill:  3  . budesonide (PULMICORT) 180 MCG/ACT inhaler    Sig: Inhale 2 puffs into the lungs 2 (two) times daily.    Dispense:  1 each    Refill:  3  . HYDROcodone-homatropine (HYCODAN) 5-1.5 MG/5ML syrup    Sig: Take 5 mLs by mouth every 8 (eight) hours as needed for cough.    Dispense:  150 mL    Refill:  0      I discussed the assessment and treatment plan with the patient. The patient was provided an opportunity to ask questions and all were answered. The patient agreed with the plan and demonstrated an understanding of the instructions.   The patient was advised to call back or seek an in-person evaluation if the symptoms worsen or if the condition fails to improve as anticipated.  I provided 25 minutes of non-face-to-face time during this encounter.   Danise Edge, MD

## 2019-09-02 NOTE — Telephone Encounter (Signed)
CallerApolonio Schneiders  Call Back # [phone]  Patient states dx with Covid -19 and would like provider to recommend medication. Patient states Cough.

## 2019-09-02 NOTE — Assessment & Plan Note (Signed)
Monitor and report any concerns, No changes to meds. Encouraged heart healthy diet such as the DASH diet and exercise as tolerated

## 2019-09-02 NOTE — Telephone Encounter (Signed)
See if we can get her on with video visit this afternoon or tomorrow morning. I have a 5-6 oclock meeting but could still squeeze her in. Also find out how we currently refer for infusions if she is symptomatic.

## 2019-09-02 NOTE — Progress Notes (Signed)
I connected by phone with Kathleen Church on 09/02/2019 at 6:38 PM to discuss the potential use of a new treatment for mild to moderate COVID-19 viral infection in non-hospitalized patients.  This patient is a 59 y.o. female that meets the FDA criteria for Emergency Use Authorization of COVID monoclonal antibody casirivimab/imdevimab.  Has a (+) direct SARS-CoV-2 viral test result  Has mild or moderate COVID-19   Is NOT hospitalized due to COVID-19 (Sx onset 08/26/19).   Is within 10 days of symptom onset  Has at least one of the high risk factor(s) for progression to severe COVID-19 and/or hospitalization as defined in EUA.  Specific high risk criteria : Cardiovascular disease or hypertension   I have spoken and communicated the following to the patient or parent/caregiver regarding COVID monoclonal antibody treatment:  1. FDA has authorized the emergency use for the treatment of mild to moderate COVID-19 in adults and pediatric patients with positive results of direct SARS-CoV-2 viral testing who are 2 years of age and older weighing at least 40 kg, and who are at high risk for progressing to severe COVID-19 and/or hospitalization.  2. The significant known and potential risks and benefits of COVID monoclonal antibody, and the extent to which such potential risks and benefits are unknown.  3. Information on available alternative treatments and the risks and benefits of those alternatives, including clinical trials.  4. Patients treated with COVID monoclonal antibody should continue to self-isolate and use infection control measures (e.g., wear mask, isolate, social distance, avoid sharing personal items, clean and disinfect "high touch" surfaces, and frequent handwashing) according to CDC guidelines.   5. The patient or parent/caregiver has the option to accept or refuse COVID monoclonal antibody treatment.  After reviewing this information with the patient, The patient agreed to  proceed with receiving casirivimab\imdevimab infusion and will be provided a copy of the Fact sheet prior to receiving the infusion. [name] 09/02/2019 6:38 PM

## 2019-09-03 ENCOUNTER — Ambulatory Visit (HOSPITAL_COMMUNITY)
Admission: RE | Admit: 2019-09-03 | Discharge: 2019-09-03 | Disposition: A | Discharge status: Home / Self Care | Payer: 59 | Source: Ambulatory Visit | Attending: Pulmonary Disease | Admitting: Pulmonary Disease

## 2019-09-03 DIAGNOSIS — U071 COVID-19: Secondary | ICD-10-CM

## 2019-09-03 DIAGNOSIS — I10 Essential (primary) hypertension: Secondary | ICD-10-CM

## 2019-09-03 MED ORDER — EPINEPHRINE 0.3 MG/0.3ML IJ SOAJ: 0.3000 mg | Freq: Once | INTRAMUSCULAR | Status: DC | PRN

## 2019-09-03 MED ORDER — SODIUM CHLORIDE 0.9 % IV SOLN: INTRAVENOUS | Status: DC | PRN

## 2019-09-03 MED ORDER — FAMOTIDINE IN NACL 20-0.9 MG/50ML-% IV SOLN: 20.0000 mg | Freq: Once | INTRAVENOUS | Status: DC | PRN

## 2019-09-03 MED ORDER — SODIUM CHLORIDE 0.9 % IV SOLN
1200.0000 mg | Freq: Once | INTRAVENOUS | Status: AC
  Administered 2019-09-03: 1200 mg via INTRAVENOUS
  Filled 2019-09-03: qty 10

## 2019-09-03 MED ORDER — DIPHENHYDRAMINE HCL 50 MG/ML IJ SOLN: 50.0000 mg | Freq: Once | INTRAMUSCULAR | Status: DC | PRN

## 2019-09-03 MED ORDER — ALBUTEROL SULFATE HFA 108 (90 BASE) MCG/ACT IN AERS: 2.0000 | INHALATION_SPRAY | Freq: Once | RESPIRATORY_TRACT | Status: DC | PRN

## 2019-09-03 MED ORDER — METHYLPREDNISOLONE SODIUM SUCC 125 MG IJ SOLR: 125.0000 mg | Freq: Once | INTRAMUSCULAR | Status: DC | PRN

## 2019-09-03 NOTE — Discharge Instructions (Signed)

## 2019-09-03 NOTE — Progress Notes (Signed)
  Diagnosis: COVID-19  Physician: Dr. Patrick Wright  Procedure: Covid Infusion Clinic Med: casirivimab\imdevimab infusion - Provided patient with casirivimab\imdevimab fact sheet for patients, parents and caregivers prior to infusion.  Complications: No immediate complications noted.  Discharge: Discharged home   Khari Lett 09/03/2019   

## 2019-09-04 ENCOUNTER — Telehealth (HOSPITAL_COMMUNITY): Payer: Self-pay | Admitting: Oncology

## 2019-09-08 ENCOUNTER — Telehealth: Payer: Self-pay | Admitting: Family Medicine

## 2019-09-08 NOTE — Telephone Encounter (Signed)
Holy wanted to inform Dr. Abner Greenspan that her mother was admitted due to Covid.  She is not doing well and may have to be placed on a ventilator if she gets worst today.

## 2019-09-24 ENCOUNTER — Other Ambulatory Visit: Payer: Self-pay

## 2019-09-24 ENCOUNTER — Ambulatory Visit: Payer: 59 | Admitting: Medical

## 2019-09-24 ENCOUNTER — Ambulatory Visit (HOSPITAL_BASED_OUTPATIENT_CLINIC_OR_DEPARTMENT_OTHER)
Admission: RE | Admit: 2019-09-24 | Discharge: 2019-09-24 | Disposition: A | Discharge status: Home / Self Care | Payer: 59 | Source: Ambulatory Visit | Attending: Medical | Admitting: Medical

## 2019-09-24 ENCOUNTER — Encounter: Payer: Self-pay | Admitting: Medical

## 2019-09-24 VITALS — BP 122/77 | HR 80 | Resp 20 | Ht 68.0 in | Wt 234.8 lb

## 2019-09-24 DIAGNOSIS — J45909 Unspecified asthma, uncomplicated: Secondary | ICD-10-CM | POA: Diagnosis present

## 2019-09-24 DIAGNOSIS — E876 Hypokalemia: Secondary | ICD-10-CM

## 2019-09-24 DIAGNOSIS — R062 Wheezing: Secondary | ICD-10-CM | POA: Insufficient documentation

## 2019-09-24 DIAGNOSIS — L089 Local infection of the skin and subcutaneous tissue, unspecified: Secondary | ICD-10-CM

## 2019-09-24 DIAGNOSIS — Z8709 Personal history of other diseases of the respiratory system: Secondary | ICD-10-CM

## 2019-09-24 DIAGNOSIS — I10 Essential (primary) hypertension: Secondary | ICD-10-CM | POA: Diagnosis not present

## 2019-09-24 MED ORDER — HYDROCODONE-HOMATROPINE 5-1.5 MG/5ML PO SYRP
5.0000 mL | ORAL_SOLUTION | Freq: Three times a day (TID) | ORAL | 0 refills | Status: DC | PRN
Start: 2019-09-24 — End: 2019-10-07

## 2019-09-24 MED ORDER — METHYLPREDNISOLONE 4 MG PO TABS: ORAL_TABLET | ORAL | 0 refills | Status: DC

## 2019-09-24 MED ORDER — DOXYCYCLINE HYCLATE 100 MG PO TABS: 100.0000 mg | ORAL_TABLET | Freq: Two times a day (BID) | ORAL | 0 refills | Status: DC

## 2019-09-24 NOTE — Addendum Note (Signed)
Addended by: Mervin Kung A on: 09/24/2019 02:24 PM   Modules accepted: Orders

## 2019-09-24 NOTE — Progress Notes (Signed)
Subjective:    Patient ID: Kathleen Church, female    DOB: 08/28/1960, 59 y.o.   MRN: [id number]  HPI  Pt in for follow up.   Pt dx August 31, 2019. Had symptoms since 40 th and 17th.   Pt has 02 sat monitor. Resting 94-96%. With activity 92-93%.  Pt cough day and night. Trouble falling to sleep due to cough.  Pt had pneumonia. Was on high flow oxygen when in hospital. She was never placed on vent.  Pt was dc after about 3 weeks. Given antibiotics for covid pneumonia. Pt discharged on oxygen 3 L. Pt cough is dry.  Pt has pulmicort inhaler and albuterol available as well.  Pt was on steroid while in hospital. Before dc was on prednisone.   Pt does not have appointment with pulmonologist.  Pt has hx of atril fibrillation. Pt is on xarelto. On b blocker. Has appointment with cardiologist in one week. No hx of chf. Atrial fibrillation.  One day of red, swollen, tender pretibial area.  Hospital dx -ards with hypoxia, covid 19 pneumonia.  Review of Systems  Constitutional: Positive for fatigue. Negative for chills and fever.  Respiratory: Positive for cough and shortness of breath. Negative for chest tightness and wheezing.        Improved but still having some with activity.  Musculoskeletal: Negative for back pain, myalgias and neck pain.  Skin: Negative for rash.  Neurological: Negative for dizziness, speech difficulty, weakness and headaches.       Feel like has covid fog.  Hematological: Negative for adenopathy. Does not bruise/bleed easily.  Psychiatric/Behavioral: The patient is not nervous/anxious.     Past Medical History:  Diagnosis Date   Allergic state    Anxiety    when father passed   Arthritis    Asthma, mild persistent    allergy induced asthma    Cervical cancer screening 04/27/2011   Edema 04/27/2011   Heart murmur    as an infant    Hyperlipidemia 04/27/2011   Hypertension 2010   Hypokalemia 04/27/2011   Measles as a child   Mumps as a  child   Muscle cramp 10/14/2015   Obesity    Osteoarthritis of left knee 10/14/2015   Plantar fasciitis, left 01/19/2013   Preventative health care 02/13/2011   Sinusitis 02/13/2011     Social History   Socioeconomic History   Marital status: Widowed    Spouse name: Not on file   Number of children: Not on file   Years of education: Not on file   Highest education level: Not on file  Occupational History   Not on file  Tobacco Use   Smoking status: Never Smoker   Smokeless tobacco: Never Used  Substance and Sexual Activity   Alcohol use: Yes    Comment: occas    Drug use: No   Sexual activity: Yes    Partners: Male  Other Topics Concern   Not on file  Social History Narrative   Not on file   Social Determinants of Health   Financial Resource Strain:    Difficulty of Paying Living Expenses: Not on file  Food Insecurity:    Worried About Running Out of Food in the Last Year: Not on file   Ran Out of Food in the Last Year: Not on file  Transportation Needs:    Lack of Transportation (Medical): Not on file   Lack of Transportation (Non-Medical): Not on file  Physical Activity:  Days of Exercise per Week: Not on file   Minutes of Exercise per Session: Not on file  Stress:    Feeling of Stress : Not on file  Social Connections:    Frequency of Communication with Friends and Family: Not on file   Frequency of Social Gatherings with Friends and Family: Not on file   Attends Religious Services: Not on file   Active Member of Clubs or Organizations: Not on file   Attends Banker Meetings: Not on file   Marital Status: Not on file  Intimate Partner Violence:    Fear of Current or Ex-Partner: Not on file   Emotionally Abused: Not on file   Physically Abused: Not on file   Sexually Abused: Not on file    Past Surgical History:  Procedure Laterality Date   ENDOMETRIAL ABLATION  2006   menorraghia   KNEE SURGERY  2006    left knee   TONSILLECTOMY     TOTAL KNEE ARTHROPLASTY Left 10/15/2015   Procedure: LEFT TOTAL KNEE ARTHROPLASTY;  Surgeon: Eugenia Mcalpine, MD;  Location: [facility];  Service: Orthopedics;  Laterality: Left;   TUBAL LIGATION      Family History  Problem Relation Age of Onset   Cancer Father        kidney   Cancer Maternal Grandmother        bone   Factor V Leiden deficiency Maternal Grandmother    Cancer Paternal Grandmother        breast   Breast cancer Paternal Grandmother    Asthma Daughter    Heart attack Paternal Grandfather    Cancer Mother 54       metastatic lung cancer   Stroke Mother    Asthma Daughter    Factor V Leiden deficiency Maternal Aunt     Allergies  Allergen Reactions   Codeine Palpitations and Rash    Current Outpatient Medications on File Prior to Visit  Medication Sig Dispense Refill   rivaroxaban (XARELTO) 20 MG TABS tablet Take by mouth.     albuterol (PROAIR HFA) 108 (90 Base) MCG/ACT inhaler TAKE 2 PUFFS BY MOUTH EVERY 6 HOURS AS NEEDED FOR WHEEZE OR SHORTNESS OF BREATH 18 g 3   ALPRAZolam (XANAX) 0.25 MG tablet Take 1 tablet (0.25 mg total) by mouth 2 (two) times daily as needed for anxiety. 5 tablet 0   aspirin EC 325 MG tablet Take 1 tablet (325 mg total) by mouth 2 (two) times daily. 60 tablet 0   budesonide (PULMICORT) 180 MCG/ACT inhaler Inhale 2 puffs into the lungs 2 (two) times daily. 1 each 3   fenofibrate (TRICOR) 145 MG tablet TAKE 1 TABLET BY MOUTH EVERY DAY 90 tablet 1   fluticasone (FLONASE) 50 MCG/ACT nasal spray Place 2 sprays into both nostrils daily. 16 g 1   HYDROcodone-homatropine (HYCODAN) 5-1.5 MG/5ML syrup Take 5 mLs by mouth every 8 (eight) hours as needed for cough. 150 mL 0   levocetirizine (XYZAL) 5 MG tablet Take 1 tablet (5 mg total) by mouth every evening. 30 tablet 5   metoprolol succinate (TOPROL-XL) 50 MG 24 hr tablet TAKE 1 TABLET (50 MG TOTAL) BY MOUTH DAILY. TAKE WITH OR IMMEDIATELY  FOLLOWING A MEAL. 90 tablet 1   montelukast (SINGULAIR) 10 MG tablet TAKE 1 TABLET BY MOUTH EVERYDAY AT BEDTIME 90 tablet 1   valsartan-hydrochlorothiazide (DIOVAN-HCT) 160-12.5 MG tablet TAKE 1 TABLET BY MOUTH EVERY DAY 90 tablet 1   No current facility-administered medications on  file prior to visit.    BP 122/77    Pulse 80    Resp 20    Ht 5\' 8"  (1.727 m)    Wt 234 lb 12.8 oz (106.5 kg)    SpO2 98%    BMI 35.70 kg/m       Objective:   Physical Exam   General Mental Status- Alert. General Appearance- Not in acute distress.   Skin General: Color- Normal Color. Moisture- Normal Moisture.  Neck Carotid Arteries- Normal color. Moisture- Normal Moisture. No carotid bruits. No JVD.  Chest and Lung Exam Auscultation: Breath Sounds:-Normal.  Cardiovascular Auscultation:Rythm- Regular. Murmurs & Other Heart Sounds:Auscultation of the heart reveals- No Murmurs.  Abdomen Inspection:-Inspeection Normal. Palpation/Percussion:Note:No mass. Palpation and Percussion of the abdomen reveal- Non Tender, Non Distended + BS, no rebound or guarding.    Neurologic Cranial Nerve exam:- CN III-XII intact(No nystagmus), symmetric smile. Strength:- 5/5 equal and symmetric strength both upper and lower extremities.   Lower ext- distal 1/3 tibia 12 cm x 4 cm approx red, slight warm area with moderate tenderness. Negative homans sign.    Assessment & Plan:  You have history of Covid pneumonia and ARDS with hypoxia.  Hospitalized for about 3 weeks and discharged on oxygen.  Presently oxygen today at rest was 98% with a 3 L.  Sometimes with activity you report oxygen decreasing to 92 to 93% level.  Stay on oxygen 3 L.  Will get chest x-ray today.  History of mild asthma before the above.  Recommend that you stay on Pulmicort daily and use albuterol as needed.  I do think you would benefit from short 6-day taper course of Medrol.  Went ahead and place referral to pulmonologist.  Asking that  they see you within 7 to 10 days.  Placed internal or external referral.  Goal is to get you in the soonest possible.  History of atrial fibrillation.  Rate controlled presently.  Follow-up with cardiologist next week.  Stay on current meds.  Appear to have right lower extremity skin infection.  Prescribing doxycycline antibiotic.  If the red area expands or worsens then return to clinic.  Follow-up in 7 days or as needed.  We will repeat CMP and CBC today. Follow CMP due to low potassium during hospitalization.  , PA-C   Time spent with patient today was 44  minutes which consisted of chart review, discussing diagnoses, work up, treatment, placing referral and documentation.

## 2019-09-24 NOTE — Patient Instructions (Signed)
You have history of Covid pneumonia and ARDS with hypoxia.  Hospitalized for about 3 weeks and discharged on oxygen.  Presently oxygen today at rest was 98% with a 3 L.  Sometimes with activity you report oxygen decreasing to 92 to 93% level.  Stay on oxygen 3 L.  Will get chest x-ray today.  History of mild asthma before the above.  Recommend that you stay on Pulmicort daily and use albuterol as needed.  I do think you would benefit from short 6-day taper course of Medrol.  Went ahead and place referral to pulmonologist.  Asking that they see you within 7 to 10 days.  Placed internal or external referral.  Goal is to get you in the soonest possible.  History of atrial fibrillation.  Rate controlled presently.  Follow-up with cardiologist next week.  Stay on current meds.  Appear to have right lower extremity skin infection.  Prescribing doxycycline antibiotic.  If the red area expands or worsens then return to clinic.  Follow-up in 7 days or as needed.  We will repeat CMP and CBC today. Follow CMP due to low potassium during hospitalization.

## 2019-09-25 LAB — COMPREHENSIVE METABOLIC PANEL
AG Ratio: 1.3 (calc) (ref 1.0–2.5)
ALT: 46 U/L — ABNORMAL HIGH (ref 6–29)
AST: 25 U/L (ref 10–35)
Albumin: 3.4 g/dL — ABNORMAL LOW (ref 3.6–5.1)
Alkaline phosphatase (APISO): 47 U/L (ref 37–153)
BUN: 13 mg/dL (ref 7–25)
CO2: 29 mmol/L (ref 20–32)
Calcium: 9.3 mg/dL (ref 8.6–10.4)
Chloride: 102 mmol/L (ref 98–110)
Creat: 0.57 mg/dL (ref 0.50–0.99)
Globulin: 2.6 g/dL (calc) (ref 1.9–3.7)
Glucose, Bld: 109 mg/dL — ABNORMAL HIGH (ref 65–99)
Potassium: 3.8 mmol/L (ref 3.5–5.3)
Sodium: 141 mmol/L (ref 135–146)
Total Bilirubin: 0.8 mg/dL (ref 0.2–1.2)
Total Protein: 6 g/dL — ABNORMAL LOW (ref 6.1–8.1)

## 2019-09-25 LAB — CBC WITH DIFFERENTIAL/PLATELET
Absolute Monocytes: 1253 cells/uL — ABNORMAL HIGH (ref 200–950)
Basophils Absolute: 58 cells/uL (ref 0–200)
Basophils Relative: 0.7 %
Eosinophils Absolute: 382 cells/uL (ref 15–500)
Eosinophils Relative: 4.6 %
HCT: 40 % (ref 35.0–45.0)
Hemoglobin: 13.1 g/dL (ref 11.7–15.5)
Lymphs Abs: 1154 cells/uL (ref 850–3900)
MCH: 30.8 pg (ref 27.0–33.0)
MCHC: 32.8 g/dL (ref 32.0–36.0)
MCV: 94.1 fL (ref 80.0–100.0)
MPV: 10.1 fL (ref 7.5–12.5)
Monocytes Relative: 15.1 %
Neutro Abs: 5453 cells/uL (ref 1500–7800)
Neutrophils Relative %: 65.7 %
Platelets: 352 10*3/uL (ref 140–400)
RBC: 4.25 10*6/uL (ref 3.80–5.10)
RDW: 13.1 % (ref 11.0–15.0)
Total Lymphocyte: 13.9 %
WBC: 8.3 10*3/uL (ref 3.8–10.8)

## 2019-09-26 ENCOUNTER — Telehealth: Payer: Self-pay | Admitting: Family Medicine

## 2019-09-26 DIAGNOSIS — I480 Paroxysmal atrial fibrillation: Secondary | ICD-10-CM | POA: Insufficient documentation

## 2019-09-26 NOTE — Telephone Encounter (Signed)
Caller : Eric Call Back # [phone]  Requesting home health  OT  For week 1 x1  Week 2x3

## 2019-09-26 NOTE — Telephone Encounter (Signed)
Please give VO as requested

## 2019-09-26 NOTE — Telephone Encounter (Signed)
Verbal Order Request-Home Health OT

## 2019-09-28 ENCOUNTER — Other Ambulatory Visit: Payer: Self-pay | Admitting: Family Medicine

## 2019-09-29 NOTE — Telephone Encounter (Signed)
Verbal Order for Home Health OT has been confirmed to [name] with Physical /Occupational Therapy, via Provider.  Copy will be faxed and to be signed and faxed back.

## 2019-10-01 ENCOUNTER — Ambulatory Visit: Payer: 59 | Admitting: Medical

## 2019-10-01 ENCOUNTER — Other Ambulatory Visit: Payer: Self-pay

## 2019-10-01 VITALS — BP 119/71 | HR 106 | Resp 16 | Ht 68.0 in | Wt 234.0 lb

## 2019-10-01 DIAGNOSIS — R05 Cough: Secondary | ICD-10-CM

## 2019-10-01 DIAGNOSIS — I10 Essential (primary) hypertension: Secondary | ICD-10-CM

## 2019-10-01 DIAGNOSIS — R5381 Other malaise: Secondary | ICD-10-CM

## 2019-10-01 DIAGNOSIS — Z8616 Personal history of COVID-19: Secondary | ICD-10-CM | POA: Diagnosis not present

## 2019-10-01 DIAGNOSIS — R0600 Dyspnea, unspecified: Secondary | ICD-10-CM

## 2019-10-01 DIAGNOSIS — R059 Cough, unspecified: Secondary | ICD-10-CM

## 2019-10-01 NOTE — Progress Notes (Signed)
Subjective:    Patient ID: Kathleen Church, female    DOB: 03/19/1960, 59 y.o.   MRN: [id number]  HPI  Pt in for follow up.  Pt did get in with pulmonologist. Pt states told to continue pulmicort. Set her up for sleep study and pft.   Pt wants has appointment with pulmonologist  Oct 20, 2019.  Pt will get pft on Oct 15, 2019.  Pt on Oct 22, 2019 may get ablation procedure for atrial fibrillation.  Pt wants to consider oxygen concentor.(pt daugher has talked with [employer]) currently on 3 liters o2 and carrying around oyxgen tank difficult.  Pt finished prednisone.   Pt on 3 liters. Without will drop below 90% quickly.   Pt currently has PT and OT home health. They area not doing much per pt.   Pt has questions on disability paperwork. I have not seen any.     Review of Systems  Constitutional: Negative for chills, fatigue and fever.  Respiratory: Positive for shortness of breath. Negative for cough, chest tightness and wheezing.   Cardiovascular: Negative for chest pain and palpitations.  Gastrointestinal: Negative for abdominal pain and anal bleeding.  Genitourinary: Negative for difficulty urinating and dysuria.  Musculoskeletal: Negative for back pain.  Skin: Negative for rash.  Hematological: Negative for adenopathy. Does not bruise/bleed easily.  Psychiatric/Behavioral: Negative for behavioral problems. The patient is not nervous/anxious.     Past Medical History:  Diagnosis Date  . Allergic state   . Anxiety    when father passed  . Arthritis   . Asthma, mild persistent    allergy induced asthma   . Cervical cancer screening 04/27/2011  . Edema 04/27/2011  . Heart murmur    as an infant   . Hyperlipidemia 04/27/2011  . Hypertension 2010  . Hypokalemia 04/27/2011  . Measles as a child  . Mumps as a child  . Muscle cramp 10/14/2015  . Obesity   . Osteoarthritis of left knee 10/14/2015  . Plantar fasciitis, left 01/19/2013  . Preventative health care 02/13/2011    . Sinusitis 02/13/2011     Social History   Socioeconomic History  . Marital status: Widowed    Spouse name: Not on file  . Number of children: Not on file  . Years of education: Not on file  . Highest education level: Not on file  Occupational History  . Not on file  Tobacco Use  . Smoking status: Never Smoker  . Smokeless tobacco: Never Used  Substance and Sexual Activity  . Alcohol use: Yes    Comment: occas   . Drug use: No  . Sexual activity: Yes    Partners: Male  Other Topics Concern  . Not on file  Social History Narrative  . Not on file   Social Determinants of Health   Financial Resource Strain:   . Difficulty of Paying Living Expenses: Not on file  Food Insecurity:   . Worried About Programme researcher, broadcasting/film/video in the Last Year: Not on file  . Ran Out of Food in the Last Year: Not on file  Transportation Needs:   . Lack of Transportation (Medical): Not on file  . Lack of Transportation (Non-Medical): Not on file  Physical Activity:   . Days of Exercise per Week: Not on file  . Minutes of Exercise per Session: Not on file  Stress:   . Feeling of Stress : Not on file  Social Connections:   . Frequency of Communication with  Friends and Family: Not on file  . Frequency of Social Gatherings with Friends and Family: Not on file  . Attends Religious Services: Not on file  . Active Member of Clubs or Organizations: Not on file  . Attends Banker Meetings: Not on file  . Marital Status: Not on file  Intimate Partner Violence:   . Fear of Current or Ex-Partner: Not on file  . Emotionally Abused: Not on file  . Physically Abused: Not on file  . Sexually Abused: Not on file    Past Surgical History:  Procedure Laterality Date  . ENDOMETRIAL ABLATION  2006   menorraghia  . KNEE SURGERY  2006   left knee  . TONSILLECTOMY    . 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 TUBAL LIGATION      Family History  Problem Relation Age of Onset  . Cancer Father        kidney  . Cancer Maternal Grandmother        bone  . Factor V Leiden deficiency Maternal Grandmother   . Cancer Paternal Grandmother        breast  . Breast cancer Paternal Grandmother   . Asthma Daughter   . Heart attack Paternal Grandfather   . Cancer Mother 41       metastatic lung cancer  . Stroke Mother   . Asthma Daughter   . Factor V Leiden deficiency Maternal Aunt     Allergies  Allergen Reactions  . Codeine Palpitations and Rash    Current Outpatient Medications on File Prior to Visit  Medication Sig Dispense Refill  . diltiazem (TIAZAC) 120 MG 24 hr capsule Take by mouth.    . metoprolol succinate (TOPROL-XL) 100 MG 24 hr tablet Take by mouth.    Marland Kitchen albuterol (PROAIR HFA) 108 (90 Base) MCG/ACT inhaler TAKE 2 PUFFS BY MOUTH EVERY 6 HOURS AS NEEDED FOR WHEEZE OR SHORTNESS OF BREATH 18 g 3  . ALPRAZolam (XANAX) 0.25 MG tablet Take 1 tablet (0.25 mg total) by mouth 2 (two) times daily as needed for anxiety. 5 tablet 0  . aspirin EC 325 MG tablet Take 1 tablet (325 mg total) by mouth 2 (two) times daily. 60 tablet 0  . budesonide (PULMICORT) 180 MCG/ACT inhaler Inhale 2 puffs into the lungs 2 (two) times daily. 1 each 3  . doxycycline (VIBRA-TABS) 100 MG tablet Take 1 tablet (100 mg total) by mouth 2 (two) times daily. Can give caps or generic (Patient not taking: Reported on 10/01/2019) 20 tablet 0  . fenofibrate (TRICOR) 145 MG tablet TAKE 1 TABLET BY MOUTH EVERY DAY 90 tablet 1  . fluticasone (FLONASE) 50 MCG/ACT nasal spray Place 2 sprays into both nostrils daily. 16 g 1  . HYDROcodone-homatropine (HYCODAN) 5-1.5 MG/5ML syrup Take 5 mLs by mouth every 8 (eight) hours as needed for cough. 150 mL 0  . levocetirizine (XYZAL) 5 MG tablet Take 1 tablet (5 mg total) by mouth every evening. 30 tablet 5  . methylPREDNISolone (MEDROL) 4 MG  tablet Standard 6 day taper (Patient not taking: Reported on 10/01/2019) 21 tablet 0  . metoprolol succinate (TOPROL-XL) 50 MG 24 hr tablet TAKE 1 TABLET (50 MG TOTAL) BY MOUTH DAILY. TAKE WITH OR IMMEDIATELY FOLLOWING A MEAL. (Patient not taking: Reported on 10/01/2019) 90 tablet 1  . montelukast (SINGULAIR) 10 MG tablet  TAKE 1 TABLET BY MOUTH EVERYDAY AT BEDTIME 90 tablet 1  . rivaroxaban (XARELTO) 20 MG TABS tablet Take by mouth.    . valsartan-hydrochlorothiazide (DIOVAN-HCT) 160-12.5 MG tablet TAKE 1 TABLET BY MOUTH EVERY DAY 90 tablet 1   No current facility-administered medications on file prior to visit.    BP 119/71   Pulse (!) 106   Resp 16   Ht 5\' 8"  (1.727 m)   Wt 234 lb (106.1 kg)   SpO2 95%   BMI 35.58 kg/m       Objective:   Physical Exam   General- No acute distress. Pleasant patient. Neck- Full range of motion, no jvd Lungs- Clear, even and unlabored. Heart- regular rate and rhythm. Neurologic- CNII- XII grossly intact.  Lower ext- rt lower ext- no longer tender to palpation.     Assessment & Plan:  For dyspnea on exertion post Covid infection along with hypoxemia, continue with Symbicort inhaler.  Continue use 3 L O2 daily.  Follow through with a pulmonologist PFT test and sleep studies.  I do think you can start to do slight very low level deconditioning without oxygen.  You can start to test yourself on how far able to walk before your oxygen starts to drop less than 90%.  But do not over do it and rest immediately once you get 90 or less.  Then get back on your 3 L oxygen.  Hopefully you will begin to see gradual improvement.  I think a lot will depend on your pulmonary function status.  His lung status looks overall good and improving then physical therapy would be more beneficial.  Keep them updated on your PFT results and would also get advice from pulmonologist as to when you can do PT.  You mention you want an oxygen concentrator.  That might be premature  at this point.  I think is best for you to get the pulmonary function test first and also get opinion from pulmonologist.  I will send a note to Dr. as well regarding that request.  I do think that your deconditioning overall physically but I do think you are making some improvement already.  Follow-up in 3 weeks with Dr. Winona Legato or prn.  Regarding disability paperwork have not seen any as of yet.  If you do get copies to bring a rash that you make extra copies as we might want your pulmonologist to fill out paperwork as well.  We will need to review paperwork and see how complex it is.  And if they want Celesta Aver special specific information.  [place], PA-C   Time spent with patient today was 45  minutes which consisted of chart review, discussing diagnoses, work up, treatment, answering questions and documentation.

## 2019-10-01 NOTE — Patient Instructions (Addendum)
Your blood pressure is well controlled today.  Continue current metoprolol and valsartan.  For history of atrial fibrillation, follow through with ablation procedure with cardiology.  Beta-blocker will keep a rate control as well.  For dyspnea on exertion post Covid infection along with hypoxemia, continue with Symbicort inhaler.  Continue use 3 L O2 daily.  Follow through with a pulmonologist PFT test and sleep studies.  I do think you can start to do slight very low level deconditioning without oxygen.  You can start to test yourself on how far able to walk before your oxygen starts to drop less than 90%.  But do not over do it and rest immediately once you get 90 or less.  Then get back on your 3 L oxygen.  Hopefully you will begin to see gradual improvement.  I think a lot will depend on your pulmonary function status.  His lung status looks overall good and improving then physical therapy would be more beneficial.  Keep them updated on your PFT results and would also get advice from pulmonologist as to when you can do PT.  You mention you want an oxygen concentrator.  That might be premature at this point.  I think is best for you to get the pulmonary function test first and also get opinion from pulmonologist.  I will send a note to Dr. Winona Legato as well regarding that request.  I do think that your deconditioning overall physically but I do think you are making some improvement already.  Follow-up in 3 weeks with Dr. Celesta Aver or prn.  Regarding disability paperwork have not seen any as of yet.  If you do get copies to bring a rash that you make extra copies as we might want your pulmonologist to fill out paperwork as well.  We will need to review paperwork and see how complex it is.  And if they want [place] special specific information.  or as needed

## 2019-10-03 ENCOUNTER — Telehealth: Payer: Self-pay

## 2019-10-03 ENCOUNTER — Telehealth: Payer: Self-pay | Admitting: Family Medicine

## 2019-10-03 NOTE — Telephone Encounter (Signed)
Great but also she needs a VV or I do not believe it will get paid for

## 2019-10-03 NOTE — Telephone Encounter (Signed)
Patient has been called and have all paperwork ready to give to provider.

## 2019-10-03 NOTE — Telephone Encounter (Signed)
[employer]- Paperwork  Per your request, this company requesting Oxygen, has the patient's approval.  Will leave detailed information, with paperwork.  Hope this helps.

## 2019-10-03 NOTE — Telephone Encounter (Signed)
Patient scheduled for 9/28-Tuesday@8 :20 am for Virtual Visit, for filling out Oxygen /Short-Term Disability.

## 2019-10-03 NOTE — Telephone Encounter (Signed)
Pt's Daughter dropped off document to be filled out by provider Short Term Disability paperwork (7 pages- white small envelope) Pt would like document to be faxed when ready at [phone]. Document put at from office tray under providers name.

## 2019-10-07 ENCOUNTER — Telehealth (INDEPENDENT_AMBULATORY_CARE_PROVIDER_SITE_OTHER): Payer: 59 | Admitting: Family Medicine

## 2019-10-07 ENCOUNTER — Other Ambulatory Visit: Payer: Self-pay

## 2019-10-07 DIAGNOSIS — U071 COVID-19: Secondary | ICD-10-CM

## 2019-10-07 DIAGNOSIS — J1282 Pneumonia due to coronavirus disease 2019: Secondary | ICD-10-CM | POA: Diagnosis not present

## 2019-10-07 DIAGNOSIS — J329 Chronic sinusitis, unspecified: Secondary | ICD-10-CM | POA: Diagnosis not present

## 2019-10-07 DIAGNOSIS — I10 Essential (primary) hypertension: Secondary | ICD-10-CM | POA: Diagnosis not present

## 2019-10-07 MED ORDER — CEPHALEXIN 500 MG PO CAPS: 500.0000 mg | ORAL_CAPSULE | Freq: Four times a day (QID) | ORAL | 0 refills | Status: DC

## 2019-10-07 MED ORDER — HYDROCODONE-HOMATROPINE 5-1.5 MG/5ML PO SYRP
5.0000 mL | ORAL_SOLUTION | Freq: Three times a day (TID) | ORAL | 0 refills | Status: DC | PRN
Start: 2019-10-07 — End: 2019-11-26

## 2019-10-08 NOTE — Assessment & Plan Note (Signed)
Monitor and report any concerns, no changes to meds. Encouraged heart healthy diet such as the DASH diet and exercise as tolerated.  ?

## 2019-10-08 NOTE — Assessment & Plan Note (Addendum)
She was hospitalized at [facility] on 8/25, was discharged to home on 09/17/2019 on 3 liters of oxygen. She has had trouble getting the equipment she needs to maintain her 24/7 oxygen from [employer] and is interested in getting an oxygen concentrator from another company and some paperwork is signed. Spent 35 minutes discussing case and developing plan of care. Filled out her disability paperwork today

## 2019-10-08 NOTE — Progress Notes (Addendum)
Virtual Visit via Video Note  I connected with Kathleen Church on 10/07/19 at  8:20 AM EDT by a video enabled telemedicine application and verified that I am speaking with the correct person using two identifiers.  Location: Patient: home, patient, her daughter and provider are in the visit Provider: office   I discussed the limitations of evaluation and management by telemedicine and the availability of in person appointments. The patient expressed understanding and agreed to proceed. Tawni Carnes, CMA was able to get the patient set up on a video visit    Subjective:    Patient ID: Kathleen Church, female    DOB: [birth year], 59 y.o.   MRN: [id number]  Chief Complaint  Patient presents with  . Follow-up    Discussing Disability Paperwork    HPI Patient is in today for hospital follow up after developing COVID pneumonia. She is accompanied by her daughter today. She was hospitalized at [facility] on 8/25, was discharged to home on 09/17/2019 on 3 liters of oxygen. She has had trouble getting the equipment she needs to maintain her 24/7 oxygen from [employer] and is interested in getting an oxygen concentrator from another company. Has had an increase in congestion, sinus pressure and cough recently. Denies CP/palp/HA/fevers/GI or GU c/o. Taking meds as prescribed  Past Medical History:  Diagnosis Date  . Allergic state   . Anxiety    when father passed  . Arthritis   . Asthma, mild persistent    allergy induced asthma   . Cervical cancer screening 04/27/2011  . Edema 04/27/2011  . Heart murmur    as an infant   . Hyperlipidemia 04/27/2011  . Hypertension 2010  . Hypokalemia 04/27/2011  . Measles as a child  . Mumps as a child  . Muscle cramp 10/14/2015  . Obesity   . Osteoarthritis of left knee 10/14/2015  . Plantar fasciitis, left 01/19/2013  . Preventative health care 02/13/2011  . Sinusitis 02/13/2011    Past Surgical History:  Procedure Laterality Date  .  ENDOMETRIAL ABLATION  2006   menorraghia  . KNEE SURGERY  2006   left knee  . TONSILLECTOMY    . 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 TUBAL LIGATION      Family History  Problem Relation Age of Onset  . Cancer Father        kidney  . Cancer Maternal Grandmother        bone  . Factor V Leiden deficiency Maternal Grandmother   . Cancer Paternal Grandmother        breast  . Breast cancer Paternal Grandmother   . Asthma Daughter   . Heart attack Paternal Grandfather   . Cancer Mother 42       metastatic lung cancer  . Stroke Mother   . Asthma Daughter   . Factor V Leiden deficiency Maternal Aunt     Social History   Socioeconomic History  . Marital status: Widowed    Spouse name: Not on file  . Number of children: Not on file  . Years of education: Not on file  . Highest education level: Not on file  Occupational History  . Not on file  Tobacco Use  . Smoking status: Never Smoker  . Smokeless tobacco: Never Used  Substance and Sexual Activity  . Alcohol use: Yes  Comment: occas   . Drug use: No  . Sexual activity: Yes    Partners: Male  Other Topics Concern  . Not on file  Social History Narrative  . Not on file   Social Determinants of Health   Financial Resource Strain:   . Difficulty of Paying Living Expenses: Not on file  Food Insecurity:   . Worried About Programme researcher, broadcasting/film/video in the Last Year: Not on file  . Ran Out of Food in the Last Year: Not on file  Transportation Needs:   . Lack of Transportation (Medical): Not on file  . Lack of Transportation (Non-Medical): Not on file  Physical Activity:   . Days of Exercise per Week: Not on file  . Minutes of Exercise per Session: Not on file  Stress:   . Feeling of Stress : Not on file  Social Connections:   . Frequency of Communication with Friends and Family: Not  on file  . Frequency of Social Gatherings with Friends and Family: Not on file  . Attends Religious Services: Not on file  . Active Member of Clubs or Organizations: Not on file  . Attends Banker Meetings: Not on file  . Marital Status: Not on file  Intimate Partner Violence:   . Fear of Current or Ex-Partner: Not on file  . Emotionally Abused: Not on file  . Physically Abused: Not on file  . Sexually Abused: Not on file    Outpatient Medications Prior to Visit  Medication Sig Dispense Refill  . albuterol (PROAIR HFA) 108 (90 Base) MCG/ACT inhaler TAKE 2 PUFFS BY MOUTH EVERY 6 HOURS AS NEEDED FOR WHEEZE OR SHORTNESS OF BREATH 18 g 3  . ALPRAZolam (XANAX) 0.25 MG tablet Take 1 tablet (0.25 mg total) by mouth 2 (two) times daily as needed for anxiety. 5 tablet 0  . aspirin EC 325 MG tablet Take 1 tablet (325 mg total) by mouth 2 (two) times daily. 60 tablet 0  . budesonide (PULMICORT) 180 MCG/ACT inhaler Inhale 2 puffs into the lungs 2 (two) times daily. 1 each 3  . diltiazem (TIAZAC) 120 MG 24 hr capsule Take by mouth.    . fenofibrate (TRICOR) 145 MG tablet TAKE 1 TABLET BY MOUTH EVERY DAY 90 tablet 1  . fluticasone (FLONASE) 50 MCG/ACT nasal spray Place 2 sprays into both nostrils daily. 16 g 1  . levocetirizine (XYZAL) 5 MG tablet Take 1 tablet (5 mg total) by mouth every evening. 30 tablet 5  . metoprolol succinate (TOPROL-XL) 100 MG 24 hr tablet Take by mouth.    . metoprolol succinate (TOPROL-XL) 50 MG 24 hr tablet TAKE 1 TABLET (50 MG TOTAL) BY MOUTH DAILY. TAKE WITH OR IMMEDIATELY FOLLOWING A MEAL. 90 tablet 1  . montelukast (SINGULAIR) 10 MG tablet TAKE 1 TABLET BY MOUTH EVERYDAY AT BEDTIME 90 tablet 1  . rivaroxaban (XARELTO) 20 MG TABS tablet Take by mouth.    . valsartan-hydrochlorothiazide (DIOVAN-HCT) 160-12.5 MG tablet TAKE 1 TABLET BY MOUTH EVERY DAY 90 tablet 1  . doxycycline (VIBRA-TABS) 100 MG tablet Take 1 tablet (100 mg total) by mouth 2 (two) times  daily. Can give caps or generic 20 tablet 0  . HYDROcodone-homatropine (HYCODAN) 5-1.5 MG/5ML syrup Take 5 mLs by mouth every 8 (eight) hours as needed for cough. 150 mL 0  . methylPREDNISolone (MEDROL) 4 MG tablet Standard 6 day taper 21 tablet 0   No facility-administered medications prior to visit.    Allergies  Allergen  Reactions  . Codeine Palpitations and Rash    Review of Systems  Constitutional: Positive for chills and malaise/fatigue. Negative for fever.  HENT: Positive for congestion and sinus pain.   Eyes: Negative for blurred vision.  Respiratory: Positive for cough, sputum production and shortness of breath.   Cardiovascular: Negative for chest pain, palpitations and leg swelling.  Gastrointestinal: Negative for abdominal pain, blood in stool and nausea.  Genitourinary: Negative for dysuria and frequency.  Musculoskeletal: Positive for myalgias. Negative for falls.  Skin: Negative for rash.  Neurological: Positive for headaches. Negative for dizziness and loss of consciousness.  Endo/Heme/Allergies: Negative for environmental allergies.  Psychiatric/Behavioral: Positive for depression. The patient is not nervous/anxious.        Objective:    Physical Exam Constitutional:      Appearance: Normal appearance.  HENT:     Head: Normocephalic and atraumatic.     Right Ear: External ear normal.     Left Ear: External ear normal.     Nose: Nose normal.  Eyes:     General:        Right eye: No discharge.        Left eye: No discharge.  Pulmonary:     Effort: Pulmonary effort is normal.  Neurological:     Mental Status: She is alert and oriented to person, place, and time.  Psychiatric:        Behavior: Behavior normal.     BP (!) 140/93 (BP Location: Right Arm, Patient Position: Sitting, Cuff Size: Normal) Comment: before taking medication  Pulse 71   Ht 5\' 8"  (1.727 m)   Wt 234 lb (106.1 kg)   SpO2 95% Comment: w oxygen at 3 liters  BMI 35.58 kg/m  Wt  Readings from Last 3 Encounters:  10/07/19 234 lb (106.1 kg)  10/01/19 234 lb (106.1 kg)  09/24/19 234 lb 12.8 oz (106.5 kg)    Diabetic Foot Exam - Simple   No data filed     Lab Results  Component Value Date   WBC 8.3 09/24/2019   HGB 13.1 09/24/2019   HCT 40.0 09/24/2019   PLT 352 09/24/2019   GLUCOSE 109 (H) 09/24/2019   CHOL 167 12/02/2018   TRIG 109.0 12/02/2018   HDL 45.60 12/02/2018   LDLDIRECT 133.4 02/08/2011   LDLCALC 100 (H) 12/02/2018   ALT 46 (H) 09/24/2019   AST 25 09/24/2019   NA 141 09/24/2019   K 3.8 09/24/2019   CL 102 09/24/2019   CREATININE 0.57 09/24/2019   BUN 13 09/24/2019   CO2 29 09/24/2019   TSH 1.24 12/02/2018   INR 1.03 10/08/2015   HGBA1C 4.9 12/02/2018    Lab Results  Component Value Date   TSH 1.24 12/02/2018   Lab Results  Component Value Date   WBC 8.3 09/24/2019   HGB 13.1 09/24/2019   HCT 40.0 09/24/2019   MCV 94.1 09/24/2019   PLT 352 09/24/2019   Lab Results  Component Value Date   NA 141 09/24/2019   K 3.8 09/24/2019   CO2 29 09/24/2019   GLUCOSE 109 (H) 09/24/2019   BUN 13 09/24/2019   CREATININE 0.57 09/24/2019   BILITOT 0.8 09/24/2019   ALKPHOS 62 12/02/2018   AST 25 09/24/2019   ALT 46 (H) 09/24/2019   PROT 6.0 (L) 09/24/2019   ALBUMIN 3.9 12/02/2018   CALCIUM 9.3 09/24/2019   ANIONGAP 7 10/16/2015   GFR 96.51 12/02/2018   Lab Results  Component Value Date  CHOL 167 12/02/2018   Lab Results  Component Value Date   HDL 45.60 12/02/2018   Lab Results  Component Value Date   LDLCALC 100 (H) 12/02/2018   Lab Results  Component Value Date   TRIG 109.0 12/02/2018   Lab Results  Component Value Date   CHOLHDL 4 12/02/2018   Lab Results  Component Value Date   HGBA1C 4.9 12/02/2018       Assessment & Plan:   Problem List Items Addressed This Visit    Hypertension    Monitor and report any concerns, no changes to meds. Encouraged heart healthy diet such as the DASH diet and exercise as  tolerated.       Sinusitis    Has had an increase in congestion, sinus pressure and cough recently. Will try a course of Keflex and she will report any concerns. Given refill on Hydromet.       Relevant Medications   cephALEXin (KEFLEX) 500 MG capsule   HYDROcodone-homatropine (HYCODAN) 5-1.5 MG/5ML syrup   Pneumonia due to COVID-19 virus    She was [facility], was discharged to home on 09/17/2019 on 3 liters of oxygen. She has had trouble getting the equipment she needs to maintain her 24/7 oxygen from [employer] and is interested in getting an oxygen concentrator from another company and some paperwork is signed. Spent 35 minutes discussing case and developing plan of care. Filled out her disability paperwork today      Relevant Medications   cephALEXin (KEFLEX) 500 MG capsule   HYDROcodone-homatropine (HYCODAN) 5-1.5 MG/5ML syrup      I have discontinued Tammi SouKimberly K. Pritchard's doxycycline and methylPREDNISolone. I am also having her start on cephALEXin. Additionally, I am having her maintain her aspirin EC, levocetirizine, fluticasone, ALPRAZolam, montelukast, metoprolol succinate, valsartan-hydrochlorothiazide, albuterol, budesonide, rivaroxaban, fenofibrate, diltiazem, metoprolol succinate, and HYDROcodone-homatropine.  Meds ordered this encounter  Medications  . cephALEXin (KEFLEX) 500 MG capsule    Sig: Take 1 capsule (500 mg total) by mouth 4 (four) times daily.    Dispense:  40 capsule    Refill:  0  . HYDROcodone-homatropine (HYCODAN) 5-1.5 MG/5ML syrup    Sig: Take 5 mLs by mouth every 8 (eight) hours as needed for cough.    Dispense:  150 mL    Refill:  0     Danise EdgeStacey Milia Warth, MD   I discussed the assessment and treatment plan with the patient. The patient was provided an opportunity to ask questions and all were answered. The patient agreed with the plan and demonstrated an understanding of the instructions.   The patient was advised to  call back or seek an in-person evaluation if the symptoms worsen or if the condition fails to improve as anticipated.  I provided 35 minutes of non-face-to-face time during this encounter.   Danise EdgeStacey Marialy Urbanczyk, MD

## 2019-10-08 NOTE — Assessment & Plan Note (Signed)
Has had an increase in congestion, sinus pressure and cough recently. Will try a course of Keflex and she will report any concerns. Given refill on Hydromet.

## 2019-10-08 NOTE — Telephone Encounter (Signed)
Daughter calling stating Dr. Abner Greenspan was supposed to fill form for oxygen tank, they have not received rx yet.

## 2019-10-13 NOTE — Telephone Encounter (Signed)
Patient notified...paperwork has been faxed.

## 2019-10-13 NOTE — Telephone Encounter (Signed)
Patient notified and paperwork haas been faxed.

## 2019-10-15 ENCOUNTER — Telehealth: Payer: Self-pay | Admitting: Family Medicine

## 2019-10-15 ENCOUNTER — Other Ambulatory Visit: Payer: Self-pay | Admitting: Family Medicine

## 2019-10-15 MED ORDER — AMOXICILLIN-POT CLAVULANATE 875-125 MG PO TABS: 1.0000 | ORAL_TABLET | Freq: Two times a day (BID) | ORAL | 0 refills | Status: DC

## 2019-10-15 NOTE — Telephone Encounter (Signed)
Caller name: [name] [facility]) Call back number: [phone]  Calling for verbal ot 2 times a week for 1 week 1 time a week for 1 week Effective 10/18/19

## 2019-10-15 NOTE — Telephone Encounter (Signed)
Have spoken with Kathleen Church (OT) with [facility] and has noticed patient right leg has redness higher up the right leg.  Patient is taking Keflex 500 mg. 4x daily.  Kathleen Church was just wondering if patient may need to change to another medication or other options.

## 2019-10-15 NOTE — Telephone Encounter (Signed)
Thank you. Verbal Order for OT-[facility] has been called and confirmed by [name] OT, effective date 10/18/19.

## 2019-10-15 NOTE — Telephone Encounter (Signed)
I have sent in a prescription for Augmentin she can switch to from the Keflex but if she worsens or does not respond she will need to be seen

## 2019-10-15 NOTE — Telephone Encounter (Signed)
Yes please give VO as requested °

## 2019-10-15 NOTE — Telephone Encounter (Signed)
Caller : [name] -OT ([facility]) Call Back # [phone]  Per [name] OT,  Patient leg is red and swollen , patient is taking medication to reduced cellulitis . He would like to report that patient is on day 7 of medication. Per [name], Leg redness and swelling has moved up the leg.

## 2019-10-15 NOTE — Telephone Encounter (Signed)
Kathleen Church, [facility], has been called, as okayed by provider, for Verbal Orders for (OT), start date of 10/18/19.

## 2019-10-16 NOTE — Telephone Encounter (Signed)
Called Kathleen Church and [name] to inform of change made by provider to [facility]. VM message left for Kathleen spoke with [name] who understands if no improvements or relief are noted she will need to be seen in office

## 2019-10-21 ENCOUNTER — Encounter: Payer: Self-pay | Admitting: Medical

## 2019-10-21 ENCOUNTER — Other Ambulatory Visit: Payer: Self-pay

## 2019-10-21 ENCOUNTER — Ambulatory Visit (HOSPITAL_COMMUNITY)
Admission: RE | Admit: 2019-10-21 | Discharge: 2019-10-21 | Disposition: A | Discharge status: Home / Self Care | Payer: 59 | Source: Ambulatory Visit | Attending: Medical | Admitting: Medical

## 2019-10-21 ENCOUNTER — Ambulatory Visit: Payer: 59 | Admitting: Medical

## 2019-10-21 VITALS — BP 130/70 | HR 72 | Resp 18 | Ht 68.0 in | Wt 238.4 lb

## 2019-10-21 DIAGNOSIS — L089 Local infection of the skin and subcutaneous tissue, unspecified: Secondary | ICD-10-CM

## 2019-10-21 DIAGNOSIS — Z8616 Personal history of COVID-19: Secondary | ICD-10-CM | POA: Diagnosis not present

## 2019-10-21 DIAGNOSIS — R0600 Dyspnea, unspecified: Secondary | ICD-10-CM

## 2019-10-21 DIAGNOSIS — M79661 Pain in right lower leg: Secondary | ICD-10-CM | POA: Diagnosis present

## 2019-10-21 DIAGNOSIS — I10 Essential (primary) hypertension: Secondary | ICD-10-CM | POA: Diagnosis not present

## 2019-10-21 LAB — CBC WITH DIFFERENTIAL/PLATELET
Absolute Monocytes: 851 cells/uL (ref 200–950)
Basophils Absolute: 69 cells/uL (ref 0–200)
Basophils Relative: 0.7 %
Eosinophils Absolute: 248 cells/uL (ref 15–500)
Eosinophils Relative: 2.5 %
HCT: 36.5 % (ref 35.0–45.0)
Hemoglobin: 12.2 g/dL (ref 11.7–15.5)
Lymphs Abs: 2000 cells/uL (ref 850–3900)
MCH: 31.4 pg (ref 27.0–33.0)
MCHC: 33.4 g/dL (ref 32.0–36.0)
MCV: 94.1 fL (ref 80.0–100.0)
MPV: 9.1 fL (ref 7.5–12.5)
Monocytes Relative: 8.6 %
Neutro Abs: 6732 cells/uL (ref 1500–7800)
Neutrophils Relative %: 68 %
Platelets: 434 10*3/uL — ABNORMAL HIGH (ref 140–400)
RBC: 3.88 10*6/uL (ref 3.80–5.10)
RDW: 14.2 % (ref 11.0–15.0)
Total Lymphocyte: 20.2 %
WBC: 9.9 10*3/uL (ref 3.8–10.8)

## 2019-10-21 NOTE — Patient Instructions (Addendum)
For dyspnea post covid continue with pulmicort and oxygen. Follow up with pulmonologist as scheduled.  BP well controlled today on recheck. Continue toprol  For hx a fibrillation continue beta  blocker and xarelto. Hopefully cardioversion will be successful.  You do appear to have possible skin infection with some component of dependant edema. Continue augmentin. Get cbc to assess infection fighting cells.  Known vascular issue for which you had vascular surgery scheduled. Will get [place] to make sure no dvt. Ask you call vascular MD office to get scheduled for follow up/procedure.  Follow up 3 weeks or as needed

## 2019-10-21 NOTE — Progress Notes (Signed)
Subjective:    Patient ID: Kathleen Church, female    DOB: 01/16/1960, 59 y.o.   MRN: [id number]  HPI  Pt in for follow up.  Pt feels like her endurance is improving since recovering from covid.   Pt is getting cardioversion tomorrow. Pt developed atrial fibrillation while she was hospitalized with covid. Pt has been on xarelto. She is not skipping any doses.   Pt has seen pulmonologist. They want her to continue pulmicort. Also they want her to get sleep study.   Pt has had redness to lower distal 1/3 pretibial area region. Area less red than last week after Dr. Abner Greenspan send in augmentin (pt finished doxycycline). In morning she has very thin calf on rt side. After 30 minutes it is swollen.  Pt saw vascular surgeon in august. She was going to have vascular procedure but was cancelled due to covid hospitalization.    Review of Systems  Constitutional: Negative for chills, fatigue and fever.  HENT: Negative for congestion and ear pain.   Respiratory: Positive for shortness of breath. Negative for cough and wheezing.        Improved exercise but still some shortness of breath.  Cardiovascular: Negative for chest pain and palpitations.  Gastrointestinal: Negative for abdominal pain.  Endocrine: Negative for polydipsia, polyphagia and polyuria.  Genitourinary: Negative for dysuria.  Musculoskeletal: Negative for back pain.       Rt calf swelling and some pain. See hpid.  Skin: Negative for rash.  Neurological: Negative for dizziness, facial asymmetry and headaches.  Hematological: Negative for adenopathy. Does not bruise/bleed easily.  Psychiatric/Behavioral: Negative for behavioral problems and confusion. The patient is not nervous/anxious.     Past Medical History:  Diagnosis Date   Allergic state    Anxiety    when father passed   Arthritis    Asthma, mild persistent    allergy induced asthma    Cervical cancer screening 04/27/2011   Edema 04/27/2011   Heart murmur     as an infant    Hyperlipidemia 04/27/2011   Hypertension 2010   Hypokalemia 04/27/2011   Measles as a child   Mumps as a child   Muscle cramp 10/14/2015   Obesity    Osteoarthritis of left knee 10/14/2015   Plantar fasciitis, left 01/19/2013   Preventative health care 02/13/2011   Sinusitis 02/13/2011     Social History   Socioeconomic History   Marital status: Widowed    Spouse name: Not on file   Number of children: Not on file   Years of education: Not on file   Highest education level: Not on file  Occupational History   Not on file  Tobacco Use   Smoking status: Never Smoker   Smokeless tobacco: Never Used  Substance and Sexual Activity   Alcohol use: Yes    Comment: occas    Drug use: No   Sexual activity: Yes    Partners: Male  Other Topics Concern   Not on file  Social History Narrative   Not on file   Social Determinants of Health   Financial Resource Strain:    Difficulty of Paying Living Expenses: Not on file  Food Insecurity:    Worried About Running Out of Food in the Last Year: Not on file   Ran Out of Food in the Last Year: Not on file  Transportation Needs:    Lack of Transportation (Medical): Not on file   Lack of Transportation (Non-Medical): Not on  file  Physical Activity:    Days of Exercise per Week: Not on file   Minutes of Exercise per Session: Not on file  Stress:    Feeling of Stress : Not on file  Social Connections:    Frequency of Communication with Friends and Family: Not on file   Frequency of Social Gatherings with Friends and Family: Not on file   Attends Religious Services: Not on file   Active Member of Clubs or Organizations: Not on file   Attends Banker Meetings: Not on file   Marital Status: Not on file  Intimate Partner Violence:    Fear of Current or Ex-Partner: Not on file   Emotionally Abused: Not on file   Physically Abused: Not on file   Sexually Abused: Not on  file    Past Surgical History:  Procedure Laterality Date   ENDOMETRIAL ABLATION  2006   menorraghia   KNEE SURGERY  2006   left knee   TONSILLECTOMY     TOTAL KNEE ARTHROPLASTY Left 10/15/2015   Procedure: LEFT TOTAL KNEE ARTHROPLASTY;  Surgeon: Eugenia Mcalpine, MD;  Location: [facility];  Service: Orthopedics;  Laterality: Left;   TUBAL LIGATION      Family History  Problem Relation Age of Onset   Cancer Father        kidney   Cancer Maternal Grandmother        bone   Factor V Leiden deficiency Maternal Grandmother    Cancer Paternal Grandmother        breast   Breast cancer Paternal Grandmother    Asthma Daughter    Heart attack Paternal Grandfather    Cancer Mother 26       metastatic lung cancer   Stroke Mother    Asthma Daughter    Factor V Leiden deficiency Maternal Aunt     Allergies  Allergen Reactions   Codeine Palpitations and Rash    Current Outpatient Medications on File Prior to Visit  Medication Sig Dispense Refill   albuterol (PROAIR HFA) 108 (90 Base) MCG/ACT inhaler TAKE 2 PUFFS BY MOUTH EVERY 6 HOURS AS NEEDED FOR WHEEZE OR SHORTNESS OF BREATH 18 g 3   ALPRAZolam (XANAX) 0.25 MG tablet Take 1 tablet (0.25 mg total) by mouth 2 (two) times daily as needed for anxiety. 5 tablet 0   amoxicillin-clavulanate (AUGMENTIN) 875-125 MG tablet Take 1 tablet by mouth 2 (two) times daily. 14 tablet 0   aspirin EC 325 MG tablet Take 1 tablet (325 mg total) by mouth 2 (two) times daily. 60 tablet 0   budesonide (PULMICORT) 180 MCG/ACT inhaler Inhale 2 puffs into the lungs 2 (two) times daily. 1 each 3   cephALEXin (KEFLEX) 500 MG capsule Take 1 capsule (500 mg total) by mouth 4 (four) times daily. 40 capsule 0   diltiazem (TIAZAC) 120 MG 24 hr capsule Take by mouth.     fenofibrate (TRICOR) 145 MG tablet TAKE 1 TABLET BY MOUTH EVERY DAY 90 tablet 1   fluticasone (FLONASE) 50 MCG/ACT nasal spray Place 2 sprays into both nostrils daily. 16 g 1    HYDROcodone-homatropine (HYCODAN) 5-1.5 MG/5ML syrup Take 5 mLs by mouth every 8 (eight) hours as needed for cough. 150 mL 0   levocetirizine (XYZAL) 5 MG tablet Take 1 tablet (5 mg total) by mouth every evening. 30 tablet 5   metoprolol succinate (TOPROL-XL) 50 MG 24 hr tablet TAKE 1 TABLET (50 MG TOTAL) BY MOUTH DAILY. TAKE WITH OR  IMMEDIATELY FOLLOWING A MEAL. 90 tablet 1   montelukast (SINGULAIR) 10 MG tablet TAKE 1 TABLET BY MOUTH EVERYDAY AT BEDTIME 90 tablet 1   rivaroxaban (XARELTO) 20 MG TABS tablet Take by mouth.     valsartan-hydrochlorothiazide (DIOVAN-HCT) 160-12.5 MG tablet TAKE 1 TABLET BY MOUTH EVERY DAY 90 tablet 1   metoprolol succinate (TOPROL-XL) 100 MG 24 hr tablet Take by mouth.     No current facility-administered medications on file prior to visit.    BP 130/70    Pulse 72    Resp 18    Ht 5\' 8"  (1.727 m)    Wt 238 lb 6.4 oz (108.1 kg)    SpO2 95%    BMI 36.25 kg/m      Objective:   Physical Exam  General Mental Status- Alert. General Appearance- Not in acute distress.   Skin General: Color- Normal Color. Moisture- Normal Moisture.  Neck Carotid Arteries- Normal color. Moisture- Normal Moisture. No carotid bruits. No JVD.  Chest and Lung Exam Auscultation: Breath Sounds:-Normal.  Cardiovascular Auscultation:Rythm- Regular. Murmurs & Other Heart Sounds:Auscultation of the heart reveals- No Murmurs.  Abdomen Inspection:-Inspeection Normal. Palpation/Percussion:Note:No mass. Palpation and Percussion of the abdomen reveal- Non Tender, Non Distended + BS, no rebound or guarding.    Neurologic Cranial Nerve exam:- CN III-XII intact Normal/Intact Strength:- 5/5 equal and symmetric strength both upper and lower extremities.  Rt lower ext- moderate swelling calf. Distal 1/3 anterior aspect mild pink in color. Mild warmth and mild tender.     Assessment & Plan:  For dyspnea post covid continue with pulmicort and oxygen. Follow up with  pulmonologist as scheduled.  BP well controlled today on recheck. Continue toprol  For hx a fibrillation continue beta  blocker and xarelto. Hopefully cardioversion will be successful.  You do appear to have possible skin infection with some component of dependant edema. Continue augmentin. Get cbc to assess infection fighting cells.  Known vascular issue for which you had vascular surgery scheduled. Will get to make sure no dvt. Ask you call vascular MD office to get scheduled for follow up/procedure.  Follow up 3 weeks or as needed   Time spent with patient today was 40  minutes which consisted of chart review, discussing diagnoses, work up, treatment and documentation.

## 2019-11-07 ENCOUNTER — Telehealth: Payer: Self-pay | Admitting: Family Medicine

## 2019-11-07 NOTE — Telephone Encounter (Signed)
Eric contacted and given VO

## 2019-11-07 NOTE — Telephone Encounter (Signed)
Kathleen Church with [facility]  Call Back # [phone]  Calling in reference to getting a verbal order for OT continuation.  1x2   Please Advise

## 2019-11-07 NOTE — Telephone Encounter (Signed)
Yes OK to give VO for OT

## 2019-11-07 NOTE — Telephone Encounter (Signed)
Kathleen Church is on his way to the patient house and need verbal order in the next 30 mins

## 2019-11-07 NOTE — Telephone Encounter (Signed)
is it ok to give a verbal order to continue OT services

## 2019-11-12 ENCOUNTER — Telehealth: Payer: Self-pay | Admitting: Family Medicine

## 2019-11-12 ENCOUNTER — Telehealth: Payer: Self-pay

## 2019-11-12 ENCOUNTER — Other Ambulatory Visit: Payer: Self-pay | Admitting: Family Medicine

## 2019-11-12 NOTE — Telephone Encounter (Signed)
[name] With [facility] Discharge for OT.. Effective 11/08/19. She is no longer home bound and he suggest  her for out patient OT

## 2019-11-12 NOTE — Telephone Encounter (Signed)
Patient called to follow up from speaking with Dr. Mariel Aloe nurse on Monday regarding a RTW letter so that she can go back to work part-time and her short term disability paperwork.  Please call patient back to update her on the status of this.  Patient would like to get the letter to RTW on a part-time basis as soon as possible.

## 2019-11-13 ENCOUNTER — Telehealth: Payer: Self-pay | Admitting: Family Medicine

## 2019-11-13 NOTE — Telephone Encounter (Signed)
Patient called and said that [facility] told her that her Metoprolol has been changed from 100 to 50mg  and she was unaware. The patient is wanting a call back in regards to why the dosage has been dropped. She doesn't want to take the 50 mg when she is supposed to take the 100mg  and vis versa.   [phone]

## 2019-11-13 NOTE — Telephone Encounter (Signed)
I can see her tomorrow virtually but also fine with me if she sees someone else if she wants to return to work sooner. What day does she want to return?

## 2019-11-13 NOTE — Telephone Encounter (Signed)
Called pt to clarify appointment  and clearance for return to work. Pt would like a return to work for 1/2 day has appointment on 11/25/19. States she'd be ok with seeing Ramon Dredge if you schedule doesn't permit a earlier appointment than 11/16. She stated she could see him for the pt return then see you on 11/25/19 for full return to work.  Pt also stated she no longer needs the OT referral d/t her walking her driveway daily.stated she just needs something to help her keep busy until she can completely get back to work

## 2019-11-13 NOTE — Telephone Encounter (Signed)
Pt needs to schedule a office visit or virtual visit for clearance and return to work letter.

## 2019-11-14 ENCOUNTER — Other Ambulatory Visit: Payer: Self-pay

## 2019-11-14 ENCOUNTER — Ambulatory Visit (INDEPENDENT_AMBULATORY_CARE_PROVIDER_SITE_OTHER): Payer: 59 | Admitting: Medical

## 2019-11-14 ENCOUNTER — Encounter: Payer: Self-pay | Admitting: Medical

## 2019-11-14 VITALS — BP 136/80 | HR 104 | Temp 97.9°F | Resp 16 | Wt 229.2 lb

## 2019-11-14 DIAGNOSIS — L819 Disorder of pigmentation, unspecified: Secondary | ICD-10-CM

## 2019-11-14 DIAGNOSIS — Z8616 Personal history of COVID-19: Secondary | ICD-10-CM

## 2019-11-14 DIAGNOSIS — R059 Cough, unspecified: Secondary | ICD-10-CM | POA: Diagnosis not present

## 2019-11-14 DIAGNOSIS — I10 Essential (primary) hypertension: Secondary | ICD-10-CM | POA: Diagnosis not present

## 2019-11-14 DIAGNOSIS — I4891 Unspecified atrial fibrillation: Secondary | ICD-10-CM

## 2019-11-14 MED ORDER — BENZONATATE 100 MG PO CAPS
100.0000 mg | ORAL_CAPSULE | Freq: Three times a day (TID) | ORAL | 0 refills | Status: DC | PRN
Start: 2019-11-14 — End: 2021-01-18

## 2019-11-14 NOTE — Progress Notes (Signed)
Subjective:    Patient ID: Kathleen Church, female    DOB: 1960-04-14, 59 y.o.   MRN: [id number]  HPI  Pt in for follow up.  Pt is post covid for about 8 weeks. She had to use   o2 for a weeks. She gradually wheened off. Now not using any 02 for 10 days.   She was hospitalized with covid prolonged illness, respiratory distress, covid pneumonia and Atrial fibrillation when hospitalized.  She has gradually progressed over past approximate 8 weeks.  Pt can walk without any significant shortness of breath.  Rare occasional dry cough. She wants to return to workfor 4 hours daily for one week. Then see if can work more hours if 1st week goes well.  Pt has oxygen concentrator that she could use if needed.    Review of Systems  Constitutional: Negative for chills, fatigue and fever.  HENT: Negative for congestion.   Respiratory: Positive for cough. Negative for chest tightness, shortness of breath and wheezing.        Rare cough.  Cardiovascular: Negative for chest pain and palpitations.  Gastrointestinal: Negative for abdominal pain.  Genitourinary: Negative for dysuria, flank pain and frequency.  Musculoskeletal: Negative for back pain.  Skin: Negative for rash.  Neurological: Negative for dizziness, numbness and headaches.  Hematological: Negative for adenopathy. Does not bruise/bleed easily.  Psychiatric/Behavioral: Negative for behavioral problems and confusion.   Past Medical History:  Diagnosis Date  . Allergic state   . Anxiety    when father passed  . Arthritis   . Asthma, mild persistent    allergy induced asthma   . Cervical cancer screening 04/27/2011  . Edema 04/27/2011  . Heart murmur    as an infant   . Hyperlipidemia 04/27/2011  . Hypertension 2010  . Hypokalemia 04/27/2011  . Measles as a child  . Mumps as a child  . Muscle cramp 10/14/2015  . Obesity   . Osteoarthritis of left knee 10/14/2015  . Plantar fasciitis, left 01/19/2013  . Preventative health  care 02/13/2011  . Sinusitis 02/13/2011     Social History   Socioeconomic History  . Marital status: Widowed    Spouse name: Not on file  . Number of children: Not on file  . Years of education: Not on file  . Highest education level: Not on file  Occupational History  . Not on file  Tobacco Use  . Smoking status: Never Smoker  . Smokeless tobacco: Never Used  Substance and Sexual Activity  . Alcohol use: Yes    Comment: occas   . Drug use: No  . Sexual activity: Yes    Partners: Male  Other Topics Concern  . Not on file  Social History Narrative  . Not on file   Social Determinants of Health   Financial Resource Strain:   . Difficulty of Paying Living Expenses: Not on file  Food Insecurity:   . Worried About Programme researcher, broadcasting/film/video in the Last Year: Not on file  . Ran Out of Food in the Last Year: Not on file  Transportation Needs:   . Lack of Transportation (Medical): Not on file  . Lack of Transportation (Non-Medical): Not on file  Physical Activity:   . Days of Exercise per Week: Not on file  . Minutes of Exercise per Session: Not on file  Stress:   . Feeling of Stress : Not on file  Social Connections:   . Frequency of Communication with Friends and  Family: Not on file  . Frequency of Social Gatherings with Friends and Family: Not on file  . Attends Religious Services: Not on file  . Active Member of Clubs or Organizations: Not on file  . Attends Banker Meetings: Not on file  . Marital Status: Not on file  Intimate Partner Violence:   . Fear of Current or Ex-Partner: Not on file  . Emotionally Abused: Not on file  . Physically Abused: Not on file  . Sexually Abused: Not on file    Past Surgical History:  Procedure Laterality Date  . ENDOMETRIAL ABLATION  2006   menorraghia  . KNEE SURGERY  2006   left knee  . TONSILLECTOMY    . TOTAL KNEE ARTHROPLASTY Left 10/15/2015   Procedure: LEFT TOTAL KNEE ARTHROPLASTY;  Surgeon: Eugenia Mcalpine, MD;   Location: [facility];  Service: Orthopedics;  Laterality: Left;  . TUBAL LIGATION      Family History  Problem Relation Age of Onset  . Cancer Father        kidney  . Cancer Maternal Grandmother        bone  . Factor V Leiden deficiency Maternal Grandmother   . Cancer Paternal Grandmother        breast  . Breast cancer Paternal Grandmother   . Asthma Daughter   . Heart attack Paternal Grandfather   . Cancer Mother 52       metastatic lung cancer  . Stroke Mother   . Asthma Daughter   . Factor V Leiden deficiency Maternal Aunt     Allergies  Allergen Reactions  . Codeine Palpitations and Rash    Current Outpatient Medications on File Prior to Visit  Medication Sig Dispense Refill  . albuterol (PROAIR HFA) 108 (90 Base) MCG/ACT inhaler TAKE 2 PUFFS BY MOUTH EVERY 6 HOURS AS NEEDED FOR WHEEZE OR SHORTNESS OF BREATH 18 g 3  . ALPRAZolam (XANAX) 0.25 MG tablet Take 1 tablet (0.25 mg total) by mouth 2 (two) times daily as needed for anxiety. 5 tablet 0  . amoxicillin-clavulanate (AUGMENTIN) 875-125 MG tablet Take 1 tablet by mouth 2 (two) times daily. 14 tablet 0  . aspirin EC 325 MG tablet Take 1 tablet (325 mg total) by mouth 2 (two) times daily. 60 tablet 0  . budesonide (PULMICORT) 180 MCG/ACT inhaler Inhale 2 puffs into the lungs 2 (two) times daily. 1 each 3  . cephALEXin (KEFLEX) 500 MG capsule Take 1 capsule (500 mg total) by mouth 4 (four) times daily. 40 capsule 0  . diltiazem (TIAZAC) 120 MG 24 hr capsule Take by mouth.    . fenofibrate (TRICOR) 145 MG tablet TAKE 1 TABLET BY MOUTH EVERY DAY 90 tablet 1  . fluticasone (FLONASE) 50 MCG/ACT nasal spray Place 2 sprays into both nostrils daily. 16 g 1  . HYDROcodone-homatropine (HYCODAN) 5-1.5 MG/5ML syrup Take 5 mLs by mouth every 8 (eight) hours as needed for cough. 150 mL 0  . levocetirizine (XYZAL) 5 MG tablet Take 1 tablet (5 mg total) by mouth every evening. 30 tablet 5  . metoprolol succinate (TOPROL-XL) 50 MG 24 hr  tablet TAKE 1 TABLET (50 MG TOTAL) BY MOUTH DAILY. TAKE WITH OR IMMEDIATELY FOLLOWING A MEAL. 90 tablet 1  . montelukast (SINGULAIR) 10 MG tablet TAKE 1 TABLET BY MOUTH EVERYDAY AT BEDTIME 90 tablet 1  . rivaroxaban (XARELTO) 20 MG TABS tablet Take by mouth.    . valsartan-hydrochlorothiazide (DIOVAN-HCT) 160-12.5 MG tablet TAKE 1 TABLET BY  MOUTH EVERY DAY 90 tablet 1  . metoprolol succinate (TOPROL-XL) 100 MG 24 hr tablet Take by mouth.     No current facility-administered medications on file prior to visit.    BP 136/80   Pulse (!) 104   Temp 97.9 F (36.6 C) (Oral)   Resp 16   Wt 229 lb 3.2 oz (104 kg)   SpO2 98%   BMI 34.85 kg/m       Objective:   Physical Exam  General Mental Status- Alert. General Appearance- Not in acute distress.   Skin General: Color- Normal Color. Moisture- Normal Moisture.  Neck Carotid Arteries- Normal color. Moisture- Normal Moisture. No carotid bruits. No JVD.  Chest and Lung Exam Auscultation: Breath Sounds:-Normal.  Cardiovascular Auscultation:Rythm- Regular. Murmurs & Other Heart Sounds:Auscultation of the heart reveals- No Murmurs.  Abdomen Inspection:-Inspeection Normal. Palpation/Percussion:Note:No mass. Palpation and Percussion of the abdomen reveal- Non Tender, Non Distended + BS, no rebound or guarding.    Neurologic Cranial Nerve exam:- CN III-XII intact(No nystagmus), symmetric smile. Strength:- 5/5 equal and symmetric strength both upper and lower extremities.   Lower ext- no pedal edema. Negative homans signs. Rt  Side lower pretibial area hyperpigmented.       Assessment & Plan:  History of covid infection with respiratory distress. Now recovered adequatlely and no longer requiring oxygen. Will start back to work on Monday part time. Making benzonatate available for cough if needed.   Hx of asthma on review. If recurrent shortness of breath or wheezing start back on pulmicort. Use albuterol if needed.  For htn  and atrial fibrillation continue current bp meds and xarelto.  You do appear to have some hyperpigmentation of rt lower ext from prior skin infection with stasis dermatitis changes. Watch area if gets more red or warm the will give antibiotic. I don't think indicated presently.  Follow up as regularly scheduled with pcp or as needed  Esperanza Richters, PA-C

## 2019-11-14 NOTE — Patient Instructions (Addendum)
History of covid infection with respiratory distress. Now recovered adequatlely and no longer requiring oxygen. Will start back to work on Monday part time. Making benzonatate available for cough if needed.   Hx of asthma on review. If recurrent shortness of breath or wheezing start back on pulmicort. Use albuterol if needed.  For htn and atrial fibrillation continue current bp meds and xarelto.  You do appear to have some hyperpigmentation of rt lower ext from prior skin infection with stasis dermatitis changes. Watch area if gets more red or warm the will give antibiotic. I don't think indicated presently.  Follow up as regularly scheduled with pcp or as needed

## 2019-11-14 NOTE — Telephone Encounter (Signed)
Appt scheduled with edward today at 4 pm

## 2019-11-17 NOTE — Telephone Encounter (Signed)
As far as I can tell she was givent he 100 mg elsewhere and we still had the 50 mg still in her chart so when the pharmacy asked for the 50 mg refill we did not question it. If she has been taking the 100 mg and her bp and pulse have been normal. I am willing to prescribe that just confirm if she want #30 with 2 rf or #90 with 0 rf. Then update MAR and make sure she has an appointment soon

## 2019-11-18 NOTE — Telephone Encounter (Signed)
Medication reconciliation completed this morning with pt. Both meds regulate BP so Metoprolol was decreased  Pt taking :  Metoprolol 50 ER now  Diltiazem HCL (Tiazac)

## 2019-11-18 NOTE — Telephone Encounter (Signed)
Pt contacted informed of medication change d/t pharmacy and different med orders from different providers. Medication reconcilliation completed via phone this AM

## 2019-11-21 ENCOUNTER — Telehealth: Payer: Self-pay | Admitting: Medical

## 2019-11-21 ENCOUNTER — Telehealth: Payer: Self-pay | Admitting: Family Medicine

## 2019-11-21 NOTE — Telephone Encounter (Signed)
I typed the my chart letter return to work note after hours. Sent to pt pharmacy.

## 2019-11-21 NOTE — Telephone Encounter (Signed)
Patient states she need a letter stating she could go back to work with no restriction starting Monday 11/24/19. Please call her when ready

## 2019-11-21 NOTE — Telephone Encounter (Signed)
Sent my chart return to work note on Nov 24, 2019 with no restriction per pt request.

## 2019-11-25 ENCOUNTER — Ambulatory Visit (INDEPENDENT_AMBULATORY_CARE_PROVIDER_SITE_OTHER): Payer: 59 | Admitting: Family Medicine

## 2019-11-25 ENCOUNTER — Other Ambulatory Visit: Payer: Self-pay

## 2019-11-25 VITALS — BP 126/74 | HR 88 | Temp 97.7°F | Resp 18 | Wt 229.4 lb

## 2019-11-25 DIAGNOSIS — U071 COVID-19: Secondary | ICD-10-CM

## 2019-11-25 DIAGNOSIS — Z23 Encounter for immunization: Secondary | ICD-10-CM | POA: Diagnosis not present

## 2019-11-25 DIAGNOSIS — E785 Hyperlipidemia, unspecified: Secondary | ICD-10-CM

## 2019-11-25 DIAGNOSIS — R739 Hyperglycemia, unspecified: Secondary | ICD-10-CM

## 2019-11-25 DIAGNOSIS — R000 Tachycardia, unspecified: Secondary | ICD-10-CM

## 2019-11-25 DIAGNOSIS — J45909 Unspecified asthma, uncomplicated: Secondary | ICD-10-CM

## 2019-11-25 DIAGNOSIS — R109 Unspecified abdominal pain: Secondary | ICD-10-CM

## 2019-11-25 DIAGNOSIS — I10 Essential (primary) hypertension: Secondary | ICD-10-CM

## 2019-11-25 DIAGNOSIS — Z0000 Encounter for general adult medical examination without abnormal findings: Secondary | ICD-10-CM

## 2019-11-25 LAB — COMPREHENSIVE METABOLIC PANEL
ALT: 12 U/L (ref 0–35)
AST: 16 U/L (ref 0–37)
Albumin: 4.2 g/dL (ref 3.5–5.2)
Alkaline Phosphatase: 41 U/L (ref 39–117)
BUN: 16 mg/dL (ref 6–23)
CO2: 30 mEq/L (ref 19–32)
Calcium: 9.9 mg/dL (ref 8.4–10.5)
Chloride: 101 mEq/L (ref 96–112)
Creatinine, Ser: 0.64 mg/dL (ref 0.40–1.20)
GFR: 95.93 mL/min (ref >60.00)
Glucose, Bld: 87 mg/dL (ref 70–99)
Potassium: 3.9 mEq/L (ref 3.5–5.1)
Sodium: 139 mEq/L (ref 135–145)
Total Bilirubin: 0.5 mg/dL (ref 0.2–1.2)
Total Protein: 7.2 g/dL (ref 6.0–8.3)

## 2019-11-25 LAB — CBC WITH DIFFERENTIAL/PLATELET
Basophils Absolute: 0.1 10*3/uL (ref 0.0–0.1)
Basophils Relative: 1 % (ref 0.0–3.0)
Eosinophils Absolute: 0.3 10*3/uL (ref 0.0–0.7)
Eosinophils Relative: 2.5 % (ref 0.0–5.0)
HCT: 38.9 % (ref 36.0–46.0)
Hemoglobin: 13 g/dL (ref 12.0–15.0)
Lymphocytes Relative: 21.3 % (ref 12.0–46.0)
Lymphs Abs: 2.1 10*3/uL (ref 0.7–4.0)
MCHC: 33.5 g/dL (ref 30.0–36.0)
MCV: 94.1 fl (ref 78.0–100.0)
Monocytes Absolute: 1 10*3/uL (ref 0.1–1.0)
Monocytes Relative: 9.7 % (ref 3.0–12.0)
Neutro Abs: 6.6 10*3/uL (ref 1.4–7.7)
Neutrophils Relative %: 65.5 % (ref 43.0–77.0)
Platelets: 478 10*3/uL — ABNORMAL HIGH (ref 150.0–400.0)
RBC: 4.13 Mil/uL (ref 3.87–5.11)
RDW: 13.3 % (ref 11.5–15.5)
WBC: 10 10*3/uL (ref 4.0–10.5)

## 2019-11-25 LAB — HEMOGLOBIN A1C: Hgb A1c MFr Bld: 4.4 % — ABNORMAL LOW (ref 4.6–6.5)

## 2019-11-25 LAB — LIPID PANEL
Cholesterol: 179 mg/dL (ref 0–200)
HDL: 40.9 mg/dL (ref >39.00)
NonHDL: 138.12
Total CHOL/HDL Ratio: 4
Triglycerides: 215 mg/dL — ABNORMAL HIGH (ref 0.0–149.0)
VLDL: 43 mg/dL — ABNORMAL HIGH (ref 0.0–40.0)

## 2019-11-25 LAB — URINALYSIS
Bilirubin Urine: NEGATIVE
Hgb urine dipstick: NEGATIVE
Ketones, ur: NEGATIVE
Leukocytes,Ua: NEGATIVE
Nitrite: NEGATIVE
Specific Gravity, Urine: >=1.03 — AB (ref 1.000–1.030)
Total Protein, Urine: NEGATIVE
Urine Glucose: NEGATIVE
Urobilinogen, UA: 0.2 (ref 0.0–1.0)
pH: 5.5 (ref 5.0–8.0)

## 2019-11-25 LAB — SEDIMENTATION RATE: Sed Rate: 25 mm/hr (ref 0–30)

## 2019-11-25 LAB — TSH: TSH: 1.24 u[IU]/mL (ref 0.35–4.50)

## 2019-11-25 LAB — LDL CHOLESTEROL, DIRECT: Direct LDL: 101 mg/dL

## 2019-11-25 NOTE — Patient Instructions (Signed)
COVID-19 COVID-19 is a respiratory infection that is caused by a virus called severe acute respiratory syndrome coronavirus 2 (SARS-CoV-2). The disease is also known as coronavirus disease or novel coronavirus. In some people, the virus may not cause any symptoms. In others, it may cause a serious infection. The infection can get worse quickly and can lead to complications, such as:  Pneumonia, or infection of the lungs.  Acute respiratory distress syndrome or ARDS. This is a condition in which fluid build-up in the lungs prevents the lungs from filling with air and passing oxygen into the blood.  Acute respiratory failure. This is a condition in which there is not enough oxygen passing from the lungs to the body or when carbon dioxide is not passing from the lungs out of the body.  Sepsis or septic shock. This is a serious bodily reaction to an infection.  Blood clotting problems.  Secondary infections due to bacteria or fungus.  Organ failure. This is when your body's organs stop working. The virus that causes COVID-19 is contagious. This means that it can spread from person to person through droplets from coughs and sneezes (respiratory secretions). What are the causes? This illness is caused by a virus. You may catch the virus by:  Breathing in droplets from an infected person. Droplets can be spread by a person breathing, speaking, singing, coughing, or sneezing.  Touching something, like a table or a doorknob, that was exposed to the virus (contaminated) and then touching your mouth, nose, or eyes. What increases the risk? Risk for infection You are more likely to be infected with this virus if you:  Are within 6 feet (2 meters) of a person with COVID-19.  Provide care for or live with a person who is infected with COVID-19.  Spend time in crowded indoor spaces or live in shared housing. Risk for serious illness You are more likely to become seriously ill from the virus if you:   Are 50 years of age or older. The higher your age, the more you are at risk for serious illness.  Live in a nursing home or long-term care facility.  Have cancer.  Have a long-term (chronic) disease such as: ? Chronic lung disease, including chronic obstructive pulmonary disease or asthma. ? A long-term disease that lowers your body's ability to fight infection (immunocompromised). ? Heart disease, including heart failure, a condition in which the arteries that lead to the heart become narrow or blocked (coronary artery disease), a disease which makes the heart muscle thick, weak, or stiff (cardiomyopathy). ? Diabetes. ? Chronic kidney disease. ? Sickle cell disease, a condition in which red blood cells have an abnormal "sickle" shape. ? Liver disease.  Are obese. What are the signs or symptoms? Symptoms of this condition can range from mild to severe. Symptoms may appear any time from 2 to 14 days after being exposed to the virus. They include:  A fever or chills.  A cough.  Difficulty breathing.  Headaches, body aches, or muscle aches.  Runny or stuffy (congested) nose.  A sore throat.  New loss of taste or smell. Some people may also have stomach problems, such as nausea, vomiting, or diarrhea. Other people may not have any symptoms of COVID-19. How is this diagnosed? This condition may be diagnosed based on:  Your signs and symptoms, especially if: ? You live in an area with a COVID-19 outbreak. ? You recently traveled to or from an area where the virus is common. ? You   provide care for or live with a person who was diagnosed with COVID-19. ? You were exposed to a person who was diagnosed with COVID-19.  A physical exam.  Lab tests, which may include: ? Taking a sample of fluid from the back of your nose and throat (nasopharyngeal fluid), your nose, or your throat using a swab. ? A sample of mucus from your lungs (sputum). ? Blood tests.  Imaging tests, which  may include, X-rays, CT scan, or ultrasound. How is this treated? At present, there is no medicine to treat COVID-19. Medicines that treat other diseases are being used on a trial basis to see if they are effective against COVID-19. Your health care provider will talk with you about ways to treat your symptoms. For most people, the infection is mild and can be managed at home with rest, fluids, and over-the-counter medicines. Treatment for a serious infection usually takes places in a hospital intensive care unit (ICU). It may include one or more of the following treatments. These treatments are given until your symptoms improve.  Receiving fluids and medicines through an IV.  Supplemental oxygen. Extra oxygen is given through a tube in the nose, a face mask, or a hood.  Positioning you to lie on your stomach (prone position). This makes it easier for oxygen to get into the lungs.  Continuous positive airway pressure (CPAP) or bi-level positive airway pressure (BPAP) machine. This treatment uses mild air pressure to keep the airways open. A tube that is connected to a motor delivers oxygen to the body.  Ventilator. This treatment moves air into and out of the lungs by using a tube that is placed in your windpipe.  Tracheostomy. This is a procedure to create a hole in the neck so that a breathing tube can be inserted.  Extracorporeal membrane oxygenation (ECMO). This procedure gives the lungs a chance to recover by taking over the functions of the heart and lungs. It supplies oxygen to the body and removes carbon dioxide. Follow these instructions at home: Lifestyle  If you are sick, stay home except to get medical care. Your health care provider will tell you how long to stay home. Call your health care provider before you go for medical care.  Rest at home as told by your health care provider.  Do not use any products that contain nicotine or tobacco, such as cigarettes, e-cigarettes, and  chewing tobacco. If you need help quitting, ask your health care provider.  Return to your normal activities as told by your health care provider. Ask your health care provider what activities are safe for you. General instructions  Take over-the-counter and prescription medicines only as told by your health care provider.  Drink enough fluid to keep your urine pale yellow.  Keep all follow-up visits as told by your health care provider. This is important. How is this prevented?  There is no vaccine to help prevent COVID-19 infection. However, there are steps you can take to protect yourself and others from this virus. To protect yourself:   Do not travel to areas where COVID-19 is a risk. The areas where COVID-19 is reported change often. To identify high-risk areas and travel restrictions, check the CDC travel website: wwwnc.cdc.gov/travel/notices  If you live in, or must travel to, an area where COVID-19 is a risk, take precautions to avoid infection. ? Stay away from people who are sick. ? Wash your hands often with soap and water for 20 seconds. If soap and water   are not available, use an alcohol-based hand sanitizer. ? Avoid touching your mouth, face, eyes, or nose. ? Avoid going out in public, follow guidance from your state and local health authorities. ? If you must go out in public, wear a cloth face covering or face mask. Make sure your mask covers your nose and mouth. ? Avoid crowded indoor spaces. Stay at least 6 feet (2 meters) away from others. ? Disinfect objects and surfaces that are frequently touched every day. This may include:  Counters and tables.  Doorknobs and light switches.  Sinks and faucets.  Electronics, such as phones, remote controls, keyboards, computers, and tablets. To protect others: If you have symptoms of COVID-19, take steps to prevent the virus from spreading to others.  If you think you have a COVID-19 infection, contact your health care  provider right away. Tell your health care team that you think you may have a COVID-19 infection.  Stay home. Leave your house only to seek medical care. Do not use public transport.  Do not travel while you are sick.  Wash your hands often with soap and water for 20 seconds. If soap and water are not available, use alcohol-based hand sanitizer.  Stay away from other members of your household. Let healthy household members care for children and pets, if possible. If you have to care for children or pets, wash your hands often and wear a mask. If possible, stay in your own room, separate from others. Use a different bathroom.  Make sure that all people in your household wash their hands well and often.  Cough or sneeze into a tissue or your sleeve or elbow. Do not cough or sneeze into your hand or into the air.  Wear a cloth face covering or face mask. Make sure your mask covers your nose and mouth. Where to find more information  Centers for Disease Control and Prevention: www.cdc.gov/coronavirus/2019-ncov/index.html  World Health Organization: www.who.int/health-topics/coronavirus Contact a health care provider if:  You live in or have traveled to an area where COVID-19 is a risk and you have symptoms of the infection.  You have had contact with someone who has COVID-19 and you have symptoms of the infection. Get help right away if:  You have trouble breathing.  You have pain or pressure in your chest.  You have confusion.  You have bluish lips and fingernails.  You have difficulty waking from sleep.  You have symptoms that get worse. These symptoms may represent a serious problem that is an emergency. Do not wait to see if the symptoms will go away. Get medical help right away. Call your local emergency services (911 in the [place] Do not drive yourself to the hospital. Let the emergency medical personnel know if you think you have COVID-19. Summary  COVID-19 is a  respiratory infection that is caused by a virus. It is also known as coronavirus disease or novel coronavirus. It can cause serious infections, such as pneumonia, acute respiratory distress syndrome, acute respiratory failure, or sepsis.  The virus that causes COVID-19 is contagious. This means that it can spread from person to person through droplets from breathing, speaking, singing, coughing, or sneezing.  You are more likely to develop a serious illness if you are 50 years of age or older, have a weak immune system, live in a nursing home, or have chronic disease.  There is no medicine to treat COVID-19. Your health care provider will talk with you about ways to treat your symptoms.    Take steps to protect yourself and others from infection. Wash your hands often and disinfect objects and surfaces that are frequently touched every day. Stay away from people who are sick and wear a mask if you are sick. This information is not intended to replace advice given to you by your health care provider. Make sure you discuss any questions you have with your health care provider. Document Revised: 10/25/2018 Document Reviewed: 01/31/2018 Elsevier Patient Education  2020 [employer]  

## 2019-11-25 NOTE — Assessment & Plan Note (Signed)
Well controlled, no changes to meds. Encouraged heart healthy diet such as the DASH diet and exercise as tolerated.  °

## 2019-11-26 DIAGNOSIS — R739 Hyperglycemia, unspecified: Secondary | ICD-10-CM | POA: Insufficient documentation

## 2019-11-26 LAB — URINE CULTURE
MICRO NUMBER:: 11209340
SPECIMEN QUALITY:: ADEQUATE

## 2019-11-26 NOTE — Assessment & Plan Note (Signed)
Improved with rest

## 2019-11-26 NOTE — Assessment & Plan Note (Signed)
She is doing much better and she is managing on Pulmicort and using Albuterol infrequently.

## 2019-11-26 NOTE — Assessment & Plan Note (Signed)
hgba1c acceptable, minimize simple carbs. Increase exercise as tolerated.  

## 2019-11-26 NOTE — Assessment & Plan Note (Signed)
Encouraged heart healthy diet, increase exercise, avoid trans fats, consider a krill oil cap daily 

## 2019-11-26 NOTE — Assessment & Plan Note (Signed)
She had a severe case of COVID in August. She was hospitalized and nearly intubated. She has had a very slow improvement but is finally mostly back to her baseline health. She returned to work last week 1/2 time and was able to return to work full time yesterday.

## 2019-11-26 NOTE — Progress Notes (Signed)
Subjective:    Patient ID: Kathleen Church, female    DOB: January 05, 1961, 59 y.o.   MRN: [id number]  Chief Complaint  Patient presents with  . Follow-up    HPI Patient is in today for follow up on chronic medical concerns. She is doing well after taking months to recover from COVID. She started back to work full time yesterday and tolerated it well. She is still struggling with some DOE but it is greatly improved. She is trying to eat well and gradually increase activity levels. Denies CP/palp/HA/congestion/fevers/GI or GU c/o. Taking meds as prescribed  Past Medical History:  Diagnosis Date  . Allergic state   . Anxiety    when father passed  . Arthritis   . Asthma, mild persistent    allergy induced asthma   . Cervical cancer screening 04/27/2011  . Edema 04/27/2011  . Heart murmur    as an infant   . Hyperlipidemia 04/27/2011  . Hypertension 2010  . Hypokalemia 04/27/2011  . Measles as a child  . Mumps as a child  . Muscle cramp 10/14/2015  . Obesity   . Osteoarthritis of left knee 10/14/2015  . Plantar fasciitis, left 01/19/2013  . Preventative health care 02/13/2011  . Sinusitis 02/13/2011    Past Surgical History:  Procedure Laterality Date  . ENDOMETRIAL ABLATION  2006   menorraghia  . KNEE SURGERY  2006   left knee  . TONSILLECTOMY    . 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 TUBAL LIGATION      Family History  Problem Relation Age of Onset  . Cancer Father        kidney  . Cancer Maternal Grandmother        bone  . Factor V Leiden deficiency Maternal Grandmother   . Cancer Paternal Grandmother        breast  . Breast cancer Paternal Grandmother   . Asthma Daughter   . Heart attack Paternal Grandfather   . Cancer Mother 59       metastatic lung cancer  . Stroke Mother   . Asthma Daughter   . Factor V Leiden deficiency  Maternal Aunt     Social History   Socioeconomic History  . Marital status: Widowed    Spouse name: Not on file  . Number of children: Not on file  . Years of education: Not on file  . Highest education level: Not on file  Occupational History  . Not on file  Tobacco Use  . Smoking status: Never Smoker  . Smokeless tobacco: Never Used  Substance and Sexual Activity  . Alcohol use: Yes    Comment: occas   . Drug use: No  . Sexual activity: Yes    Partners: Male  Other Topics Concern  . Not on file  Social History Narrative  . Not on file   Social Determinants of Health   Financial Resource Strain:   . Difficulty of Paying Living Expenses: Not on file  Food Insecurity:   . Worried About Programme researcher, broadcasting/film/video in the Last Year: Not on file  . Ran Out of Food in the Last Year: Not on file  Transportation Needs:   . Lack of Transportation (Medical): Not on file  . Lack of Transportation (Non-Medical): Not on file  Physical Activity:   . Days of Exercise per  Week: Not on file  . Minutes of Exercise per Session: Not on file  Stress:   . Feeling of Stress : Not on file  Social Connections:   . Frequency of Communication with Friends and Family: Not on file  . Frequency of Social Gatherings with Friends and Family: Not on file  . Attends Religious Services: Not on file  . Active Member of Clubs or Organizations: Not on file  . Attends BankerClub or Organization Meetings: Not on file  . Marital Status: Not on file  Intimate Partner Violence:   . Fear of Current or Ex-Partner: Not on file  . Emotionally Abused: Not on file  . Physically Abused: Not on file  . Sexually Abused: Not on file    Outpatient Medications Prior to Visit  Medication Sig Dispense Refill  . albuterol (PROAIR HFA) 108 (90 Base) MCG/ACT inhaler TAKE 2 PUFFS BY MOUTH EVERY 6 HOURS AS NEEDED FOR WHEEZE OR SHORTNESS OF BREATH 18 g 3  . ALPRAZolam (XANAX) 0.25 MG tablet Take 1 tablet (0.25 mg total) by mouth 2  (two) times daily as needed for anxiety. 5 tablet 0  . aspirin EC 325 MG tablet Take 1 tablet (325 mg total) by mouth 2 (two) times daily. 60 tablet 0  . benzonatate (TESSALON) 100 MG capsule Take 1 capsule (100 mg total) by mouth 3 (three) times daily as needed. 30 capsule 0  . budesonide (PULMICORT) 180 MCG/ACT inhaler Inhale 2 puffs into the lungs 2 (two) times daily. 1 each 3  . cephALEXin (KEFLEX) 500 MG capsule Take 1 capsule (500 mg total) by mouth 4 (four) times daily. 40 capsule 0  . diltiazem (TIAZAC) 120 MG 24 hr capsule Take by mouth.    . fenofibrate (TRICOR) 145 MG tablet TAKE 1 TABLET BY MOUTH EVERY DAY 90 tablet 1  . fluticasone (FLONASE) 50 MCG/ACT nasal spray Place 2 sprays into both nostrils daily. 16 g 1  . levocetirizine (XYZAL) 5 MG tablet Take 1 tablet (5 mg total) by mouth every evening. 30 tablet 5  . metoprolol succinate (TOPROL-XL) 50 MG 24 hr tablet TAKE 1 TABLET (50 MG TOTAL) BY MOUTH DAILY. TAKE WITH OR IMMEDIATELY FOLLOWING A MEAL. 90 tablet 1  . montelukast (SINGULAIR) 10 MG tablet TAKE 1 TABLET BY MOUTH EVERYDAY AT BEDTIME 90 tablet 1  . rivaroxaban (XARELTO) 20 MG TABS tablet Take by mouth.    . valsartan-hydrochlorothiazide (DIOVAN-HCT) 160-12.5 MG tablet TAKE 1 TABLET BY MOUTH EVERY DAY 90 tablet 1  . amoxicillin-clavulanate (AUGMENTIN) 875-125 MG tablet Take 1 tablet by mouth 2 (two) times daily. 14 tablet 0  . HYDROcodone-homatropine (HYCODAN) 5-1.5 MG/5ML syrup Take 5 mLs by mouth every 8 (eight) hours as needed for cough. 150 mL 0  . metoprolol succinate (TOPROL-XL) 100 MG 24 hr tablet Take by mouth.     No facility-administered medications prior to visit.    Allergies  Allergen Reactions  . Codeine Palpitations and Rash    Review of Systems  Constitutional: Negative for fever and malaise/fatigue.  HENT: Negative for congestion.   Eyes: Negative for blurred vision.  Respiratory: Negative for shortness of breath.   Cardiovascular: Negative for  chest pain, palpitations and leg swelling.  Gastrointestinal: Negative for abdominal pain, blood in stool and nausea.  Genitourinary: Negative for dysuria and frequency.  Musculoskeletal: Negative for falls.  Skin: Negative for rash.  Neurological: Negative for dizziness, loss of consciousness and headaches.  Endo/Heme/Allergies: Negative for environmental allergies.  Psychiatric/Behavioral: Negative for  depression. The patient is not nervous/anxious.        Objective:    Physical Exam Vitals and nursing note reviewed.  Constitutional:      General: She is not in acute distress.    Appearance: She is well-developed.  HENT:     Head: Normocephalic and atraumatic.     Nose: Nose normal.  Eyes:     General:        Right eye: No discharge.        Left eye: No discharge.  Cardiovascular:     Rate and Rhythm: Normal rate and regular rhythm.     Heart sounds: No murmur heard.   Pulmonary:     Effort: Pulmonary effort is normal.     Breath sounds: Normal breath sounds.  Abdominal:     General: Bowel sounds are normal.     Palpations: Abdomen is soft.     Tenderness: There is no abdominal tenderness.  Musculoskeletal:     Cervical back: Normal range of motion and neck supple.  Skin:    General: Skin is warm and dry.  Neurological:     Mental Status: She is alert and oriented to person, place, and time.     BP 126/74   Pulse 88   Temp 97.7 F (36.5 C) (Oral)   Resp 18   Wt 229 lb 6.4 oz (104.1 kg)   SpO2 97%   BMI 34.88 kg/m  Wt Readings from Last 3 Encounters:  11/25/19 229 lb 6.4 oz (104.1 kg)  11/14/19 229 lb 3.2 oz (104 kg)  10/21/19 238 lb 6.4 oz (108.1 kg)    Diabetic Foot Exam - Simple   No data filed     Lab Results  Component Value Date   WBC 10.0 11/25/2019   HGB 13.0 11/25/2019   HCT 38.9 11/25/2019   PLT 478.0 (H) 11/25/2019   GLUCOSE 87 11/25/2019   CHOL 179 11/25/2019   TRIG 215.0 (H) 11/25/2019   HDL 40.90 11/25/2019   LDLDIRECT 101.0  11/25/2019   LDLCALC 100 (H) 12/02/2018   ALT 12 11/25/2019   AST 16 11/25/2019   NA 139 11/25/2019   K 3.9 11/25/2019   CL 101 11/25/2019   CREATININE 0.64 11/25/2019   BUN 16 11/25/2019   CO2 30 11/25/2019   TSH 1.24 11/25/2019   INR 1.03 10/08/2015   HGBA1C 4.4 (L) 11/25/2019    Lab Results  Component Value Date   TSH 1.24 11/25/2019   Lab Results  Component Value Date   WBC 10.0 11/25/2019   HGB 13.0 11/25/2019   HCT 38.9 11/25/2019   MCV 94.1 11/25/2019   PLT 478.0 (H) 11/25/2019   Lab Results  Component Value Date   NA 139 11/25/2019   K 3.9 11/25/2019   CO2 30 11/25/2019   GLUCOSE 87 11/25/2019   BUN 16 11/25/2019   CREATININE 0.64 11/25/2019   BILITOT 0.5 11/25/2019   ALKPHOS 41 11/25/2019   AST 16 11/25/2019   ALT 12 11/25/2019   PROT 7.2 11/25/2019   ALBUMIN 4.2 11/25/2019   CALCIUM 9.9 11/25/2019   ANIONGAP 7 10/16/2015   GFR 95.93 11/25/2019   Lab Results  Component Value Date   CHOL 179 11/25/2019   Lab Results  Component Value Date   HDL 40.90 11/25/2019   Lab Results  Component Value Date   LDLCALC 100 (H) 12/02/2018   Lab Results  Component Value Date   TRIG 215.0 (H) 11/25/2019   Lab  Results  Component Value Date   CHOLHDL 4 11/25/2019   Lab Results  Component Value Date   HGBA1C 4.4 (L) 11/25/2019       Assessment & Plan:   Problem List Items Addressed This Visit    Hypertension    Well controlled, no changes to meds. Encouraged heart healthy diet such as the DASH diet and exercise as tolerated.       Relevant Orders   CBC with Differential/Platelet (Completed)   Comprehensive metabolic panel (Completed)   TSH (Completed)   Preventative health care   Asthma    She is doing much better and she is managing on Pulmicort and using Albuterol infrequently.       Hyperlipidemia    Encouraged heart healthy diet, increase exercise, avoid trans fats, consider a krill oil cap daily      Relevant Orders   Lipid panel  (Completed)   Tachycardia    Improved with rest      COVID-19    She had a severe case of COVID in August. She was hospitalized and nearly intubated. She has had a very slow improvement but is finally mostly back to her baseline health. She returned to work last week 1/2 time and was able to return to work full time yesterday.       Hyperglycemia    hgba1c acceptable, minimize simple carbs. Increase exercise as tolerated.       Relevant Orders   Hemoglobin A1c (Completed)    Other Visit Diagnoses    Need for influenza vaccination    -  Primary   Relevant Orders   Flu Vaccine QUAD 36+ mos IM (Fluarix, Fluzone & Afluria Quad PF (Completed)   Abdominal pain, unspecified abdominal location       Relevant Orders   Urinalysis (Completed)   Urine Culture (Completed)   Sedimentation rate (Completed)      I have discontinued Tammi Sou. Rolison's HYDROcodone-homatropine and amoxicillin-clavulanate. I am also having her maintain her aspirin EC, levocetirizine, fluticasone, ALPRAZolam, valsartan-hydrochlorothiazide, albuterol, budesonide, rivaroxaban, fenofibrate, diltiazem, metoprolol succinate, cephALEXin, montelukast, metoprolol succinate, and benzonatate.  No orders of the defined types were placed in this encounter.    Danise Edge, MD

## 2019-12-05 ENCOUNTER — Other Ambulatory Visit: Payer: Self-pay | Admitting: Family Medicine

## 2019-12-05 DIAGNOSIS — R059 Cough, unspecified: Secondary | ICD-10-CM

## 2019-12-09 ENCOUNTER — Encounter: Payer: Self-pay | Admitting: Family Medicine

## 2019-12-09 ENCOUNTER — Other Ambulatory Visit: Payer: Self-pay

## 2019-12-09 ENCOUNTER — Ambulatory Visit (INDEPENDENT_AMBULATORY_CARE_PROVIDER_SITE_OTHER): Payer: 59 | Admitting: Family Medicine

## 2019-12-09 VITALS — BP 132/78 | HR 103 | Temp 97.9°F | Resp 16 | Wt 227.8 lb

## 2019-12-09 DIAGNOSIS — E6609 Other obesity due to excess calories: Secondary | ICD-10-CM | POA: Diagnosis not present

## 2019-12-09 DIAGNOSIS — I10 Essential (primary) hypertension: Secondary | ICD-10-CM | POA: Diagnosis not present

## 2019-12-09 DIAGNOSIS — R739 Hyperglycemia, unspecified: Secondary | ICD-10-CM

## 2019-12-09 DIAGNOSIS — E785 Hyperlipidemia, unspecified: Secondary | ICD-10-CM | POA: Diagnosis not present

## 2019-12-09 DIAGNOSIS — D75839 Thrombocytosis, unspecified: Secondary | ICD-10-CM | POA: Insufficient documentation

## 2019-12-09 DIAGNOSIS — Z0000 Encounter for general adult medical examination without abnormal findings: Secondary | ICD-10-CM | POA: Diagnosis not present

## 2019-12-09 NOTE — Assessment & Plan Note (Signed)
Encouraged DASH diet, decrease po intake and increase exercise as tolerated. Needs 7-8 hours of sleep nightly. Avoid trans fats, eat small, frequent meals every 4-5 hours with lean proteins, complex carbs and healthy fats. Minimize simple carbs 

## 2019-12-09 NOTE — Assessment & Plan Note (Signed)
Well controlled, no changes to meds. Encouraged heart healthy diet such as the DASH diet and exercise as tolerated.  °

## 2019-12-09 NOTE — Assessment & Plan Note (Signed)
Mild, asymptomatic. Continue to monitor

## 2019-12-09 NOTE — Patient Instructions (Signed)
MIND diet   [facility] [phone] for covid shots Preventive Care 75-60 Years Old, Female Preventive care refers to visits with your health care provider and lifestyle choices that can promote health and wellness. This includes:  A yearly physical exam. This may also be called an annual well check.  Regular dental visits and eye exams.  Immunizations.  Screening for certain conditions.  Healthy lifestyle choices, such as eating a healthy diet, getting regular exercise, not using drugs or products that contain nicotine and tobacco, and limiting alcohol use. What can I expect for my preventive care visit? Physical exam Your health care provider will check your:  Height and weight. This may be used to calculate body mass index (BMI), which tells if you are at a healthy weight.  Heart rate and blood pressure.  Skin for abnormal spots. Counseling Your health care provider may ask you questions about your:  Alcohol, tobacco, and drug use.  Emotional well-being.  Home and relationship well-being.  Sexual activity.  Eating habits.  Work and work Statistician.  Method of birth control.  Menstrual cycle.  Pregnancy history. What immunizations do I need?  Influenza (flu) vaccine  This is recommended every year. Tetanus, diphtheria, and pertussis (Tdap) vaccine  You may need a Td booster every 10 years. Varicella (chickenpox) vaccine  You may need this if you have not been vaccinated. Zoster (shingles) vaccine  You may need this after age 94. Measles, mumps, and rubella (MMR) vaccine  You may need at least one dose of MMR if you were born in 1957 or later. You may also need a second dose. Pneumococcal conjugate (PCV13) vaccine  You may need this if you have certain conditions and were not previously vaccinated. Pneumococcal polysaccharide (PPSV23) vaccine  You may need one or two doses if you smoke cigarettes or if you have certain conditions. Meningococcal  conjugate (MenACWY) vaccine  You may need this if you have certain conditions. Hepatitis A vaccine  You may need this if you have certain conditions or if you travel or work in places where you may be exposed to hepatitis A. Hepatitis B vaccine  You may need this if you have certain conditions or if you travel or work in places where you may be exposed to hepatitis B. Haemophilus influenzae type b (Hib) vaccine  You may need this if you have certain conditions. Human papillomavirus (HPV) vaccine  If recommended by your health care provider, you may need three doses over 6 months. You may receive vaccines as individual doses or as more than one vaccine together in one shot (combination vaccines). Talk with your health care provider about the risks and benefits of combination vaccines. What tests do I need? Blood tests  Lipid and cholesterol levels. These may be checked every 5 years, or more frequently if you are over 60 years old.  Hepatitis C test.  Hepatitis B test. Screening  Lung cancer screening. You may have this screening every year starting at age 60 if you have a 30-pack-year history of smoking and currently smoke or have quit within the past 15 years.  Colorectal cancer screening. All adults should have this screening starting at age 60 and continuing until age 74. Your health care provider may recommend screening at age 60 if you are at increased risk. You will have tests every 1-10 years, depending on your results and the type of screening test.  Diabetes screening. This is done by checking your blood sugar (glucose) after  you have not eaten for a while (fasting). You may have this done every 1-3 years.  Mammogram. This may be done every 1-2 years. Talk with your health care provider about when you should start having regular mammograms. This may depend on whether you have a family history of breast cancer.  BRCA-related cancer screening. This may be done if you have a  family history of breast, ovarian, tubal, or peritoneal cancers.  Pelvic exam and Pap test. This may be done every 3 years starting at age 60. Starting at age 60, this may be done every 5 years if you have a Pap test in combination with an HPV test. Other tests  Sexually transmitted disease (STD) testing.  Bone density scan. This is done to screen for osteoporosis. You may have this scan if you are at high risk for osteoporosis. Follow these instructions at home: Eating and drinking  Eat a diet that includes fresh fruits and vegetables, whole grains, lean protein, and low-fat dairy.  Take vitamin and mineral supplements as recommended by your health care provider.  Do not drink alcohol if: ? Your health care provider tells you not to drink. ? You are pregnant, may be pregnant, or are planning to become pregnant.  If you drink alcohol: ? Limit how much you have to 0-1 drink a day. ? Be aware of how much alcohol is in your drink. In the [place] one drink equals one 12 oz bottle of beer (355 mL), one 5 oz glass of wine (148 mL), or one 1 oz glass of hard liquor (44 mL). Lifestyle  Take daily care of your teeth and gums.  Stay active. Exercise for at least 30 minutes on 5 or more days each week.  Do not use any products that contain nicotine or tobacco, such as cigarettes, e-cigarettes, and chewing tobacco. If you need help quitting, ask your health care provider.  If you are sexually active, practice safe sex. Use a condom or other form of birth control (contraception) in order to prevent pregnancy and STIs (sexually transmitted infections).  If told by your health care provider, take low-dose aspirin daily starting at age 60. What's next?  Visit your health care provider once a year for a well check visit.  Ask your health care provider how often you should have your eyes and teeth checked.  Stay up to date on all vaccines. This information is not intended to replace advice given  to you by your health care provider. Make sure you discuss any questions you have with your health care provider. Document Revised: 09/06/2017 Document Reviewed: 09/06/2017 Elsevier Patient Education  2020 [employer].

## 2019-12-09 NOTE — Assessment & Plan Note (Signed)
Patient encouraged to maintain heart healthy diet, regular exercise, adequate sleep. Consider daily probiotics. Take medications as prescribed. Labs reviewed. Immunizations UTD has recently had covid so will wait for shots til 90 days after infection

## 2019-12-09 NOTE — Progress Notes (Signed)
Subjective:    Patient ID: Kathleen Church, female    DOB: [birth year], 59 y.o.   MRN: [id number]  No chief complaint on file.   HPI Patient is in today for annual preventative exam and follow-up on chronic medical concerns. She returned to work last week after a long bout with Covid and while she does acknowledge increasing fatigue she does report otherwise she is managed it well. She is working from home so is able to accommodate her decreased energy level. No flare in her shortness of breath or respiratory symptoms was noted. Overall she feels she is doing well and denies any other recent febrile illness or hospitalizations. She has not begun exercising again but is ready to start soon she reports. She is trying to maintain a heart healthy diet and minimize her carbohydrates. She has not had a Covid vaccines yet but does agree to get them in another month or 2 after she has recovered more fully from her Covid experience. She does not remember the first few days in the hospital as she was so sick but she had significant mental status changes and is very worried about the possibility of becoming sick once more. Denies CP/palp/SOB/HA/congestion/fevers/GI or GU c/o. Taking meds as prescribed Past Medical History:  Diagnosis Date  . Allergic state   . Anxiety    when father passed  . Arthritis   . Asthma, mild persistent    allergy induced asthma   . Cervical cancer screening 04/27/2011  . Edema 04/27/2011  . Heart murmur    as an infant   . Hyperlipidemia 04/27/2011  . Hypertension 2010  . Hypokalemia 04/27/2011  . Measles as a child  . Mumps as a child  . Muscle cramp 10/14/2015  . Obesity   . Osteoarthritis of left knee 10/14/2015  . Plantar fasciitis, left 01/19/2013  . Preventative health care 02/13/2011  . Sinusitis 02/13/2011    Past Surgical History:  Procedure Laterality Date  . ENDOMETRIAL ABLATION  2006   menorraghia  . KNEE SURGERY  2006   left knee  . TONSILLECTOMY    .  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 TUBAL LIGATION      Family History  Problem Relation Age of Onset  . Cancer Father        kidney  . Cancer Maternal Grandmother        bone  . Factor V Leiden deficiency Maternal Grandmother   . Cancer Paternal Grandmother        breast  . Breast cancer Paternal Grandmother   . Asthma Daughter   . Heart attack Paternal Grandfather   . Cancer Mother 51       metastatic lung cancer  . Stroke Mother   . Asthma Daughter   . Factor V Leiden deficiency Maternal Aunt     Social History   Socioeconomic History  . Marital status: Widowed    Spouse name: Not on file  . Number of children: Not on file  . Years of education: Not on file  . Highest education level: Not on file  Occupational History  . Not on file  Tobacco Use  . Smoking status: Never Smoker  . Smokeless tobacco: Never Used  Substance and Sexual Activity  . Alcohol use: Yes    Comment: occas   . Drug use: No  .  Sexual activity: Yes    Partners: Male  Other Topics Concern  . Not on file  Social History Narrative  . Not on file   Social Determinants of Health   Financial Resource Strain:   . Difficulty of Paying Living Expenses: Not on file  Food Insecurity:   . Worried About Programme researcher, broadcasting/film/video in the Last Year: Not on file  . Ran Out of Food in the Last Year: Not on file  Transportation Needs:   . Lack of Transportation (Medical): Not on file  . Lack of Transportation (Non-Medical): Not on file  Physical Activity:   . Days of Exercise per Week: Not on file  . Minutes of Exercise per Session: Not on file  Stress:   . Feeling of Stress : Not on file  Social Connections:   . Frequency of Communication with Friends and Family: Not on file  . Frequency of Social Gatherings with Friends and Family: Not on file  . Attends Religious  Services: Not on file  . Active Member of Clubs or Organizations: Not on file  . Attends Banker Meetings: Not on file  . Marital Status: Not on file  Intimate Partner Violence:   . Fear of Current or Ex-Partner: Not on file  . Emotionally Abused: Not on file  . Physically Abused: Not on file  . Sexually Abused: Not on file    Outpatient Medications Prior to Visit  Medication Sig Dispense Refill  . albuterol (VENTOLIN HFA) 108 (90 Base) MCG/ACT inhaler TAKE 2 PUFFS BY MOUTH EVERY 6 HOURS AS NEEDED FOR WHEEZE OR SHORTNESS OF BREATH 18 each 3  . ALPRAZolam (XANAX) 0.25 MG tablet Take 1 tablet (0.25 mg total) by mouth 2 (two) times daily as needed for anxiety. 5 tablet 0  . aspirin EC 325 MG tablet Take 1 tablet (325 mg total) by mouth 2 (two) times daily. 60 tablet 0  . benzonatate (TESSALON) 100 MG capsule Take 1 capsule (100 mg total) by mouth 3 (three) times daily as needed. 30 capsule 0  . budesonide (PULMICORT) 180 MCG/ACT inhaler Inhale 2 puffs into the lungs 2 (two) times daily. 1 each 3  . diltiazem (TIAZAC) 120 MG 24 hr capsule Take by mouth.    . fenofibrate (TRICOR) 145 MG tablet TAKE 1 TABLET BY MOUTH EVERY DAY 90 tablet 1  . fluticasone (FLONASE) 50 MCG/ACT nasal spray Place 2 sprays into both nostrils daily. 16 g 1  . levocetirizine (XYZAL) 5 MG tablet Take 1 tablet (5 mg total) by mouth every evening. 30 tablet 5  . metoprolol succinate (TOPROL-XL) 100 MG 24 hr tablet Take by mouth.    . metoprolol succinate (TOPROL-XL) 50 MG 24 hr tablet TAKE 1 TABLET (50 MG TOTAL) BY MOUTH DAILY. TAKE WITH OR IMMEDIATELY FOLLOWING A MEAL. 90 tablet 1  . montelukast (SINGULAIR) 10 MG tablet TAKE 1 TABLET BY MOUTH EVERYDAY AT BEDTIME 90 tablet 1  . rivaroxaban (XARELTO) 20 MG TABS tablet Take by mouth.    . valsartan-hydrochlorothiazide (DIOVAN-HCT) 160-12.5 MG tablet TAKE 1 TABLET BY MOUTH EVERY DAY 90 tablet 1  . cephALEXin (KEFLEX) 500 MG capsule Take 1 capsule (500 mg total)  by mouth 4 (four) times daily. 40 capsule 0   No facility-administered medications prior to visit.    Allergies  Allergen Reactions  . Codeine Palpitations and Rash    Review of Systems  Constitutional: Negative for chills, fever and malaise/fatigue.  HENT: Negative for congestion and hearing  loss.   Eyes: Negative for discharge.  Respiratory: Negative for cough, sputum production and shortness of breath.   Cardiovascular: Negative for chest pain, palpitations and leg swelling.  Gastrointestinal: Negative for abdominal pain, blood in stool, constipation, diarrhea, heartburn, nausea and vomiting.  Genitourinary: Negative for dysuria, frequency and urgency.  Musculoskeletal: Negative for back pain, falls and myalgias.  Skin: Negative for rash.  Neurological: Negative for dizziness, sensory change, loss of consciousness, weakness and headaches.  Endo/Heme/Allergies: Negative for environmental allergies. Does not bruise/bleed easily.  Psychiatric/Behavioral: Negative for depression and suicidal ideas. The patient is not nervous/anxious and does not have insomnia.        Objective:    Physical Exam Constitutional:      General: She is not in acute distress.    Appearance: She is well-developed.  HENT:     Head: Normocephalic and atraumatic.  Eyes:     Conjunctiva/sclera: Conjunctivae normal.  Neck:     Thyroid: No thyromegaly.  Cardiovascular:     Rate and Rhythm: Normal rate and regular rhythm.     Heart sounds: Normal heart sounds. No murmur heard.   Pulmonary:     Effort: Pulmonary effort is normal. No respiratory distress.     Breath sounds: Normal breath sounds.  Abdominal:     General: Bowel sounds are normal. There is no distension.     Palpations: Abdomen is soft. There is no mass.     Tenderness: There is no abdominal tenderness.  Musculoskeletal:     Cervical back: Neck supple.  Lymphadenopathy:     Cervical: No cervical adenopathy.  Skin:    General: Skin  is warm and dry.  Neurological:     Mental Status: She is alert and oriented to person, place, and time.  Psychiatric:        Behavior: Behavior normal.     BP 132/78 (BP Location: Left Arm, Patient Position: Sitting, Cuff Size: Large)   Pulse (!) 103   Temp 97.9 F (36.6 C) (Temporal)   Resp 16   Wt 227 lb 12.8 oz (103.3 kg)   SpO2 92%   BMI 34.64 kg/m  Wt Readings from Last 3 Encounters:  12/09/19 227 lb 12.8 oz (103.3 kg)  11/25/19 229 lb 6.4 oz (104.1 kg)  11/14/19 229 lb 3.2 oz (104 kg)    Diabetic Foot Exam - Simple   No data filed     Lab Results  Component Value Date   WBC 10.0 11/25/2019   HGB 13.0 11/25/2019   HCT 38.9 11/25/2019   PLT 478.0 (H) 11/25/2019   GLUCOSE 87 11/25/2019   CHOL 179 11/25/2019   TRIG 215.0 (H) 11/25/2019   HDL 40.90 11/25/2019   LDLDIRECT 101.0 11/25/2019   LDLCALC 100 (H) 12/02/2018   ALT 12 11/25/2019   AST 16 11/25/2019   NA 139 11/25/2019   K 3.9 11/25/2019   CL 101 11/25/2019   CREATININE 0.64 11/25/2019   BUN 16 11/25/2019   CO2 30 11/25/2019   TSH 1.24 11/25/2019   INR 1.03 10/08/2015   HGBA1C 4.4 (L) 11/25/2019    Lab Results  Component Value Date   TSH 1.24 11/25/2019   Lab Results  Component Value Date   WBC 10.0 11/25/2019   HGB 13.0 11/25/2019   HCT 38.9 11/25/2019   MCV 94.1 11/25/2019   PLT 478.0 (H) 11/25/2019   Lab Results  Component Value Date   NA 139 11/25/2019   K 3.9 11/25/2019   CO2  30 11/25/2019   GLUCOSE 87 11/25/2019   BUN 16 11/25/2019   CREATININE 0.64 11/25/2019   BILITOT 0.5 11/25/2019   ALKPHOS 41 11/25/2019   AST 16 11/25/2019   ALT 12 11/25/2019   PROT 7.2 11/25/2019   ALBUMIN 4.2 11/25/2019   CALCIUM 9.9 11/25/2019   ANIONGAP 7 10/16/2015   GFR 95.93 11/25/2019   Lab Results  Component Value Date   CHOL 179 11/25/2019   Lab Results  Component Value Date   HDL 40.90 11/25/2019   Lab Results  Component Value Date   LDLCALC 100 (H) 12/02/2018   Lab Results    Component Value Date   TRIG 215.0 (H) 11/25/2019   Lab Results  Component Value Date   CHOLHDL 4 11/25/2019   Lab Results  Component Value Date   HGBA1C 4.4 (L) 11/25/2019       Assessment & Plan:   Problem List Items Addressed This Visit    Hypertension    Well controlled, no changes to meds. Encouraged heart healthy diet such as the DASH diet and exercise as tolerated.       Obesity    Encouraged DASH diet, decrease po intake and increase exercise as tolerated. Needs 7-8 hours of sleep nightly. Avoid trans fats, eat small, frequent meals every 4-5 hours with lean proteins, complex carbs and healthy fats. Minimize simple carbs      Preventative health care    Patient encouraged to maintain heart healthy diet, regular exercise, adequate sleep. Consider daily probiotics. Take medications as prescribed. Labs reviewed. Immunizations UTD has recently had covid so will wait for shots til 90 days after infection      Hyperlipidemia    Tolerating statin, encouraged heart healthy diet, avoid trans fats, minimize simple carbs and saturated fats. Increase exercise as tolerated      Hyperglycemia    hgba1c acceptable, minimize simple carbs. Increase exercise as tolerated.       Thrombocytosis    Mild, asymptomatic. Continue to monitor         I have discontinued Cala BradfordKimberly K. Dever's cephALEXin. I am also having her maintain her aspirin EC, levocetirizine, fluticasone, ALPRAZolam, valsartan-hydrochlorothiazide, budesonide, rivaroxaban, fenofibrate, diltiazem, metoprolol succinate, montelukast, metoprolol succinate, benzonatate, and albuterol.  No orders of the defined types were placed in this encounter.    Danise EdgeStacey Danel Requena, MD

## 2019-12-09 NOTE — Assessment & Plan Note (Signed)
hgba1c acceptable, minimize simple carbs. Increase exercise as tolerated.  

## 2019-12-09 NOTE — Assessment & Plan Note (Signed)
Tolerating statin, encouraged heart healthy diet, avoid trans fats, minimize simple carbs and saturated fats. Increase exercise as tolerated 

## 2019-12-20 ENCOUNTER — Other Ambulatory Visit: Payer: Self-pay | Admitting: Family Medicine

## 2020-02-09 ENCOUNTER — Other Ambulatory Visit: Payer: Self-pay | Admitting: Family Medicine

## 2020-02-09 DIAGNOSIS — Z1231 Encounter for screening mammogram for malignant neoplasm of breast: Secondary | ICD-10-CM

## 2020-02-12 NOTE — Telephone Encounter (Signed)
Error

## 2020-03-03 ENCOUNTER — Encounter: Payer: Self-pay | Admitting: Family Medicine

## 2020-03-04 NOTE — Telephone Encounter (Signed)
Spoke with patient and she will just try and do an E-visit or do a visit her insurance.

## 2020-03-24 ENCOUNTER — Other Ambulatory Visit: Payer: Self-pay | Admitting: Family Medicine

## 2020-03-24 ENCOUNTER — Ambulatory Visit: Payer: 59

## 2020-03-30 ENCOUNTER — Inpatient Hospital Stay: Admission: RE | Admit: 2020-03-30 | Payer: 59 | Source: Ambulatory Visit

## 2020-05-11 ENCOUNTER — Other Ambulatory Visit: Payer: Self-pay | Admitting: Family Medicine

## 2020-06-10 ENCOUNTER — Ambulatory Visit: Payer: 59 | Admitting: Family Medicine

## 2020-06-17 ENCOUNTER — Other Ambulatory Visit: Payer: Self-pay | Admitting: Family Medicine

## 2020-07-28 ENCOUNTER — Encounter: Payer: Self-pay | Admitting: Family Medicine

## 2020-07-28 ENCOUNTER — Telehealth (INDEPENDENT_AMBULATORY_CARE_PROVIDER_SITE_OTHER): Payer: 59 | Admitting: Family Medicine

## 2020-07-28 ENCOUNTER — Other Ambulatory Visit: Payer: Self-pay

## 2020-07-28 DIAGNOSIS — J0100 Acute maxillary sinusitis, unspecified: Secondary | ICD-10-CM | POA: Diagnosis not present

## 2020-07-28 MED ORDER — DOXYCYCLINE HYCLATE 100 MG PO TABS: 100.0000 mg | ORAL_TABLET | Freq: Two times a day (BID) | ORAL | 0 refills | Status: AC

## 2020-07-28 MED ORDER — PREDNISONE 20 MG PO TABS: 40.0000 mg | ORAL_TABLET | Freq: Every day | ORAL | 0 refills | Status: AC

## 2020-07-28 NOTE — Progress Notes (Signed)
Chief Complaint  Patient presents with   Sinusitis   Cough    Kathleen Church here for URI complaints. Due to COVID-19 pandemic, we are interacting via web portal for an electronic face-to-face visit. I verified patient's ID using 2 identifiers. Patient agreed to proceed with visit via this method. Patient is at home, I am at office. Patient and I are present for visit.   Duration: 2 weeks  Associated symptoms: sinus congestion, sinus pain, rhinorrhea, wheezing, dental pain, and coughing Denies: itchy watery eyes, ear pain, ear drainage, sore throat, shortness of breath, myalgia, and fevers Treatment to date: Delsym, Tylenol cold and sinus Sick contacts: Yes; grandchild and daughter  Past Medical History:  Diagnosis Date   Allergic state    Anxiety    when father passed   Arthritis    Asthma, mild persistent    allergy induced asthma    Cervical cancer screening 04/27/2011   Edema 04/27/2011   Heart murmur    as an infant    Hyperlipidemia 04/27/2011   Hypertension 2010   Hypokalemia 04/27/2011   Measles as a child   Mumps as a child   Muscle cramp 10/14/2015   Obesity    Osteoarthritis of left knee 10/14/2015   Plantar fasciitis, left 01/19/2013   Preventative health care 02/13/2011   Sinusitis 02/13/2011    Objective No conversational dyspnea Age appropriate judgment and insight Nml affect and mood  Acute maxillary sinusitis, recurrence not specified - Plan: doxycycline (VIBRA-TABS) 100 MG tablet, predniSONE (DELTASONE) 20 MG tablet  5 d pred burst given wheezing and sinus pain. 40 mg/d. If no better in 2 d, will take 7 d of doxy 100 mg bid.  Continue to push fluids, practice good hand hygiene, cover mouth when coughing. F/u prn. If starting to experience fevers, shaking, or shortness of breath, seek immediate care. Pt voiced understanding and agreement to the plan.  Jilda Roche [name], DO 07/28/20 2:18 PM

## 2020-08-11 ENCOUNTER — Other Ambulatory Visit: Payer: Self-pay | Admitting: Family Medicine

## 2020-09-10 ENCOUNTER — Other Ambulatory Visit: Payer: Self-pay | Admitting: Family Medicine

## 2020-10-09 ENCOUNTER — Other Ambulatory Visit: Payer: Self-pay | Admitting: Family Medicine

## 2020-11-03 ENCOUNTER — Other Ambulatory Visit: Payer: Self-pay | Admitting: Family Medicine

## 2020-11-06 ENCOUNTER — Ambulatory Visit: Payer: 59

## 2020-12-21 ENCOUNTER — Other Ambulatory Visit: Payer: Self-pay | Admitting: Family Medicine

## 2021-01-05 ENCOUNTER — Other Ambulatory Visit: Payer: Self-pay | Admitting: Family Medicine

## 2021-01-18 ENCOUNTER — Encounter: Payer: Self-pay | Admitting: Family Medicine

## 2021-01-18 ENCOUNTER — Ambulatory Visit (INDEPENDENT_AMBULATORY_CARE_PROVIDER_SITE_OTHER): Payer: 59 | Admitting: Family Medicine

## 2021-01-18 ENCOUNTER — Other Ambulatory Visit (HOSPITAL_COMMUNITY)
Admission: RE | Admit: 2021-01-18 | Discharge: 2021-01-18 | Disposition: A | Discharge status: Home / Self Care | Payer: 59 | Source: Ambulatory Visit | Attending: Family Medicine | Admitting: Family Medicine

## 2021-01-18 VITALS — BP 138/84 | HR 94 | Temp 98.0°F | Resp 16 | Ht 68.0 in | Wt 248.2 lb

## 2021-01-18 DIAGNOSIS — L989 Disorder of the skin and subcutaneous tissue, unspecified: Secondary | ICD-10-CM | POA: Insufficient documentation

## 2021-01-18 DIAGNOSIS — R252 Cramp and spasm: Secondary | ICD-10-CM

## 2021-01-18 DIAGNOSIS — E785 Hyperlipidemia, unspecified: Secondary | ICD-10-CM

## 2021-01-18 DIAGNOSIS — Z780 Asymptomatic menopausal state: Secondary | ICD-10-CM

## 2021-01-18 DIAGNOSIS — Z23 Encounter for immunization: Secondary | ICD-10-CM | POA: Diagnosis not present

## 2021-01-18 DIAGNOSIS — Z1231 Encounter for screening mammogram for malignant neoplasm of breast: Secondary | ICD-10-CM | POA: Diagnosis not present

## 2021-01-18 DIAGNOSIS — E2839 Other primary ovarian failure: Secondary | ICD-10-CM

## 2021-01-18 DIAGNOSIS — R739 Hyperglycemia, unspecified: Secondary | ICD-10-CM

## 2021-01-18 DIAGNOSIS — I10 Essential (primary) hypertension: Secondary | ICD-10-CM | POA: Diagnosis not present

## 2021-01-18 DIAGNOSIS — Z124 Encounter for screening for malignant neoplasm of cervix: Secondary | ICD-10-CM

## 2021-01-18 DIAGNOSIS — M1711 Unilateral primary osteoarthritis, right knee: Secondary | ICD-10-CM

## 2021-01-18 DIAGNOSIS — Z0000 Encounter for general adult medical examination without abnormal findings: Secondary | ICD-10-CM

## 2021-01-18 DIAGNOSIS — Z96659 Presence of unspecified artificial knee joint: Secondary | ICD-10-CM

## 2021-01-18 DIAGNOSIS — E6609 Other obesity due to excess calories: Secondary | ICD-10-CM

## 2021-01-18 LAB — LIPID PANEL
Cholesterol: 171 mg/dL (ref 0–200)
HDL: 42.6 mg/dL (ref >39.00)
LDL Cholesterol: 95 mg/dL (ref 0–99)
NonHDL: 128.86
Total CHOL/HDL Ratio: 4
Triglycerides: 168 mg/dL — ABNORMAL HIGH (ref 0.0–149.0)
VLDL: 33.6 mg/dL (ref 0.0–40.0)

## 2021-01-18 LAB — CBC WITH DIFFERENTIAL/PLATELET
Basophils Absolute: 0 10*3/uL (ref 0.0–0.1)
Basophils Relative: 0.5 % (ref 0.0–3.0)
Eosinophils Absolute: 0.2 10*3/uL (ref 0.0–0.7)
Eosinophils Relative: 2.7 % (ref 0.0–5.0)
HCT: 41.9 % (ref 36.0–46.0)
Hemoglobin: 13.7 g/dL (ref 12.0–15.0)
Lymphocytes Relative: 21.3 % (ref 12.0–46.0)
Lymphs Abs: 1.9 10*3/uL (ref 0.7–4.0)
MCHC: 32.6 g/dL (ref 30.0–36.0)
MCV: 91 fl (ref 78.0–100.0)
Monocytes Absolute: 0.6 10*3/uL (ref 0.1–1.0)
Monocytes Relative: 6.8 % (ref 3.0–12.0)
Neutro Abs: 6.1 10*3/uL (ref 1.4–7.7)
Neutrophils Relative %: 68.7 % (ref 43.0–77.0)
Platelets: 440 10*3/uL — ABNORMAL HIGH (ref 150.0–400.0)
RBC: 4.61 Mil/uL (ref 3.87–5.11)
RDW: 13.8 % (ref 11.5–15.5)
WBC: 8.9 10*3/uL (ref 4.0–10.5)

## 2021-01-18 LAB — COMPREHENSIVE METABOLIC PANEL
ALT: 30 U/L (ref 0–35)
AST: 24 U/L (ref 0–37)
Albumin: 4.3 g/dL (ref 3.5–5.2)
Alkaline Phosphatase: 65 U/L (ref 39–117)
BUN: 13 mg/dL (ref 6–23)
CO2: 29 mEq/L (ref 19–32)
Calcium: 9.6 mg/dL (ref 8.4–10.5)
Chloride: 102 mEq/L (ref 96–112)
Creatinine, Ser: 0.61 mg/dL (ref 0.40–1.20)
GFR: 96.27 mL/min (ref >60.00)
Glucose, Bld: 81 mg/dL (ref 70–99)
Potassium: 3.8 mEq/L (ref 3.5–5.1)
Sodium: 140 mEq/L (ref 135–145)
Total Bilirubin: 0.5 mg/dL (ref 0.2–1.2)
Total Protein: 7.1 g/dL (ref 6.0–8.3)

## 2021-01-18 LAB — TSH: TSH: 1.29 u[IU]/mL (ref 0.35–5.50)

## 2021-01-18 LAB — HEMOGLOBIN A1C: Hgb A1c MFr Bld: 5.1 % (ref 4.6–6.5)

## 2021-01-18 NOTE — Assessment & Plan Note (Deleted)
S/p TKR and doing well but needs to ice after activity

## 2021-01-18 NOTE — Patient Instructions (Signed)
Wegovy or Saxenda consider for weight loss    Preventive Care 11-62 Years Old, Female Preventive care refers to lifestyle choices and visits with your health care provider that can promote health and wellness. Preventive care visits are also called wellness exams. What can I expect for my preventive care visit? Counseling Your health care provider may ask you questions about your: Medical history, including: Past medical problems. Family medical history. Pregnancy history. Current health, including: Menstrual cycle. Method of birth control. Emotional well-being. Home life and relationship well-being. Sexual activity and sexual health. Lifestyle, including: Alcohol, nicotine or tobacco, and drug use. Access to firearms. Diet, exercise, and sleep habits. Work and work Statistician. Sunscreen use. Safety issues such as seatbelt and bike helmet use. Physical exam Your health care provider will check your: Height and weight. These may be used to calculate your BMI (body mass index). BMI is a measurement that tells if you are at a healthy weight. Waist circumference. This measures the distance around your waistline. This measurement also tells if you are at a healthy weight and may help predict your risk of certain diseases, such as type 2 diabetes and high blood pressure. Heart rate and blood pressure. Body temperature. Skin for abnormal spots. What immunizations do I need? Vaccines are usually given at various ages, according to a schedule. Your health care provider will recommend vaccines for you based on your age, medical history, and lifestyle or other factors, such as travel or where you work. What tests do I need? Screening Your health care provider may recommend screening tests for certain conditions. This may include: Lipid and cholesterol levels. Diabetes screening. This is done by checking your blood sugar (glucose) after you have not eaten for a while (fasting). Pelvic  exam and Pap test. Hepatitis B test. Hepatitis C test. HIV (human immunodeficiency virus) test. STI (sexually transmitted infection) testing, if you are at risk. Lung cancer screening. Colorectal cancer screening. Mammogram. Talk with your health care provider about when you should start having regular mammograms. This may depend on whether you have a family history of breast cancer. BRCA-related cancer screening. This may be done if you have a family history of breast, ovarian, tubal, or peritoneal cancers. Bone density scan. This is done to screen for osteoporosis. Talk with your health care provider about your test results, treatment options, and if necessary, the need for more tests. Follow these instructions at home: Eating and drinking  Eat a diet that includes fresh fruits and vegetables, whole grains, lean protein, and low-fat dairy products. Take vitamin and mineral supplements as recommended by your health care provider. Do not drink alcohol if: Your health care provider tells you not to drink. You are pregnant, may be pregnant, or are planning to become pregnant. If you drink alcohol: Limit how much you have to 0-1 drink a day. Know how much alcohol is in your drink. In the [place] one drink equals one 12 oz bottle of beer (355 mL), one 5 oz glass of wine (148 mL), or one 1 oz glass of hard liquor (44 mL). Lifestyle Brush your teeth every morning and night with fluoride toothpaste. Floss one time each day. Exercise for at least 30 minutes 5 or more days each week. Do not use any products that contain nicotine or tobacco. These products include cigarettes, chewing tobacco, and vaping devices, such as e-cigarettes. If you need help quitting, ask your health care provider. Do not use drugs. If you are sexually active, practice safe sex.  Use a condom or other form of protection to prevent STIs. If you do not wish to become pregnant, use a form of birth control. If you plan to become  pregnant, see your health care provider for a prepregnancy visit. Take aspirin only as told by your health care provider. Make sure that you understand how much to take and what form to take. Work with your health care provider to find out whether it is safe and beneficial for you to take aspirin daily. Find healthy ways to manage stress, such as: Meditation, yoga, or listening to music. Journaling. Talking to a trusted person. Spending time with friends and family. Minimize exposure to UV radiation to reduce your risk of skin cancer. Safety Always wear your seat belt while driving or riding in a vehicle. Do not drive: If you have been drinking alcohol. Do not ride with someone who has been drinking. When you are tired or distracted. While texting. If you have been using any mind-altering substances or drugs. Wear a helmet and other protective equipment during sports activities. If you have firearms in your house, make sure you follow all gun safety procedures. Seek help if you have been physically or sexually abused. What's next? Visit your health care provider once a year for an annual wellness visit. Ask your health care provider how often you should have your eyes and teeth checked. Stay up to date on all vaccines. This information is not intended to replace advice given to you by your health care provider. Make sure you discuss any questions you have with your health care provider. Document Revised: 06/23/2020 Document Reviewed: 06/23/2020 Elsevier Patient Education  [place].

## 2021-01-18 NOTE — Assessment & Plan Note (Signed)
Follows with Dr Delman Cheadle of derm encouraged to call for an appt and to keep annual appt after that.

## 2021-01-18 NOTE — Assessment & Plan Note (Signed)
Patient encouraged to maintain heart healthy diet, regular exercise, adequate sleep. Consider daily probiotics. Take medications as prescribed. Labs ordered and reviewed. MGM and Dexa scan ordered. Pap completed. cologuard ordered. Declines Covid shots. Tdap due later this month, sooner if injury occurs.

## 2021-01-18 NOTE — Progress Notes (Signed)
Subjective:    Patient ID: Kathleen Church, female    DOB: 06/14/1960, 61 y.o.   MRN: [id number]  Chief Complaint  Patient presents with   Annual Exam    Spot on leg    HPI Patient is in today for annual preventative exam and follow-up on chronic medical concerns.  Overall she is doing well.  No recent febrile illness or hospitalizations.  She continues to work too many hours and she notes her biggest concern is finding enough time to get 6 to 8 hours of sleep.  She still manages her ADLs and the keeping up of her home and yard herself as long as she takes frequent breaks she is doing okay.  She has trouble daily with her knees.  She had a left total knee replacement and has been told she needs a right total knee replacement this year but is not sure when she will proceed.  She notes with icing after working out in the yard she is able to manage her discomfort.  She tries to maintain a heart healthy diet but does better on some days than others. Denies CP/palp/SOB/HA/congestion/fevers/GI or GU c/o. Taking meds as prescribed   Past Medical History:  Diagnosis Date   Allergic state    Anxiety    when father passed   Arthritis    Asthma, mild persistent    allergy induced asthma    Cervical cancer screening 04/27/2011   Edema 04/27/2011   Heart murmur    as an infant    Hyperlipidemia 04/27/2011   Hypertension 2010   Hypokalemia 04/27/2011   Measles as a child   Mumps as a child   Muscle cramp 10/14/2015   Obesity    Osteoarthritis of left knee 10/14/2015   Plantar fasciitis, left 01/19/2013   Preventative health care 02/13/2011   Sinusitis 02/13/2011    Past Surgical History:  Procedure Laterality Date   ENDOMETRIAL ABLATION  2006   menorraghia   KNEE SURGERY  2006   left knee   TONSILLECTOMY     TOTAL KNEE ARTHROPLASTY Left 10/15/2015   Procedure: LEFT TOTAL KNEE ARTHROPLASTY;  Surgeon: Sydnee Cabal, MD;  Location: [facility];  Service: Orthopedics;  Laterality: Left;   TUBAL  LIGATION      Family History  Problem Relation Age of Onset   Cancer Father        kidney   Cancer Maternal Grandmother        bone   Factor V Leiden deficiency Maternal Grandmother    Cancer Paternal Grandmother        breast   Breast cancer Paternal Grandmother    Asthma Daughter    Heart attack Paternal Grandfather    Cancer Mother 64       metastatic lung cancer   Stroke Mother    Asthma Daughter    Factor V Leiden deficiency Maternal Aunt     Social History   Socioeconomic History   Marital status: Widowed    Spouse name: Not on file   Number of children: Not on file   Years of education: Not on file   Highest education level: Not on file  Occupational History   Not on file  Tobacco Use   Smoking status: Never   Smokeless tobacco: Never  Substance and Sexual Activity   Alcohol use: Yes    Comment: occas    Drug use: No   Sexual activity: Yes    Partners: Male  Other Topics  Concern   Not on file  Social History Narrative   Not on file   Social Determinants of Health   Financial Resource Strain: Not on file  Food Insecurity: Not on file  Transportation Needs: Not on file  Physical Activity: Not on file  Stress: Not on file  Social Connections: Not on file  Intimate Partner Violence: Not on file    Outpatient Medications Prior to Visit  Medication Sig Dispense Refill   albuterol (VENTOLIN HFA) 108 (90 Base) MCG/ACT inhaler TAKE 2 PUFFS BY MOUTH EVERY 6 HOURS AS NEEDED FOR WHEEZE OR SHORTNESS OF BREATH 18 each 3   ALPRAZolam (XANAX) 0.25 MG tablet Take 1 tablet (0.25 mg total) by mouth 2 (two) times daily as needed for anxiety. 5 tablet 0   aspirin EC 325 MG tablet Take 1 tablet (325 mg total) by mouth 2 (two) times daily. 60 tablet 0   benzonatate (TESSALON) 100 MG capsule Take 1 capsule (100 mg total) by mouth 3 (three) times daily as needed. 30 capsule 0   budesonide (PULMICORT FLEXHALER) 180 MCG/ACT inhaler TAKE 2 PUFFS BY MOUTH TWICE A DAY 1 each 5    diltiazem (TIAZAC) 120 MG 24 hr capsule Take by mouth.     fenofibrate (TRICOR) 145 MG tablet TAKE 1 TABLET BY MOUTH EVERY DAY 90 tablet 1   fluticasone (FLONASE) 50 MCG/ACT nasal spray Place 2 sprays into both nostrils daily. 16 g 1   levocetirizine (XYZAL) 5 MG tablet Take 1 tablet (5 mg total) by mouth every evening. 30 tablet 5   metoprolol succinate (TOPROL-XL) 100 MG 24 hr tablet Take by mouth.     metoprolol succinate (TOPROL-XL) 50 MG 24 hr tablet TAKE 1 TABLET (50 MG TOTAL) BY MOUTH DAILY. TAKE WITH OR IMMEDIATELY FOLLOWING A MEAL. 90 tablet 1   montelukast (SINGULAIR) 10 MG tablet TAKE 1 TABLET BY MOUTH EVERYDAY AT BEDTIME 90 tablet 1   rivaroxaban (XARELTO) 20 MG TABS tablet Take by mouth.     valsartan-hydrochlorothiazide (DIOVAN-HCT) 160-12.5 MG tablet TAKE 1 TABLET BY MOUTH EVERY DAY 90 tablet 1   No facility-administered medications prior to visit.    Allergies  Allergen Reactions   Codeine Palpitations and Rash    Review of Systems  Constitutional:  Positive for malaise/fatigue. Negative for chills and fever.  HENT:  Negative for congestion and hearing loss.   Eyes:  Negative for discharge.  Respiratory:  Negative for cough, sputum production and shortness of breath.   Cardiovascular:  Negative for chest pain, palpitations and leg swelling.  Gastrointestinal:  Negative for abdominal pain, blood in stool, constipation, diarrhea, heartburn, nausea and vomiting.  Genitourinary:  Negative for dysuria, frequency, hematuria and urgency.  Musculoskeletal:  Positive for joint pain. Negative for back pain, falls and myalgias.  Skin:  Negative for rash.  Neurological:  Negative for dizziness, sensory change, loss of consciousness, weakness and headaches.  Endo/Heme/Allergies:  Negative for environmental allergies. Does not bruise/bleed easily.  Psychiatric/Behavioral:  Negative for depression and suicidal ideas. The patient is not nervous/anxious and does not have insomnia.        Objective:    Physical Exam Constitutional:      General: She is not in acute distress.    Appearance: She is well-developed.  HENT:     Head: Normocephalic and atraumatic.  Eyes:     Conjunctiva/sclera: Conjunctivae normal.  Neck:     Thyroid: No thyromegaly.  Cardiovascular:     Rate and Rhythm: Normal rate  and regular rhythm.     Heart sounds: Normal heart sounds. No murmur heard. Pulmonary:     Effort: Pulmonary effort is normal. No respiratory distress.     Breath sounds: Normal breath sounds.  Abdominal:     General: Bowel sounds are normal. There is no distension.     Palpations: Abdomen is soft. There is no mass.     Tenderness: There is no abdominal tenderness.  Genitourinary:    General: Normal vulva.     Vagina: No vaginal discharge.     Rectum: Normal.  Musculoskeletal:        General: Normal range of motion.     Cervical back: Neck supple.     Right lower leg: Edema present.     Left lower leg: Edema present.  Lymphadenopathy:     Cervical: No cervical adenopathy.  Skin:    General: Skin is warm and dry.  Neurological:     Mental Status: She is alert and oriented to person, place, and time.  Psychiatric:        Behavior: Behavior normal.    BP 138/84    Pulse 94    Temp 98 F (36.7 C)    Resp 16    Ht 5\' 8"  (1.727 m)    Wt 248 lb 3.2 oz (112.6 kg)    SpO2 99%    BMI 37.74 kg/m  Wt Readings from Last 3 Encounters:  01/18/21 248 lb 3.2 oz (112.6 kg)  12/09/19 227 lb 12.8 oz (103.3 kg)  11/25/19 229 lb 6.4 oz (104.1 kg)    Diabetic Foot Exam - Simple   No data filed    Lab Results  Component Value Date   WBC 10.0 11/25/2019   HGB 13.0 11/25/2019   HCT 38.9 11/25/2019   PLT 478.0 (H) 11/25/2019   GLUCOSE 87 11/25/2019   CHOL 179 11/25/2019   TRIG 215.0 (H) 11/25/2019   HDL 40.90 11/25/2019   LDLDIRECT 101.0 11/25/2019   LDLCALC 100 (H) 12/02/2018   ALT 12 11/25/2019   AST 16 11/25/2019   NA 139 11/25/2019   K 3.9 11/25/2019   CL  101 11/25/2019   CREATININE 0.64 11/25/2019   BUN 16 11/25/2019   CO2 30 11/25/2019   TSH 1.24 11/25/2019   INR 1.03 10/08/2015   HGBA1C 4.4 (L) 11/25/2019    Lab Results  Component Value Date   TSH 1.24 11/25/2019   Lab Results  Component Value Date   WBC 10.0 11/25/2019   HGB 13.0 11/25/2019   HCT 38.9 11/25/2019   MCV 94.1 11/25/2019   PLT 478.0 (H) 11/25/2019   Lab Results  Component Value Date   NA 139 11/25/2019   K 3.9 11/25/2019   CO2 30 11/25/2019   GLUCOSE 87 11/25/2019   BUN 16 11/25/2019   CREATININE 0.64 11/25/2019   BILITOT 0.5 11/25/2019   ALKPHOS 41 11/25/2019   AST 16 11/25/2019   ALT 12 11/25/2019   PROT 7.2 11/25/2019   ALBUMIN 4.2 11/25/2019   CALCIUM 9.9 11/25/2019   ANIONGAP 7 10/16/2015   GFR 95.93 11/25/2019   Lab Results  Component Value Date   CHOL 179 11/25/2019   Lab Results  Component Value Date   HDL 40.90 11/25/2019   Lab Results  Component Value Date   LDLCALC 100 (H) 12/02/2018   Lab Results  Component Value Date   TRIG 215.0 (H) 11/25/2019   Lab Results  Component Value Date   CHOLHDL 4 11/25/2019  Lab Results  Component Value Date   HGBA1C 4.4 (L) 11/25/2019       Assessment & Plan:   Problem List Items Addressed This Visit     Hypertension    Well controlled, no changes to meds. Encouraged heart healthy diet such as the DASH diet and exercise as tolerated.       Relevant Orders   CBC with Differential/Platelet   Comprehensive metabolic panel   TSH   Lipid panel   Obesity    Encouraged DASH or MIND diet, decrease po intake and increase exercise as tolerated. Needs 7-8 hours of sleep nightly. Avoid trans fats, eat small, frequent meals every 4-5 hours with lean proteins, complex carbs and healthy fats. Minimize simple carbs, high fat foods and processed foods. Consider [phone]      Preventative health care    Patient encouraged to maintain heart healthy diet, regular exercise, adequate sleep. Consider  daily probiotics. Take medications as prescribed. Labs ordered and reviewed. MGM and Dexa scan ordered. Pap completed. cologuard ordered. Declines Covid shots. Tdap due later this month, sooner if injury occurs.      Cervical cancer screening   Relevant Orders   Cytology - PAP( [place])   Hyperlipidemia    Encourage heart healthy diet such as MIND or DASH diet, increase exercise, avoid trans fats, simple carbohydrates and processed foods, consider a krill or fish or flaxseed oil cap daily.       Relevant Orders   CBC with Differential/Platelet   Comprehensive metabolic panel   TSH   Lipid panel   Arthritis of right knee    Considering TKR this year with Dr Theda Sers, has to ice after activity but is staying active      Muscle cramp    Hydrate and monitor      S/P knee replacement    S/p TKR and doing well but needs to ice after activity      Hyperglycemia - Primary    hgba1c acceptable, minimize simple carbs. Increase exercise as tolerated. Continue current meds      Relevant Orders   Hemoglobin A1c   Other Visit Diagnoses     Post-menopause       Relevant Orders   DG Bone Density   Estrogen deficiency       Relevant Orders   DG Bone Density   Encounter for screening mammogram for malignant neoplasm of breast       Relevant Orders   MM 3D SCREEN BREAST BILATERAL       I am having Dimitri Ped maintain her aspirin EC, levocetirizine, fluticasone, ALPRAZolam, rivaroxaban, diltiazem, metoprolol succinate, benzonatate, albuterol, metoprolol succinate, fenofibrate, montelukast, valsartan-hydrochlorothiazide, and Pulmicort Flexhaler.  No orders of the defined types were placed in this encounter.    Penni Homans, MD

## 2021-01-18 NOTE — Assessment & Plan Note (Signed)
Well controlled, no changes to meds. Encouraged heart healthy diet such as the DASH diet and exercise as tolerated.  °

## 2021-01-18 NOTE — Assessment & Plan Note (Signed)
S/p TKR and doing well but needs to ice after activity °

## 2021-01-18 NOTE — Assessment & Plan Note (Signed)
Hydrate and monitor 

## 2021-01-18 NOTE — Assessment & Plan Note (Signed)
hgba1c acceptable, minimize simple carbs. Increase exercise as tolerated. Continue current meds 

## 2021-01-18 NOTE — Assessment & Plan Note (Signed)
Encourage heart healthy diet such as MIND or DASH diet, increase exercise, avoid trans fats, simple carbohydrates and processed foods, consider a krill or fish or flaxseed oil cap daily.  °

## 2021-01-18 NOTE — Assessment & Plan Note (Signed)
Considering TKR this year with Dr Thomasena Edis, has to ice after activity but is staying active

## 2021-01-18 NOTE — Assessment & Plan Note (Signed)
Encouraged DASH or MIND diet, decrease po intake and increase exercise as tolerated. Needs 7-8 hours of sleep nightly. Avoid trans fats, eat small, frequent meals every 4-5 hours with lean proteins, complex carbs and healthy fats. Minimize simple carbs, high fat foods and processed foods. Consider Wegovy 

## 2021-01-21 LAB — CYTOLOGY - PAP
Comment: NEGATIVE
Diagnosis: NEGATIVE
Diagnosis: REACTIVE
High risk HPV: NEGATIVE

## 2021-01-27 ENCOUNTER — Ambulatory Visit (HOSPITAL_BASED_OUTPATIENT_CLINIC_OR_DEPARTMENT_OTHER): Payer: 59

## 2021-01-27 ENCOUNTER — Other Ambulatory Visit (HOSPITAL_BASED_OUTPATIENT_CLINIC_OR_DEPARTMENT_OTHER): Payer: 59

## 2021-02-02 ENCOUNTER — Ambulatory Visit (HOSPITAL_BASED_OUTPATIENT_CLINIC_OR_DEPARTMENT_OTHER): Payer: 59

## 2021-02-03 ENCOUNTER — Other Ambulatory Visit (HOSPITAL_BASED_OUTPATIENT_CLINIC_OR_DEPARTMENT_OTHER): Payer: 59

## 2021-02-15 ENCOUNTER — Encounter (HOSPITAL_BASED_OUTPATIENT_CLINIC_OR_DEPARTMENT_OTHER): Payer: Self-pay

## 2021-02-15 ENCOUNTER — Ambulatory Visit (HOSPITAL_BASED_OUTPATIENT_CLINIC_OR_DEPARTMENT_OTHER)
Admission: RE | Admit: 2021-02-15 | Discharge: 2021-02-15 | Disposition: A | Discharge status: Home / Self Care | Payer: 59 | Source: Ambulatory Visit | Attending: Family Medicine | Admitting: Family Medicine

## 2021-02-15 ENCOUNTER — Other Ambulatory Visit: Payer: Self-pay

## 2021-02-15 DIAGNOSIS — E2839 Other primary ovarian failure: Secondary | ICD-10-CM | POA: Insufficient documentation

## 2021-02-15 DIAGNOSIS — Z780 Asymptomatic menopausal state: Secondary | ICD-10-CM | POA: Insufficient documentation

## 2021-02-15 DIAGNOSIS — Z1231 Encounter for screening mammogram for malignant neoplasm of breast: Secondary | ICD-10-CM | POA: Insufficient documentation

## 2021-02-17 ENCOUNTER — Other Ambulatory Visit: Payer: Self-pay | Admitting: Family Medicine

## 2021-04-07 ENCOUNTER — Ambulatory Visit: Payer: 59 | Admitting: Family Medicine

## 2021-04-07 ENCOUNTER — Encounter: Payer: Self-pay | Admitting: Family Medicine

## 2021-04-07 VITALS — BP 144/86 | HR 96 | Temp 98.8°F | Resp 20 | Ht 68.0 in | Wt 250.0 lb

## 2021-04-07 DIAGNOSIS — J0140 Acute pansinusitis, unspecified: Secondary | ICD-10-CM

## 2021-04-07 DIAGNOSIS — R051 Acute cough: Secondary | ICD-10-CM | POA: Diagnosis not present

## 2021-04-07 MED ORDER — PREDNISONE 20 MG PO TABS: 20.0000 mg | ORAL_TABLET | Freq: Every day | ORAL | 0 refills | Status: AC

## 2021-04-07 MED ORDER — AMOXICILLIN-POT CLAVULANATE 875-125 MG PO TABS: 1.0000 | ORAL_TABLET | Freq: Two times a day (BID) | ORAL | 0 refills | Status: AC

## 2021-04-07 MED ORDER — HYDROCODONE BIT-HOMATROP MBR 5-1.5 MG/5ML PO SOLN: 5.0000 mL | Freq: Three times a day (TID) | ORAL | 0 refills | Status: AC | PRN

## 2021-04-07 MED ORDER — ALBUTEROL SULFATE HFA 108 (90 BASE) MCG/ACT IN AERS: INHALATION_SPRAY | RESPIRATORY_TRACT | 3 refills | Status: AC

## 2021-04-07 NOTE — Progress Notes (Signed)
? ?Acute Office Visit ? ?Subjective:  ? ? Patient ID: Kathleen Church, female    DOB: 1960-09-04, 61 y.o.   MRN: [id number] ? ?Chief Complaint  ?Patient presents with  ? Cough  ?  Since last week, productive cough, green ?Covid negative yesterday   ? sinus congestion  ? Headache  ? ? ?Cough ?The current episode started 1 to 4 weeks ago. The cough is Productive of purulent sputum. Associated symptoms include headaches, nasal congestion, postnasal drip, rhinorrhea, a sore throat, shortness of breath and wheezing. Pertinent negatives include no chest pain, chills, ear congestion, ear pain, fever, heartburn, hemoptysis, myalgias, rash, sweats or weight loss. Her past medical history is significant for asthma.  ?Sinusitis ?This is a new problem. The current episode started 1 to 4 weeks ago. The problem has been gradually worsening since onset. There has been no fever. Her pain is at a severity of 9/10. The pain is severe. Associated symptoms include congestion, coughing, headaches, shortness of breath, sneezing and a sore throat. Pertinent negatives include no chills, ear pain or hoarse voice. Treatments tried: Mucinex, delsym, Tylenol Sinus. The treatment provided mild relief.  ? ? ?Started >1 week ago like allergies/cold (recently kept grandkids while they were sick), then coughing last Thursday and has settled into her sinuses.  ? ? ? ? ? ? ?Past Medical History:  ?Diagnosis Date  ? Allergic state   ? Anxiety   ? when father passed  ? Arthritis   ? Asthma, mild persistent   ? allergy induced asthma   ? Cervical cancer screening 04/27/2011  ? Edema 04/27/2011  ? Heart murmur   ? as an infant   ? Hyperlipidemia 04/27/2011  ? Hypertension 2010  ? Hypokalemia 04/27/2011  ? Measles as a child  ? Mumps as a child  ? Muscle cramp 10/14/2015  ? Obesity   ? Osteoarthritis of left knee 10/14/2015  ? Plantar fasciitis, left 01/19/2013  ? Preventative health care 02/13/2011  ? Sinusitis 02/13/2011  ? ? ?Past Surgical History:  ?Procedure  Laterality Date  ? ENDOMETRIAL ABLATION  2006  ? menorraghia  ? KNEE SURGERY  2006  ? left knee  ? TONSILLECTOMY    ? TOTAL KNEE ARTHROPLASTY Left 10/15/2015  ? Procedure: LEFT TOTAL KNEE ARTHROPLASTY;  Surgeon: Eugenia Mcalpine, MD;  Location: [facility];  Service: Orthopedics;  Laterality: Left;  ? TUBAL LIGATION    ? ? ?Family History  ?Problem Relation Age of Onset  ? Cancer Father   ?     kidney  ? Cancer Maternal Grandmother   ?     bone  ? Factor V Leiden deficiency Maternal Grandmother   ? Cancer Paternal Grandmother   ?     breast  ? Breast cancer Paternal Grandmother   ? Asthma Daughter   ? Heart attack Paternal Grandfather   ? Cancer Mother 60  ?     metastatic lung cancer  ? Stroke Mother   ? Asthma Daughter   ? Factor V Leiden deficiency Maternal Aunt   ? ? ?Social History  ? ?Socioeconomic History  ? Marital status: Widowed  ?  Spouse name: Not on file  ? Number of children: Not on file  ? Years of education: Not on file  ? Highest education level: Not on file  ?Occupational History  ? Not on file  ?Tobacco Use  ? Smoking status: Never  ? Smokeless tobacco: Never  ?Substance and Sexual Activity  ? Alcohol use: Yes  ?  Comment: occas   ? Drug use: No  ? Sexual activity: Yes  ?  Partners: Male  ?Other Topics Concern  ? Not on file  ?Social History Narrative  ? Not on file  ? ?Social Determinants of Health  ? ?Financial Resource Strain: Not on file  ?Food Insecurity: Not on file  ?Transportation Needs: Not on file  ?Physical Activity: Not on file  ?Stress: Not on file  ?Social Connections: Not on file  ?Intimate Partner Violence: Not on file  ? ? ?Outpatient Medications Prior to Visit  ?Medication Sig Dispense Refill  ? albuterol (VENTOLIN HFA) 108 (90 Base) MCG/ACT inhaler TAKE 2 PUFFS BY MOUTH EVERY 6 HOURS AS NEEDED FOR WHEEZE OR SHORTNESS OF BREATH 18 each 3  ? budesonide (PULMICORT FLEXHALER) 180 MCG/ACT inhaler TAKE 2 PUFFS BY MOUTH TWICE A DAY 1 each 5  ? diltiazem (TIAZAC) 120 MG 24 hr capsule Take by  mouth.    ? fenofibrate (TRICOR) 145 MG tablet TAKE 1 TABLET BY MOUTH EVERY DAY 90 tablet 1  ? metoprolol succinate (TOPROL-XL) 50 MG 24 hr tablet TAKE 1 TABLET BY MOUTH DAILY. TAKE WITH OR IMMEDIATELY FOLLOWING A MEAL. 90 tablet 1  ? montelukast (SINGULAIR) 10 MG tablet TAKE 1 TABLET BY MOUTH EVERYDAY AT BEDTIME 90 tablet 1  ? rivaroxaban (XARELTO) 20 MG TABS tablet Take by mouth.    ? valsartan-hydrochlorothiazide (DIOVAN-HCT) 160-12.5 MG tablet TAKE 1 TABLET BY MOUTH EVERY DAY 90 tablet 1  ? ?No facility-administered medications prior to visit.  ? ? ?Allergies  ?Allergen Reactions  ? Codeine Palpitations and Rash  ? ?Review of Systems ?All review of systems negative except what is listed in the HPI ? ? ?   ?Objective:  ?  ?Physical Exam ?Vitals reviewed.  ?Constitutional:   ?   Appearance: Normal appearance.  ?HENT:  ?   Head: Normocephalic and atraumatic.  ?   Comments: Frontal/maxillary sinuses tender to palpation ?   Right Ear: Tympanic membrane is erythematous.  ?   Left Ear: Tympanic membrane is erythematous.  ?   Nose: Congestion present.  ?   Mouth/Throat:  ?   Mouth: Mucous membranes are moist.  ?   Pharynx: Oropharynx is clear. No posterior oropharyngeal erythema.  ?Eyes:  ?   Extraocular Movements: Extraocular movements intact.  ?   Conjunctiva/sclera: Conjunctivae normal.  ?   Pupils: Pupils are equal, round, and reactive to light.  ?Cardiovascular:  ?   Rate and Rhythm: Normal rate and regular rhythm.  ?Pulmonary:  ?   Effort: Pulmonary effort is normal.  ?   Breath sounds: Normal breath sounds. No wheezing, rhonchi or rales.  ?Musculoskeletal:  ?   Cervical back: Normal range of motion and neck supple.  ?Skin: ?   General: Skin is warm and dry.  ?   Capillary Refill: Capillary refill takes less than 2 seconds.  ?Neurological:  ?   General: No focal deficit present.  ?   Mental Status: She is alert and oriented to person, place, and time. Mental status is at baseline.  ?Psychiatric:     ?   Mood and  Affect: Mood normal.     ?   Behavior: Behavior normal.     ?   Thought Content: Thought content normal.     ?   Judgment: Judgment normal.  ? ? ?BP (!) 153/85 (BP Location: Left Arm, Patient Position: Sitting, Cuff Size: Large) Comment: Taking sinus/decongestion meds  Pulse (!) 108  Temp 98.8 ?F (37.1 ?C)   Resp 20   Ht 5\' 8"  (1.727 m)   Wt 250 lb (113.4 kg)   SpO2 95%   BMI 38.01 kg/m?  ?Wt Readings from Last 3 Encounters:  ?04/07/21 250 lb (113.4 kg)  ?01/18/21 248 lb 3.2 oz (112.6 kg)  ?12/09/19 227 lb 12.8 oz (103.3 kg)  ? ? ?Health Maintenance Due  ?Topic Date Due  ? COLONOSCOPY (Pts [id number] Insurance coverage will need to be confirmed)  Never done  ? ? ?There are no preventive care reminders to display for this patient. ? ? ?Lab Results  ?Component Value Date  ? TSH 1.29 01/18/2021  ? ?Lab Results  ?Component Value Date  ? WBC 8.9 01/18/2021  ? HGB 13.7 01/18/2021  ? HCT 41.9 01/18/2021  ? MCV 91.0 01/18/2021  ? PLT 440.0 (H) 01/18/2021  ? ?Lab Results  ?Component Value Date  ? NA 140 01/18/2021  ? K 3.8 01/18/2021  ? CO2 29 01/18/2021  ? GLUCOSE 81 01/18/2021  ? BUN 13 01/18/2021  ? CREATININE 0.61 01/18/2021  ? BILITOT 0.5 01/18/2021  ? ALKPHOS 65 01/18/2021  ? AST 24 01/18/2021  ? ALT 30 01/18/2021  ? PROT 7.1 01/18/2021  ? ALBUMIN 4.3 01/18/2021  ? CALCIUM 9.6 01/18/2021  ? ANIONGAP 7 10/16/2015  ? GFR 96.27 01/18/2021  ? ?Lab Results  ?Component Value Date  ? CHOL 171 01/18/2021  ? ?Lab Results  ?Component Value Date  ? HDL 42.60 01/18/2021  ? ?Lab Results  ?Component Value Date  ? LDLCALC 95 01/18/2021  ? ?Lab Results  ?Component Value Date  ? TRIG 168.0 (H) 01/18/2021  ? ?Lab Results  ?Component Value Date  ? CHOLHDL 4 01/18/2021  ? ?Lab Results  ?Component Value Date  ? HGBA1C 5.1 01/18/2021  ? ? ?   ?Assessment & Plan:  ? ?1. Cough ?2. Acute non-recurrent pansinusitis ?Given duration and severity of symptoms, adding Augmentin (antibiotic), prednisone (low dose for now), cough syrup, and  refilling your albuterol. You report tolerating all of these medications well in the past, but continue to be mindful of side effects we discussed. Continue supportive measures including rest, hydration, humidifier us

## 2021-04-07 NOTE — Patient Instructions (Addendum)
Given duration and severity of symptoms, adding Augmentin (antibiotic), prednisone (low dose for now), cough syrup, and refilling your albuterol. You report tolerating all of these medications well in the past, but continue to be mindful of side effects we discussed. Continue supportive measures including rest, hydration, humidifier use, steam showers, warm compresses to sinuses, warm liquids with lemon and honey, and over-the-counter cough, cold, and analgesics as needed.   ? ?Please contact office for follow-up if symptoms do not improve or worsen. Seek emergency care if symptoms become severe. ? ?

## 2021-04-25 ENCOUNTER — Other Ambulatory Visit: Payer: Self-pay | Admitting: Family Medicine

## 2021-04-29 ENCOUNTER — Other Ambulatory Visit: Payer: Self-pay | Admitting: Family Medicine

## 2021-06-16 ENCOUNTER — Other Ambulatory Visit: Payer: Self-pay | Admitting: Family Medicine

## 2021-10-21 ENCOUNTER — Other Ambulatory Visit: Payer: Self-pay | Admitting: Family Medicine

## 2021-10-28 ENCOUNTER — Other Ambulatory Visit: Payer: Self-pay | Admitting: Family Medicine

## 2021-12-13 ENCOUNTER — Other Ambulatory Visit: Payer: Self-pay | Admitting: Family Medicine

## 2022-01-19 ENCOUNTER — Ambulatory Visit (INDEPENDENT_AMBULATORY_CARE_PROVIDER_SITE_OTHER): Payer: 59 | Admitting: Family Medicine

## 2022-01-19 ENCOUNTER — Ambulatory Visit (HOSPITAL_BASED_OUTPATIENT_CLINIC_OR_DEPARTMENT_OTHER)
Admission: RE | Admit: 2022-01-19 | Discharge: 2022-01-19 | Disposition: A | Discharge status: Home / Self Care | Payer: 59 | Source: Ambulatory Visit | Attending: Family Medicine | Admitting: Family Medicine

## 2022-01-19 ENCOUNTER — Other Ambulatory Visit: Payer: Self-pay

## 2022-01-19 ENCOUNTER — Other Ambulatory Visit (HOSPITAL_BASED_OUTPATIENT_CLINIC_OR_DEPARTMENT_OTHER): Payer: Self-pay | Admitting: Family Medicine

## 2022-01-19 VITALS — BP 128/82 | HR 97 | Temp 97.7°F | Resp 16 | Ht 69.0 in | Wt 255.6 lb

## 2022-01-19 DIAGNOSIS — M542 Cervicalgia: Secondary | ICD-10-CM

## 2022-01-19 DIAGNOSIS — Z23 Encounter for immunization: Secondary | ICD-10-CM

## 2022-01-19 DIAGNOSIS — R252 Cramp and spasm: Secondary | ICD-10-CM

## 2022-01-19 DIAGNOSIS — Z1211 Encounter for screening for malignant neoplasm of colon: Secondary | ICD-10-CM

## 2022-01-19 DIAGNOSIS — E785 Hyperlipidemia, unspecified: Secondary | ICD-10-CM

## 2022-01-19 DIAGNOSIS — Z0000 Encounter for general adult medical examination without abnormal findings: Secondary | ICD-10-CM | POA: Diagnosis not present

## 2022-01-19 DIAGNOSIS — R739 Hyperglycemia, unspecified: Secondary | ICD-10-CM

## 2022-01-19 DIAGNOSIS — R000 Tachycardia, unspecified: Secondary | ICD-10-CM

## 2022-01-19 DIAGNOSIS — I10 Essential (primary) hypertension: Secondary | ICD-10-CM

## 2022-01-19 DIAGNOSIS — Z1231 Encounter for screening mammogram for malignant neoplasm of breast: Secondary | ICD-10-CM

## 2022-01-19 LAB — CBC WITH DIFFERENTIAL/PLATELET
Basophils Absolute: 0.1 10*3/uL (ref 0.0–0.1)
Basophils Relative: 0.9 % (ref 0.0–3.0)
Eosinophils Absolute: 0.3 10*3/uL (ref 0.0–0.7)
Eosinophils Relative: 3.3 % (ref 0.0–5.0)
HCT: 41.2 % (ref 36.0–46.0)
Hemoglobin: 13.6 g/dL (ref 12.0–15.0)
Lymphocytes Relative: 21.2 % (ref 12.0–46.0)
Lymphs Abs: 2.1 10*3/uL (ref 0.7–4.0)
MCHC: 33.1 g/dL (ref 30.0–36.0)
MCV: 92 fl (ref 78.0–100.0)
Monocytes Absolute: 0.8 10*3/uL (ref 0.1–1.0)
Monocytes Relative: 8.2 % (ref 3.0–12.0)
Neutro Abs: 6.6 10*3/uL (ref 1.4–7.7)
Neutrophils Relative %: 66.4 % (ref 43.0–77.0)
Platelets: 424 10*3/uL — ABNORMAL HIGH (ref 150.0–400.0)
RBC: 4.48 Mil/uL (ref 3.87–5.11)
RDW: 13.5 % (ref 11.5–15.5)
WBC: 9.9 10*3/uL (ref 4.0–10.5)

## 2022-01-19 LAB — COMPREHENSIVE METABOLIC PANEL
ALT: 29 U/L (ref 0–35)
AST: 23 U/L (ref 0–37)
Albumin: 4.2 g/dL (ref 3.5–5.2)
Alkaline Phosphatase: 61 U/L (ref 39–117)
BUN: 17 mg/dL (ref 6–23)
CO2: 30 mEq/L (ref 19–32)
Calcium: 9.5 mg/dL (ref 8.4–10.5)
Chloride: 105 mEq/L (ref 96–112)
Creatinine, Ser: 0.67 mg/dL (ref 0.40–1.20)
GFR: 93.45 mL/min (ref >60.00)
Glucose, Bld: 92 mg/dL (ref 70–99)
Potassium: 3.7 mEq/L (ref 3.5–5.1)
Sodium: 144 mEq/L (ref 135–145)
Total Bilirubin: 0.5 mg/dL (ref 0.2–1.2)
Total Protein: 7.1 g/dL (ref 6.0–8.3)

## 2022-01-19 LAB — LIPID PANEL
Cholesterol: 176 mg/dL (ref 0–200)
HDL: 44.6 mg/dL (ref >39.00)
LDL Cholesterol: 100 mg/dL — ABNORMAL HIGH (ref 0–99)
NonHDL: 131.07
Total CHOL/HDL Ratio: 4
Triglycerides: 153 mg/dL — ABNORMAL HIGH (ref 0.0–149.0)
VLDL: 30.6 mg/dL (ref 0.0–40.0)

## 2022-01-19 LAB — TSH: TSH: 1.5 u[IU]/mL (ref 0.35–5.50)

## 2022-01-19 LAB — MAGNESIUM: Magnesium: 2.1 mg/dL (ref 1.5–2.5)

## 2022-01-19 LAB — HEMOGLOBIN A1C: Hgb A1c MFr Bld: 5.1 % (ref 4.6–6.5)

## 2022-01-19 MED ORDER — TIZANIDINE HCL 2 MG PO TABS: 1.0000 mg | ORAL_TABLET | Freq: Three times a day (TID) | ORAL | 0 refills | Status: DC | PRN

## 2022-01-19 NOTE — Assessment & Plan Note (Addendum)
Patient encouraged to maintain heart healthy diet, regular exercise, adequate sleep. Consider daily probiotics. Take medications as prescribed.  Set up for Cologuard again MGM due 02/2022 Pap 01/2021 repeat in 3-4 years Dexa 01/2021 repeat in 2-3 years

## 2022-01-19 NOTE — Assessment & Plan Note (Signed)
RRR today 

## 2022-01-19 NOTE — Assessment & Plan Note (Signed)
Encourage heart healthy diet such as MIND or DASH diet, increase exercise, avoid trans fats, simple carbohydrates and processed foods, consider a krill or fish or flaxseed oil cap daily.  °

## 2022-01-19 NOTE — Assessment & Plan Note (Signed)
Well controlled, no changes to meds. Encouraged heart healthy diet such as the DASH diet and exercise as tolerated.  °

## 2022-01-19 NOTE — Progress Notes (Signed)
Subjective:   By signing my name below, I, [name], attest that this documentation has been prepared under the direction and in the presence of Mosie Lukes, MD., 01/19/2022.     Patient ID: Kathleen Church, female    DOB: 1960/07/23, 62 y.o.   MRN: [id number]  Chief Complaint  Patient presents with   Annual Exam    Annual Exam   HPI Patient is in today for a comprehensive physical exam and follow up on chronic medical concerns. She denies CP/palpitations/SOB/HA/congestion/fever/ chills/GI or GU symptoms.  Family History No changes to the family history.  Knee Pain (Bilateral) Patient reports having bilateral knee pain. She underwent a left total knee arthroplasty on 10/15/2015 and is planning on following through with a right total knee arthroplasty if the pain in her right knee worsens.  Neck Pain Patient reports she has been experiencing pain in her neck for the past several months and suspects she has pulled a muscle. 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 She is interested in completing a cervical spine x-ray.  Past Medical History:  Diagnosis Date   Allergic state    Anxiety    when father passed   Arthritis    Asthma, mild persistent    allergy induced asthma    Cervical cancer screening 04/27/2011   Edema 04/27/2011   Heart murmur    as an infant    Hyperlipidemia 04/27/2011   Hypertension 2010   Hypokalemia 04/27/2011   Measles as a child   Mumps as a child   Muscle cramp 10/14/2015   Obesity    Osteoarthritis of left knee 10/14/2015   Plantar fasciitis, left 01/19/2013   Preventative health care 02/13/2011   Sinusitis 02/13/2011    Past Surgical History:  Procedure Laterality Date   ENDOMETRIAL ABLATION  2006   menorraghia   KNEE SURGERY  2006   left knee   TONSILLECTOMY     TOTAL KNEE ARTHROPLASTY Left 10/15/2015   Procedure: LEFT TOTAL KNEE ARTHROPLASTY;  Surgeon: Sydnee Cabal, MD;  Location: [facility];  Service: Orthopedics;   Laterality: Left;   TUBAL LIGATION      Family History  Problem Relation Age of Onset   Cancer Father        kidney   Cancer Maternal Grandmother        bone   Factor V Leiden deficiency Maternal Grandmother    Cancer Paternal Grandmother        breast   Breast cancer Paternal Grandmother    Asthma Daughter    Heart attack Paternal Grandfather    Cancer Mother 30       metastatic lung cancer   Stroke Mother    Asthma Daughter    Factor V Leiden deficiency Maternal Aunt     Social History   Socioeconomic History   Marital status: Widowed    Spouse name: Not on file   Number of children: Not on file   Years of education: Not on file   Highest education level: Not on file  Occupational History   Not on file  Tobacco Use   Smoking status: Never   Smokeless tobacco: Never  Substance and Sexual Activity   Alcohol use: Yes    Comment: occas    Drug use: No   Sexual activity: Yes    Partners: Male  Other Topics Concern   Not on file  Social History Narrative   Not on file  Social Determinants of Health   Financial Resource Strain: Not on file  Food Insecurity: Not on file  Transportation Needs: Not on file  Physical Activity: Not on file  Stress: Not on file  Social Connections: Not on file  Intimate Partner Violence: Not on file    Outpatient Medications Prior to Visit  Medication Sig Dispense Refill   albuterol (VENTOLIN HFA) 108 (90 Base) MCG/ACT inhaler TAKE 2 PUFFS BY MOUTH EVERY 6 HOURS AS NEEDED FOR WHEEZE OR SHORTNESS OF BREATH 18 each 3   budesonide (PULMICORT FLEXHALER) 180 MCG/ACT inhaler TAKE 2 PUFFS BY MOUTH TWICE A DAY 1 each 5   diltiazem (TIAZAC) 120 MG 24 hr capsule Take by mouth.     fenofibrate (TRICOR) 145 MG tablet TAKE 1 TABLET BY MOUTH EVERY DAY 90 tablet 1   metoprolol succinate (TOPROL-XL) 50 MG 24 hr tablet TAKE 1 TABLET BY MOUTH DAILY. TAKE WITH OR IMMEDIATELY FOLLOWING A MEAL. 90 tablet 1   montelukast (SINGULAIR) 10 MG tablet  TAKE 1 TABLET BY MOUTH EVERYDAY AT BEDTIME 90 tablet 1   rivaroxaban (XARELTO) 20 MG TABS tablet Take by mouth.     valsartan-hydrochlorothiazide (DIOVAN-HCT) 160-12.5 MG tablet TAKE 1 TABLET BY MOUTH EVERY DAY 90 tablet 1   No facility-administered medications prior to visit.    Allergies  Allergen Reactions   Codeine Palpitations and Rash    Review of Systems  Constitutional:  Negative for chills and fever.  HENT:  Negative for congestion.   Respiratory:  Negative for shortness of breath.   Cardiovascular:  Negative for chest pain and palpitations.  Gastrointestinal:  Negative for abdominal pain, blood in stool, constipation, diarrhea, nausea and vomiting.  Genitourinary:  Negative for dysuria, frequency, hematuria and urgency.  Musculoskeletal:  Positive for joint pain (bilateral knees) and neck pain.  Skin:           Neurological:  Negative for headaches.      Objective:    Physical Exam Constitutional:      General: She is not in acute distress.    Appearance: Normal appearance. She is normal weight. She is not ill-appearing.  HENT:     Head: Normocephalic and atraumatic.     Right Ear: Tympanic membrane, ear canal and external ear normal.     Left Ear: Tympanic membrane, ear canal and external ear normal.     Nose: Nose normal.     Mouth/Throat:     Mouth: Mucous membranes are moist.     Pharynx: Oropharynx is clear.  Eyes:     General:        Right eye: No discharge.        Left eye: No discharge.     Extraocular Movements: Extraocular movements intact.     Right eye: No nystagmus.     Left eye: No nystagmus.     Pupils: Pupils are equal, round, and reactive to light.  Neck:     Vascular: No carotid bruit.  Cardiovascular:     Rate and Rhythm: Normal rate and regular rhythm.     Pulses: Normal pulses.     Heart sounds: Normal heart sounds. No murmur heard.    No gallop.  Pulmonary:     Effort: Pulmonary effort is normal. No respiratory distress.      Breath sounds: Normal breath sounds. No wheezing or rales.  Abdominal:     General: Bowel sounds are normal.     Palpations: Abdomen is soft.  Tenderness: There is no abdominal tenderness. There is no guarding.  Musculoskeletal:        General: Normal range of motion.     Cervical back: Normal range of motion.     Right lower leg: No edema.     Left lower leg: No edema.     Comments: Muscle strength 5/5 on upper and lower extremities.   Lymphadenopathy:     Cervical: No cervical adenopathy.  Skin:    General: Skin is warm and dry.  Neurological:     Mental Status: She is alert and oriented to person, place, and time.     Sensory: Sensation is intact.     Motor: Motor function is intact.     Coordination: Coordination is intact.     Deep Tendon Reflexes:     Reflex Scores:      Patellar reflexes are 2+ on the right side and 2+ on the left side. Psychiatric:        Mood and Affect: Mood normal.        Behavior: Behavior normal.        Judgment: Judgment normal.     BP 128/82 (BP Location: Right Arm, Patient Position: Sitting, Cuff Size: Normal)   Pulse 97   Temp 97.7 F (36.5 C) (Oral)   Resp 16   Ht 5\' 9"  (1.753 m)   Wt 255 lb 9.6 oz (115.9 kg)   SpO2 95%   BMI 37.75 kg/m  Wt Readings from Last 3 Encounters:  01/19/22 255 lb 9.6 oz (115.9 kg)  04/07/21 250 lb (113.4 kg)  01/18/21 248 lb 3.2 oz (112.6 kg)    Diabetic Foot Exam - Simple   No data filed    Lab Results  Component Value Date   WBC 8.9 01/18/2021   HGB 13.7 01/18/2021   HCT 41.9 01/18/2021   PLT 440.0 (H) 01/18/2021   GLUCOSE 81 01/18/2021   CHOL 171 01/18/2021   TRIG 168.0 (H) 01/18/2021   HDL 42.60 01/18/2021   LDLDIRECT 101.0 11/25/2019   LDLCALC 95 01/18/2021   ALT 30 01/18/2021   AST 24 01/18/2021   NA 140 01/18/2021   K 3.8 01/18/2021   CL 102 01/18/2021   CREATININE 0.61 01/18/2021   BUN 13 01/18/2021   CO2 29 01/18/2021   TSH 1.29 01/18/2021   INR 1.03 10/08/2015   HGBA1C  5.1 01/18/2021    Lab Results  Component Value Date   TSH 1.29 01/18/2021   Lab Results  Component Value Date   WBC 8.9 01/18/2021   HGB 13.7 01/18/2021   HCT 41.9 01/18/2021   MCV 91.0 01/18/2021   PLT 440.0 (H) 01/18/2021   Lab Results  Component Value Date   NA 140 01/18/2021   K 3.8 01/18/2021   CO2 29 01/18/2021   GLUCOSE 81 01/18/2021   BUN 13 01/18/2021   CREATININE 0.61 01/18/2021   BILITOT 0.5 01/18/2021   ALKPHOS 65 01/18/2021   AST 24 01/18/2021   ALT 30 01/18/2021   PROT 7.1 01/18/2021   ALBUMIN 4.3 01/18/2021   CALCIUM 9.6 01/18/2021   ANIONGAP 7 10/16/2015   GFR 96.27 01/18/2021   Lab Results  Component Value Date   CHOL 171 01/18/2021   Lab Results  Component Value Date   HDL 42.60 01/18/2021   Lab Results  Component Value Date   LDLCALC 95 01/18/2021   Lab Results  Component Value Date   TRIG 168.0 (H) 01/18/2021   Lab Results  Component Value Date   CHOLHDL 4 01/18/2021   Lab Results  Component Value Date   HGBA1C 5.1 01/18/2021      Assessment & Plan:  Colonoscopy: Encouraged patient to completed a colonoscopy. She is interested in completing a Cologuard.  DEXA: Last completed on 02/15/2021. The BMD measured at Femur Total Right is 0.860 g/cm2 with a T-score of -1.2. This patient is considered osteopenic according to [facility]) criteria. L-3 & 4 was excluded due to degenerative changes. The scan quality is good. Repeat in 2-5 years. Encouraged patient to consume appropriate portions of calcium and vitamin D.   Mammogram: Last completed on 02/15/2021 with no mammographic evidence of malignancy. Repeat in 1-2 years.   Pap Smear: Last completed on 01/18/2021 with normal results. Repeat in 3-5 years.  Advanced Directives: Provided patient with advanced care planning documents and encouraged them to complete these.  Healthy Lifestyle: Encouraged adequate sleep, exercise, heart healthy diets such as the DASH and  PURE diets, and hydration.  Immunizations: Reviewed patient's immunization history. Encouraged Prevnar 43 and RSV immunizations. Prevnar 20 was administered today in office.  Labs: Routine blood work will be completed today.  Neck Pain: Recommended moist heat and gentle stretching. Tizanidine 2 mg has been prescribed and a cervical spine x-ray has been ordered today.  Problem List Items Addressed This Visit     Hypertension    Well controlled, no changes to meds. Encouraged heart healthy diet such as the DASH diet and exercise as tolerated.        Preventative health care    Patient encouraged to maintain heart healthy diet, regular exercise, adequate sleep. Consider daily probiotics. Take medications as prescribed.  Set up for Cologuard again MGM due 02/2022 Pap 01/2021 repeat in 3-4 years Dexa 01/2021 repeat in 2-3 years      Hyperlipidemia    Encourage heart healthy diet such as MIND or DASH diet, increase exercise, avoid trans fats, simple carbohydrates and processed foods, consider a krill or fish or flaxseed oil cap daily.       Relevant Orders   CBC with Differential/Platelet   Comprehensive metabolic panel   TSH   Lipid panel   Tachycardia    RRR today      Muscle cramp    Hydrate and monitor       Relevant Orders   Magnesium   Hyperglycemia - Primary    hgba1c acceptable, minimize simple carbs. Increase exercise as tolerated. Continue current meds      Relevant Orders   Hemoglobin A1c   Neck pain    Encouraged moist heat and gentle stretching as tolerated. May try NSAIDs and prescription meds as directed and report if symptoms worsen or seek immediate care, xray and Tizanidine prn      Relevant Orders   DG Cervical Spine Complete   Other Visit Diagnoses     Need for pneumococcal 20-valent conjugate vaccination       Relevant Orders   Pneumococcal conjugate vaccine 20-valent (Prevnar 20) (Completed)      Meds ordered this encounter  Medications    tiZANidine (ZANAFLEX) 2 MG tablet    Sig: Take 0.5-2 tablets (1-4 mg total) by mouth every 8 (eight) hours as needed for muscle spasms.    Dispense:  30 tablet    Refill:  0   I, Penni Homans, MD, personally preformed the services described in this documentation.  All medical record entries made by the scribe were at my direction  and in my presence.  I have reviewed the chart and discharge instructions (if applicable) and agree that the record reflects my personal performance and is accurate and complete. 01/19/2022  I,Mohammed Iqbal,acting as a scribe for Danise Edge, MD.,have documented all relevant documentation on the behalf of Danise Edge, MD,as directed by  Danise Edge, MD while in the presence of Danise Edge, MD.  Danise Edge, MD

## 2022-01-19 NOTE — Assessment & Plan Note (Signed)
Encouraged moist heat and gentle stretching as tolerated. May try NSAIDs and prescription meds as directed and report if symptoms worsen or seek immediate care, xray and Tizanidine prn

## 2022-01-19 NOTE — Patient Instructions (Addendum)
Bone density shows osteopenia, which is thinner than normal but not as bad as osteoporosis. Recommend calcium intake of 1200 to 1500 mg daily, divided into roughly 3 doses. Best source is the diet and a single dairy serving is about 500 mg, a supplement of calcium citrate once or twice daily to balance diet is fine if not getting enough in diet. Also need Vitamin D 2000 IU caps, 1 cap daily if not already taking vitamin D. Also recommend weight baring exercise on hips and upper body to keep bones strong  Consider RSV, Respiratory Syncitial Virus vaccine, Arexvy 60-80 ounces Hydrate daily 4000 to 8000 steps + Preventive Care 54-55 Years Old, Female Preventive care refers to lifestyle choices and visits with your health care provider that can promote health and wellness. Preventive care visits are also called wellness exams. What can I expect for my preventive care visit? Counseling Your health care provider may ask you questions about your: Medical history, including: Past medical problems. Family medical history. Pregnancy history. Current health, including: Menstrual cycle. Method of birth control. Emotional well-being. Home life and relationship well-being. Sexual activity and sexual health. Lifestyle, including: Alcohol, nicotine or tobacco, and drug use. Access to firearms. Diet, exercise, and sleep habits. Work and work Statistician. Sunscreen use. Safety issues such as seatbelt and bike helmet use. Physical exam Your health care provider will check your: Height and weight. These may be used to calculate your BMI (body mass index). BMI is a measurement that tells if you are at a healthy weight. Waist circumference. This measures the distance around your waistline. This measurement also tells if you are at a healthy weight and may help predict your risk of certain diseases, such as type 2 diabetes and high blood pressure. Heart rate and blood pressure. Body temperature. Skin for  abnormal spots. What immunizations do I need?  Vaccines are usually given at various ages, according to a schedule. Your health care provider will recommend vaccines for you based on your age, medical history, and lifestyle or other factors, such as travel or where you work. What tests do I need? Screening Your health care provider may recommend screening tests for certain conditions. This may include: Lipid and cholesterol levels. Diabetes screening. This is done by checking your blood sugar (glucose) after you have not eaten for a while (fasting). Pelvic exam and Pap test. Hepatitis B test. Hepatitis C test. HIV (human immunodeficiency virus) test. STI (sexually transmitted infection) testing, if you are at risk. Lung cancer screening. Colorectal cancer screening. Mammogram. Talk with your health care provider about when you should start having regular mammograms. This may depend on whether you have a family history of breast cancer. BRCA-related cancer screening. This may be done if you have a family history of breast, ovarian, tubal, or peritoneal cancers. Bone density scan. This is done to screen for osteoporosis. Talk with your health care provider about your test results, treatment options, and if necessary, the need for more tests. Follow these instructions at home: Eating and drinking  Eat a diet that includes fresh fruits and vegetables, whole grains, lean protein, and low-fat dairy products. Take vitamin and mineral supplements as recommended by your health care provider. Do not drink alcohol if: Your health care provider tells you not to drink. You are pregnant, may be pregnant, or are planning to become pregnant. If you drink alcohol: Limit how much you have to 0-1 drink a day. Know how much alcohol is in your drink. In the [place]  one drink equals one 12 oz bottle of beer (355 mL), one 5 oz glass of wine (148 mL), or one 1 oz glass of hard liquor (44 mL). Lifestyle Brush  your teeth every morning and night with fluoride toothpaste. Floss one time each day. Exercise for at least 30 minutes 5 or more days each week. Do not use any products that contain nicotine or tobacco. These products include cigarettes, chewing tobacco, and vaping devices, such as e-cigarettes. If you need help quitting, ask your health care provider. Do not use drugs. If you are sexually active, practice safe sex. Use a condom or other form of protection to prevent STIs. If you do not wish to become pregnant, use a form of birth control. If you plan to become pregnant, see your health care provider for a prepregnancy visit. Take aspirin only as told by your health care provider. Make sure that you understand how much to take and what form to take. Work with your health care provider to find out whether it is safe and beneficial for you to take aspirin daily. Find healthy ways to manage stress, such as: Meditation, yoga, or listening to music. Journaling. Talking to a trusted person. Spending time with friends and family. Minimize exposure to UV radiation to reduce your risk of skin cancer. Safety Always wear your seat belt while driving or riding in a vehicle. Do not drive: If you have been drinking alcohol. Do not ride with someone who has been drinking. When you are tired or distracted. While texting. If you have been using any mind-altering substances or drugs. Wear a helmet and other protective equipment during sports activities. If you have firearms in your house, make sure you follow all gun safety procedures. Seek help if you have been physically or sexually abused. What's next? Visit your health care provider once a year for an annual wellness visit. Ask your health care provider how often you should have your eyes and teeth checked. Stay up to date on all vaccines. This information is not intended to replace advice given to you by your health care provider. Make sure you discuss  any questions you have with your health care provider. Document Revised: 06/23/2020 Document Reviewed: 06/23/2020 Elsevier Patient Education  [place].

## 2022-01-19 NOTE — Assessment & Plan Note (Signed)
Hydrate and monitor 

## 2022-01-19 NOTE — Assessment & Plan Note (Signed)
hgba1c acceptable, minimize simple carbs. Increase exercise as tolerated. Continue current meds 

## 2022-02-13 LAB — COLOGUARD: COLOGUARD: NEGATIVE

## 2022-02-21 ENCOUNTER — Ambulatory Visit (HOSPITAL_BASED_OUTPATIENT_CLINIC_OR_DEPARTMENT_OTHER)
Admission: RE | Admit: 2022-02-21 | Discharge: 2022-02-21 | Disposition: A | Discharge status: Home / Self Care | Payer: 59 | Source: Ambulatory Visit | Attending: Family Medicine | Admitting: Family Medicine

## 2022-02-21 ENCOUNTER — Encounter (HOSPITAL_BASED_OUTPATIENT_CLINIC_OR_DEPARTMENT_OTHER): Payer: Self-pay

## 2022-02-21 DIAGNOSIS — Z1231 Encounter for screening mammogram for malignant neoplasm of breast: Secondary | ICD-10-CM | POA: Diagnosis present

## 2022-03-06 IMAGING — CR DG CHEST 2V
2 series · 2 of 2 positions shown · non-contrast
Comparison: Chest radiographs 06/01/2016.

CLINICAL DATA: 59-year-old female discharged from the hospital 1
week ago after treatment for MWBR7-BH. Persistent cough and
shortness of breath.

EXAM:
CHEST - 2 VIEW

[w chest pa]
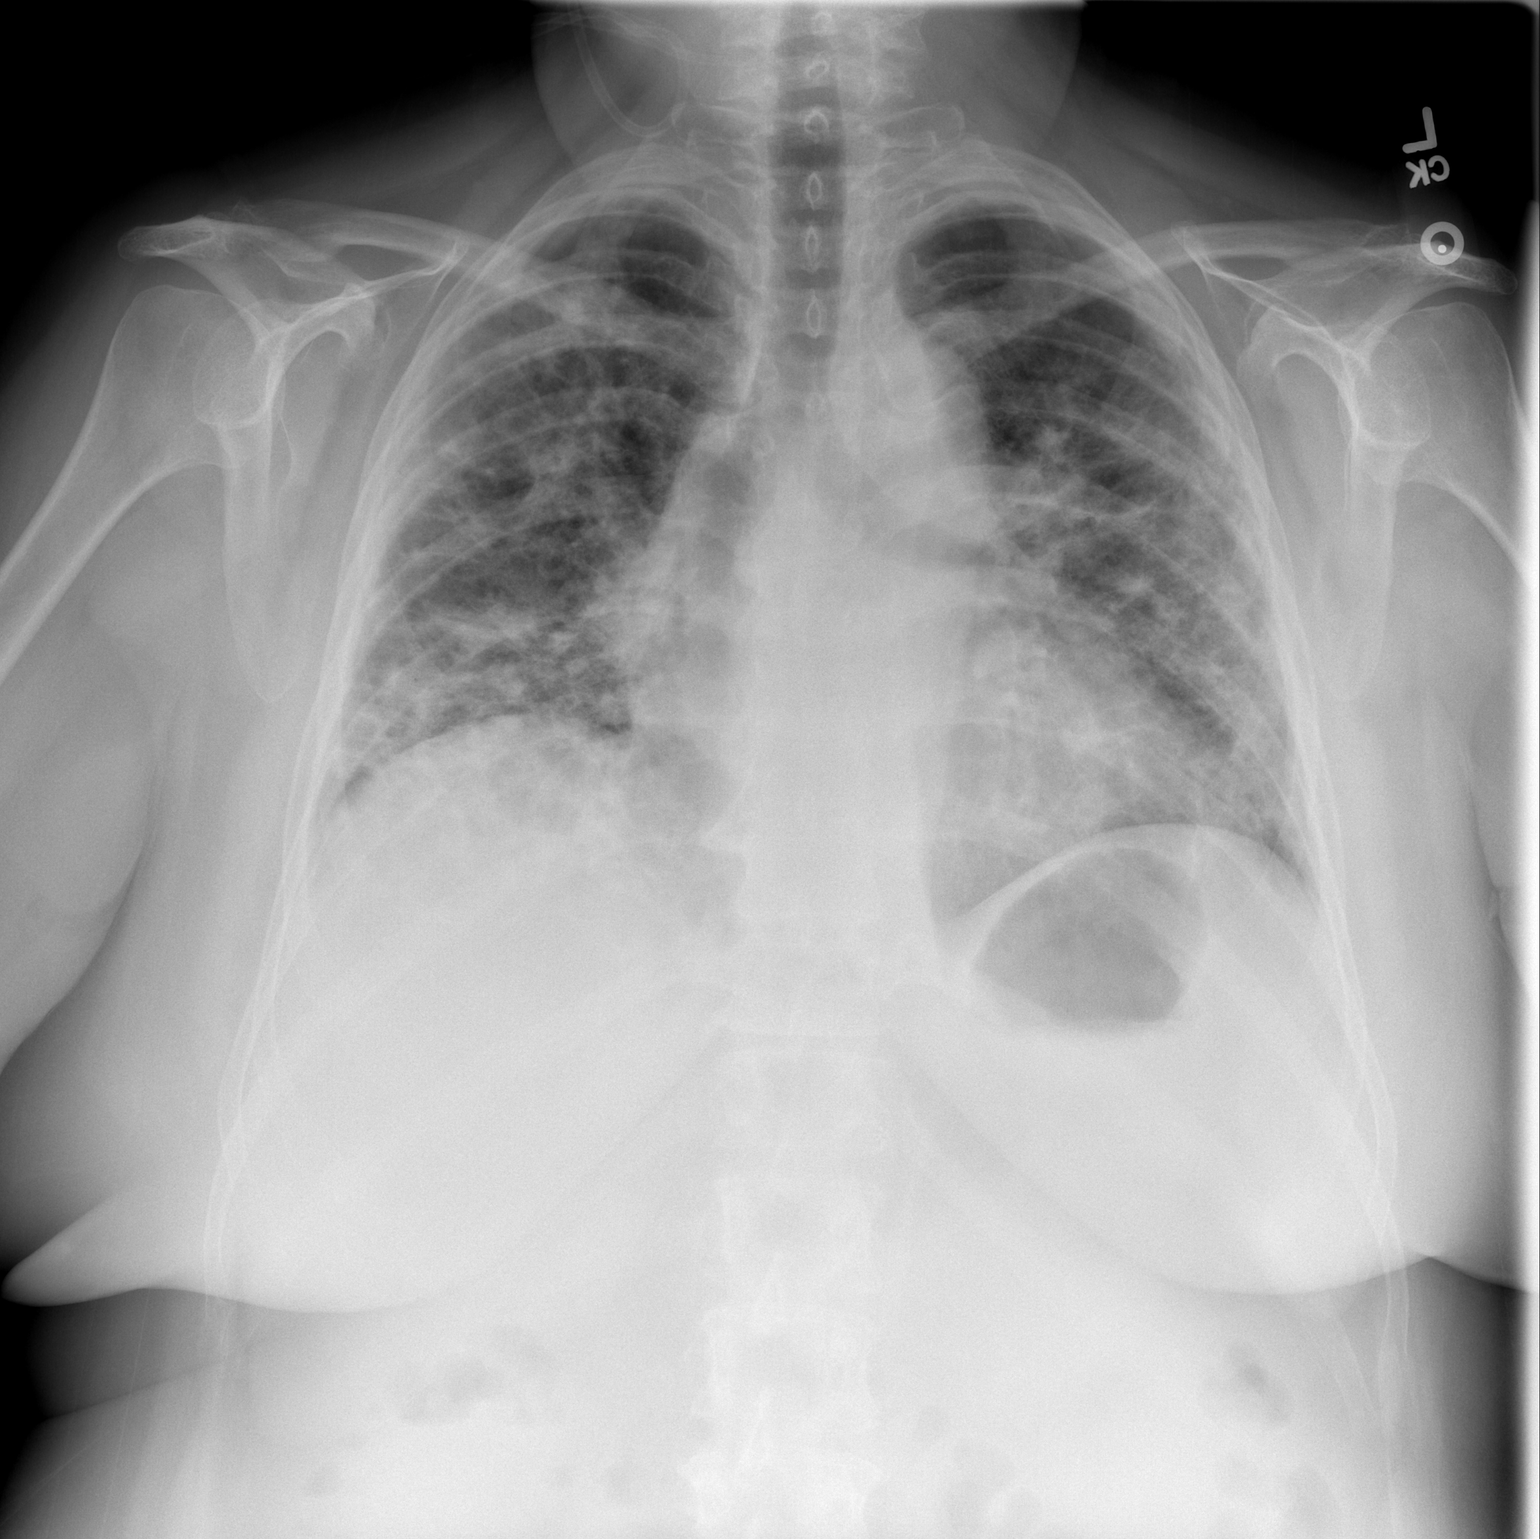

[w chest lat]
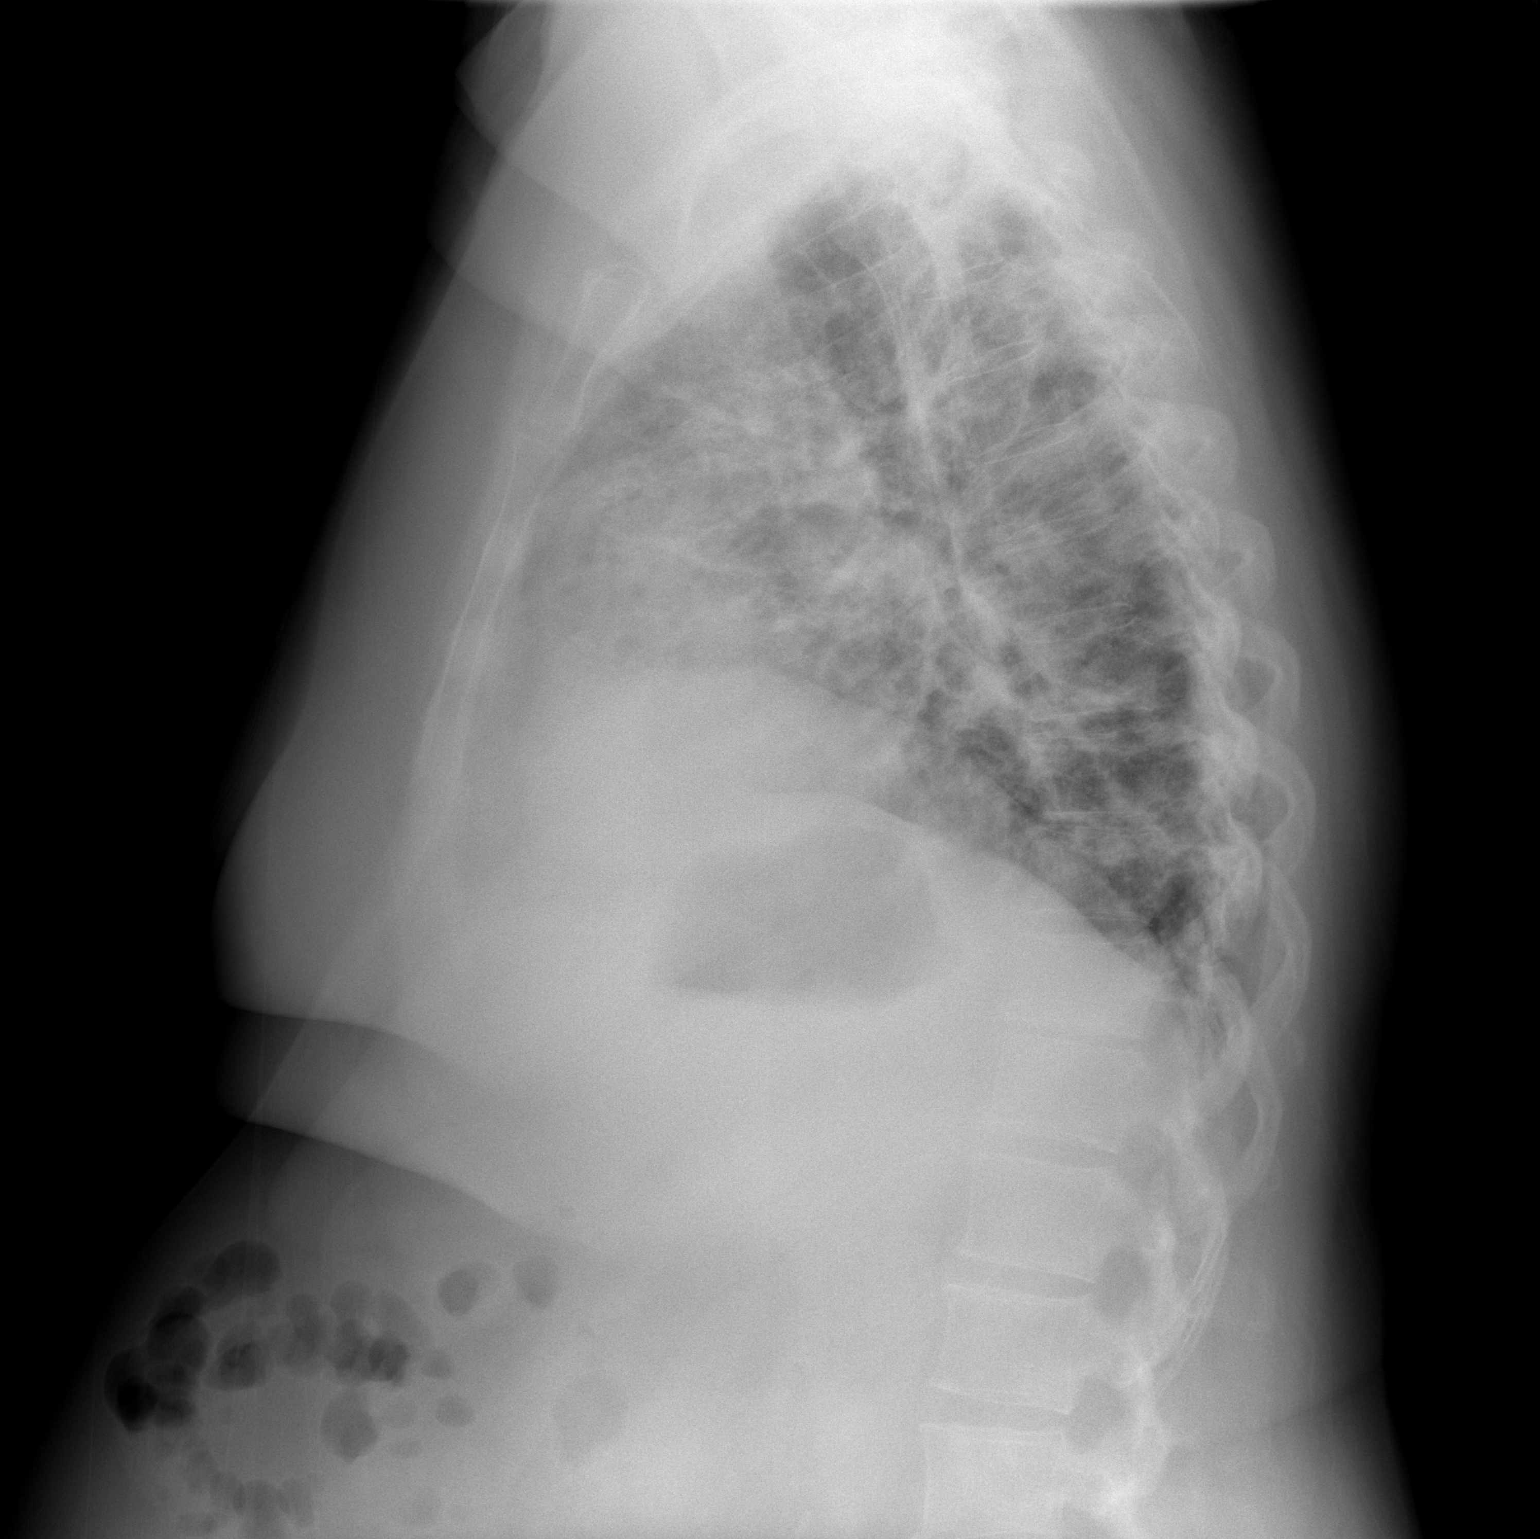

[2 of 2 positions shown; findings below may reference images not displayed]

FINDINGS: Lower lung volumes. No pneumothorax or pleural effusion. Mediastinal
contours are stable and within normal limits widespread coarse and
patchy bilateral pulmonary opacity, peripheral and basilar
predominant. The lung apices are mildly spared (more so the left).

No acute osseous abnormality identified. Negative visible bowel gas
pattern.
IMPRESSION: Decreased lung volumes with widespread bilateral coarse and patchy
pulmonary opacity compatible with sequelae of severe MWBR7-BH
pneumonia. No pleural effusion.

## 2022-04-25 ENCOUNTER — Other Ambulatory Visit: Payer: Self-pay | Admitting: Family Medicine

## 2022-07-20 ENCOUNTER — Ambulatory Visit: Payer: 59 | Admitting: Family Medicine

## 2022-10-25 ENCOUNTER — Other Ambulatory Visit: Payer: Self-pay | Admitting: Family Medicine

## 2023-01-23 ENCOUNTER — Other Ambulatory Visit (HOSPITAL_BASED_OUTPATIENT_CLINIC_OR_DEPARTMENT_OTHER): Payer: Self-pay | Admitting: Family Medicine

## 2023-01-23 DIAGNOSIS — Z1231 Encounter for screening mammogram for malignant neoplasm of breast: Secondary | ICD-10-CM

## 2023-01-24 ENCOUNTER — Encounter: Payer: 59 | Admitting: Family Medicine

## 2023-01-24 NOTE — Progress Notes (Signed)
Complete physical exam   Patient: Kathleen Church   DOB: [birth year]   63 y.o. Female  MRN: [id number] Visit Date: 01/25/2023  Today's healthcare provider: Alfredia Ferguson, PA-C   Cc. Cpe, bleeding  Subjective    Kathleen Church is a 63 y.o. female who presents today for a complete physical exam.  Patient with a history of an ablation for heavy menstrual bleeding over 20 years ago presents today with postmenopausal spotting.  She has little bit of pain while wiping after urination few weeks ago but then did notice darker pink the same to her underpants the week before Christmas.  She has not noticed anything since then.   Past Medical History:  Diagnosis Date   Allergic state    Anxiety    when father passed   Arthritis    Asthma, mild persistent    allergy induced asthma    Cervical cancer screening 04/27/2011   Edema 04/27/2011   Heart murmur    as an infant    Hyperlipidemia 04/27/2011   Hypertension 2010   Hypokalemia 04/27/2011   Measles as a child   Mumps as a child   Muscle cramp 10/14/2015   Obesity    Osteoarthritis of left knee 10/14/2015   Plantar fasciitis, left 01/19/2013   Preventative health care 02/13/2011   Sinusitis 02/13/2011   Past Surgical History:  Procedure Laterality Date   ENDOMETRIAL ABLATION  2006   menorraghia   KNEE SURGERY  2006   left knee   TONSILLECTOMY     TOTAL KNEE ARTHROPLASTY Left 10/15/2015   Procedure: LEFT TOTAL KNEE ARTHROPLASTY;  Surgeon: Eugenia Mcalpine, MD;  Location: [facility];  Service: Orthopedics;  Laterality: Left;   TUBAL LIGATION     Social History   Socioeconomic History   Marital status: Widowed    Spouse name: Not on file   Number of children: Not on file   Years of education: Not on file   Highest education level: Not on file  Occupational History   Not on file  Tobacco Use   Smoking status: Never   Smokeless tobacco: Never  Substance and Sexual Activity   Alcohol use: Yes    Comment: occas    Drug use: No    Sexual activity: Yes    Partners: Male  Other Topics Concern   Not on file  Social History Narrative   Not on file   Social Drivers of Health   Financial Resource Strain: Low Risk  (09/04/2019)   Received from [facility], [facility]   Overall Financial Resource Strain (CARDIA)    Difficulty of Paying Living Expenses: Not very hard  Food Insecurity: No Food Insecurity (01/13/2021)   Received from [facility], [facility]   Hunger Vital Sign    Worried About Running Out of Food in the Last Year: Never true    Ran Out of Food in the Last Year: Never true  Transportation Needs: Not on file  Physical Activity: Not on file  Stress: No Stress Concern Present (10/08/2019)   Received from [facility], [facility]   [employer] of Occupational Health - Occupational Stress Questionnaire    Feeling of Stress : Only a little  Social Connections: Unknown (05/22/2021)   Received from [facility], [facility]   Social Network    Social Network: Not on file  Intimate Partner Violence: Unknown (04/13/2021)   Received from [facility], [facility]   HITS    Physically Hurt: Not  on file    Insult or Talk Down To: Not on file    Threaten Physical Harm: Not on file    Scream or Curse: Not on file   Family Status  Relation Name Status   Father  Deceased at age 53   MGM  Deceased at age 72   PGM  Deceased at age 15   Daughter Kathleen Church Alive       75   PGF  Deceased at age early 63's   Mother 63 Deceased       71/ healthy   Brother  Alive       49/ healthy   Daughter Kathleen Church Alive       2023/02/24 healthy   MGF  Deceased       unknown   Mat Aunt  Deceased  No partnership data on file   Family History  Problem Relation Age of Onset   Cancer Father        kidney   Cancer Maternal Grandmother        bone   Factor V Leiden deficiency Maternal Grandmother    Cancer Paternal Grandmother        breast   Breast cancer Paternal Grandmother    Asthma Daughter    Heart attack  Paternal Grandfather    Cancer Mother 60       metastatic lung cancer   Stroke Mother    Asthma Daughter    Factor V Leiden deficiency Maternal Aunt    Allergies  Allergen Reactions   Codeine Palpitations and Rash    Patient Care Team: Bradd Canary, MD as PCP - General (Family Medicine)   Medications: Outpatient Medications Prior to Visit  Medication Sig   albuterol (VENTOLIN HFA) 108 (90 Base) MCG/ACT inhaler TAKE 2 PUFFS BY MOUTH EVERY 6 HOURS AS NEEDED FOR WHEEZE OR SHORTNESS OF BREATH   diltiazem (TIAZAC) 120 MG 24 hr capsule Take by mouth.   fenofibrate (TRICOR) 145 MG tablet Take 1 tablet (145 mg total) by mouth daily.   metoprolol succinate (TOPROL-XL) 50 MG 24 hr tablet TAKE 1 TABLET BY MOUTH DAILY. TAKE WITH OR IMMEDIATELY FOLLOWING A MEAL.   montelukast (SINGULAIR) 10 MG tablet Take 1 tablet (10 mg total) by mouth at bedtime.   rivaroxaban (XARELTO) 20 MG TABS tablet Take by mouth.   tiZANidine (ZANAFLEX) 2 MG tablet Take 0.5-2 tablets (1-4 mg total) by mouth every 8 (eight) hours as needed for muscle spasms.   valsartan-hydrochlorothiazide (DIOVAN-HCT) 160-12.5 MG tablet TAKE 1 TABLET BY MOUTH EVERY DAY   [DISCONTINUED] budesonide (PULMICORT FLEXHALER) 180 MCG/ACT inhaler TAKE 2 PUFFS BY MOUTH TWICE A DAY   No facility-administered medications prior to visit.    Review of Systems  Constitutional:  Negative for fatigue and fever.  Respiratory:  Negative for cough and shortness of breath.   Cardiovascular:  Negative for chest pain and leg swelling.  Gastrointestinal:  Negative for abdominal pain.  Genitourinary:  Positive for menstrual problem.  Neurological:  Negative for dizziness and headaches.       Objective    BP (!) 158/98   Pulse 94   Temp 97.7 F (36.5 C) (Oral)   Ht 5\' 9"  (1.753 m)   Wt 251 lb 2 oz (113.9 kg)   SpO2 91%   BMI 37.08 kg/m     Physical Exam Constitutional:      General: She is awake.     Appearance: She is well-developed. She  is not ill-appearing.  HENT:     Head: Normocephalic.     Right Ear: Tympanic membrane normal.     Left Ear: Tympanic membrane normal.     Nose: Nose normal. No congestion or rhinorrhea.     Mouth/Throat:     Pharynx: No oropharyngeal exudate or posterior oropharyngeal erythema.  Eyes:     Conjunctiva/sclera: Conjunctivae normal.     Pupils: Pupils are equal, round, and reactive to light.  Neck:     Thyroid: No thyroid mass or thyromegaly.  Cardiovascular:     Rate and Rhythm: Normal rate and regular rhythm.     Heart sounds: Normal heart sounds.  Pulmonary:     Effort: Pulmonary effort is normal.     Breath sounds: Normal breath sounds.  Abdominal:     Palpations: Abdomen is soft.     Tenderness: There is no abdominal tenderness.  Musculoskeletal:     Right lower leg: No swelling. No edema.     Left lower leg: No swelling. No edema.  Lymphadenopathy:     Cervical: No cervical adenopathy.  Skin:    General: Skin is warm.  Neurological:     Mental Status: She is alert and oriented to person, place, and time.  Psychiatric:        Attention and Perception: Attention normal.        Mood and Affect: Mood normal.        Speech: Speech normal.        Behavior: Behavior normal. Behavior is cooperative.      Last depression screening scores    01/25/2023    9:17 AM 01/19/2022    8:23 AM 01/18/2021   11:10 AM  PHQ 2/9 Scores  PHQ - 2 Score 0 0 0  PHQ- 9 Score  0 0   Last fall risk screening    01/25/2023    9:17 AM  Fall Risk   Number falls in past yr: 0  Injury with Fall? 0  Risk for fall due to : No Fall Risks  Follow up Falls evaluation completed   Last Audit-C alcohol use screening     No data to display         A score of 3 or more in women, and 4 or more in men indicates increased risk for alcohol abuse, EXCEPT if all of the points are from question 1   Results for orders placed or performed in visit on 01/25/23  CBC w/Diff  Result Value Ref Range   WBC  9.9 4.0 - 10.5 K/uL   RBC 4.52 3.87 - 5.11 Mil/uL   Hemoglobin 13.3 12.0 - 15.0 g/dL   HCT 16.1 09.6 - 04.5 %   MCV 90.2 78.0 - 100.0 fl   MCHC 32.6 30.0 - 36.0 g/dL   RDW 40.9 81.1 - 91.4 %   Platelets 402.0 (H) 150.0 - 400.0 K/uL   Neutrophils Relative % 68.0 43.0 - 77.0 %   Lymphocytes Relative 20.8 12.0 - 46.0 %   Monocytes Relative 8.1 3.0 - 12.0 %   Eosinophils Relative 2.1 0.0 - 5.0 %   Basophils Relative 1.0 0.0 - 3.0 %   Neutro Abs 6.7 1.4 - 7.7 K/uL   Lymphs Abs 2.1 0.7 - 4.0 K/uL   Monocytes Absolute 0.8 0.1 - 1.0 K/uL   Eosinophils Absolute 0.2 0.0 - 0.7 K/uL   Basophils Absolute 0.1 0.0 - 0.1 K/uL  Comp Met (CMET)  Result Value Ref Range   Sodium 141 135 -  145 mEq/L   Potassium 3.7 3.5 - 5.1 mEq/L   Chloride 105 96 - 112 mEq/L   CO2 29 19 - 32 mEq/L   Glucose, Bld 86 70 - 99 mg/dL   BUN 16 6 - 23 mg/dL   Creatinine, Ser 1.61 0.40 - 1.20 mg/dL   Total Bilirubin 0.7 0.2 - 1.2 mg/dL   Alkaline Phosphatase 70 39 - 117 U/L   AST 42 (H) 0 - 37 U/L   ALT 53 (H) 0 - 35 U/L   Total Protein 6.8 6.0 - 8.3 g/dL   Albumin 4.0 3.5 - 5.2 g/dL   GFR 09.60 >45.40 mL/min   Calcium 9.6 8.4 - 10.5 mg/dL  Lipid panel  Result Value Ref Range   Cholesterol 148 0 - 200 mg/dL   Triglycerides 981.1 0.0 - 149.0 mg/dL   HDL 91.47 (L) >82.95 mg/dL   VLDL 62.1 0.0 - 30.8 mg/dL   LDL Cholesterol 80 0 - 99 mg/dL   Total CHOL/HDL Ratio 4    NonHDL 109.09   TSH  Result Value Ref Range   TSH <0.01 Repeated and verified X2. (L) 0.35 - 5.50 uIU/mL  Urine Microscopic Only  Result Value Ref Range   WBC, UA 0-2/hpf 0-2/hpf   RBC / HPF 0-2/hpf 0-2/hpf   Mucus, UA Presence of (A) None   Squamous Epithelial / HPF Few(5-10/hpf) (A) Rare(0-4/hpf)    Assessment & Plan    Routine Health Maintenance and Physical Exam  Exercise Activities and Dietary recommendations   --balanced diet high in fiber and protein, low in sugars, carbs, fats. --physical activity/exercise 20-30 minutes 3-5 times  a week    Immunization History  Administered Date(s) Administered   Influenza Whole 11/10/2010, 10/09/2012   Influenza,inj,Quad PF,6+ Mos 11/25/2019   Influenza-Unspecified 10/09/2017, 10/24/2018, 11/25/2019, 10/27/2021, 10/23/2022   PNEUMOCOCCAL CONJUGATE-20 01/19/2022   Pneumococcal Conjugate-13 01/17/2013   Tdap 02/08/2011, 01/18/2021   Zoster Recombinant(Shingrix) 11/27/2017, 03/05/2018    Health Maintenance  Topic Date Due   MAMMOGRAM  02/22/2024   Fecal DNA (Cologuard)  02/03/2025   Cervical Cancer Screening (HPV/Pap Cotest)  01/18/2026   DTaP/Tdap/Td (3 - Td or Tdap) 01/19/2031   Pneumococcal Vaccine 55-85 Years old  Completed   INFLUENZA VACCINE  Completed   Hepatitis C Screening  Completed   HIV Screening  Completed   Zoster Vaccines- Shingrix  Completed   HPV VACCINES  Aged Out   COVID-19 Vaccine  Discontinued    Discussed health benefits of physical activity, and encouraged her to engage in regular exercise appropriate for her age and condition.  Problem List Items Addressed This Visit       Cardiovascular and Mediastinum   Hypertension   Elevated in office today She manages with metoprolol 50 mg , valsartan 160, hydrochlorothiazide 12.5 mg, also takes dilt 120 mg  Advised she check at home and f/b with pcp       Relevant Orders   CBC w/Diff (Completed)   Comp Met (CMET) (Completed)   Lipid panel (Completed)   Paroxysmal atrial fibrillation (HCC)   Manages with diltiazem 120 mg daily        Respiratory   Asthma   Stable, manages with prn albuterol and daily pulmicort      Relevant Medications   budesonide (PULMICORT FLEXHALER) 180 MCG/ACT inhaler     Other   Hyperlipidemia   Repeat fasting lipids Not on a statin The 10-year ASCVD risk score (Arnett DK, et al., 2019) is: 9.1%  Relevant Orders   Lipid panel (Completed)   Post-menopausal bleeding   Ordering urine micro r/o hematuria,  Will check tsh, cbc  Ordering pelvic ultrasound       Relevant Orders   CBC w/Diff (Completed)   US PELVIC COMPLETE WITH TRANSVAGINAL   Urine Microscopic Only (Completed)   Other Visit Diagnoses       Annual physical exam    -  Primary   Relevant Orders   CBC w/Diff (Completed)   Comp Met (CMET) (Completed)   Lipid panel (Completed)   TSH (Completed)     Low TSH level       Relevant Orders   T4, free (Completed)   Thyroid stimulating immunoglobulin   Thyrotropin receptor autoabs     Hyperthyroidism       Relevant Orders   T4, free (Completed)   Thyroid stimulating immunoglobulin   Thyrotropin receptor autoabs     Elevated liver enzymes       Relevant Orders   Comp Met (CMET) (Completed)      On labs-- low tsh, was seen, ordering graves disease ab and free t4.     Return in about 4 weeks (around 02/22/2023) for hypertension.     Alfredia Ferguson, PA-C  [facility] [phone] (phone) [phone] (fax)  [facility]

## 2023-01-25 ENCOUNTER — Ambulatory Visit (INDEPENDENT_AMBULATORY_CARE_PROVIDER_SITE_OTHER): Payer: 59 | Admitting: Physician Assistant

## 2023-01-25 ENCOUNTER — Encounter: Payer: 59 | Admitting: Family Medicine

## 2023-01-25 VITALS — BP 158/98 | HR 94 | Temp 97.7°F | Ht 69.0 in | Wt 251.1 lb

## 2023-01-25 DIAGNOSIS — R748 Abnormal levels of other serum enzymes: Secondary | ICD-10-CM

## 2023-01-25 DIAGNOSIS — J45909 Unspecified asthma, uncomplicated: Secondary | ICD-10-CM

## 2023-01-25 DIAGNOSIS — Z0000 Encounter for general adult medical examination without abnormal findings: Secondary | ICD-10-CM

## 2023-01-25 DIAGNOSIS — I10 Essential (primary) hypertension: Secondary | ICD-10-CM

## 2023-01-25 DIAGNOSIS — E785 Hyperlipidemia, unspecified: Secondary | ICD-10-CM

## 2023-01-25 DIAGNOSIS — R7989 Other specified abnormal findings of blood chemistry: Secondary | ICD-10-CM

## 2023-01-25 DIAGNOSIS — N950 Postmenopausal bleeding: Secondary | ICD-10-CM | POA: Diagnosis not present

## 2023-01-25 DIAGNOSIS — I480 Paroxysmal atrial fibrillation: Secondary | ICD-10-CM

## 2023-01-25 DIAGNOSIS — E0590 Thyrotoxicosis, unspecified without thyrotoxic crisis or storm: Secondary | ICD-10-CM

## 2023-01-25 LAB — COMPREHENSIVE METABOLIC PANEL
ALT: 53 U/L — ABNORMAL HIGH (ref 0–35)
AST: 42 U/L — ABNORMAL HIGH (ref 0–37)
Albumin: 4 g/dL (ref 3.5–5.2)
Alkaline Phosphatase: 70 U/L (ref 39–117)
BUN: 16 mg/dL (ref 6–23)
CO2: 29 meq/L (ref 19–32)
Calcium: 9.6 mg/dL (ref 8.4–10.5)
Chloride: 105 meq/L (ref 96–112)
Creatinine, Ser: 0.51 mg/dL (ref 0.40–1.20)
GFR: 99.1 mL/min (ref >60.00)
Glucose, Bld: 86 mg/dL (ref 70–99)
Potassium: 3.7 meq/L (ref 3.5–5.1)
Sodium: 141 meq/L (ref 135–145)
Total Bilirubin: 0.7 mg/dL (ref 0.2–1.2)
Total Protein: 6.8 g/dL (ref 6.0–8.3)

## 2023-01-25 LAB — CBC WITH DIFFERENTIAL/PLATELET
Basophils Absolute: 0.1 10*3/uL (ref 0.0–0.1)
Basophils Relative: 1 % (ref 0.0–3.0)
Eosinophils Absolute: 0.2 10*3/uL (ref 0.0–0.7)
Eosinophils Relative: 2.1 % (ref 0.0–5.0)
HCT: 40.8 % (ref 36.0–46.0)
Hemoglobin: 13.3 g/dL (ref 12.0–15.0)
Lymphocytes Relative: 20.8 % (ref 12.0–46.0)
Lymphs Abs: 2.1 10*3/uL (ref 0.7–4.0)
MCHC: 32.6 g/dL (ref 30.0–36.0)
MCV: 90.2 fL (ref 78.0–100.0)
Monocytes Absolute: 0.8 10*3/uL (ref 0.1–1.0)
Monocytes Relative: 8.1 % (ref 3.0–12.0)
Neutro Abs: 6.7 10*3/uL (ref 1.4–7.7)
Neutrophils Relative %: 68 % (ref 43.0–77.0)
Platelets: 402 10*3/uL — ABNORMAL HIGH (ref 150.0–400.0)
RBC: 4.52 Mil/uL (ref 3.87–5.11)
RDW: 13.8 % (ref 11.5–15.5)
WBC: 9.9 10*3/uL (ref 4.0–10.5)

## 2023-01-25 LAB — LIPID PANEL
Cholesterol: 148 mg/dL (ref 0–200)
HDL: 38.6 mg/dL — ABNORMAL LOW (ref >39.00)
LDL Cholesterol: 80 mg/dL (ref 0–99)
NonHDL: 109.09
Total CHOL/HDL Ratio: 4
Triglycerides: 144 mg/dL (ref 0.0–149.0)
VLDL: 28.8 mg/dL (ref 0.0–40.0)

## 2023-01-25 LAB — URINALYSIS, MICROSCOPIC ONLY

## 2023-01-25 LAB — TSH: TSH: <0.01 u[IU]/mL — ABNORMAL LOW (ref 0.35–5.50)

## 2023-01-25 MED ORDER — PULMICORT FLEXHALER 180 MCG/ACT IN AEPB: 2.0000 | INHALATION_SPRAY | Freq: Two times a day (BID) | RESPIRATORY_TRACT | 5 refills | Status: AC

## 2023-01-26 ENCOUNTER — Ambulatory Visit (INDEPENDENT_AMBULATORY_CARE_PROVIDER_SITE_OTHER): Payer: 59

## 2023-01-26 ENCOUNTER — Encounter: Payer: Self-pay | Admitting: Physician Assistant

## 2023-01-26 DIAGNOSIS — R7989 Other specified abnormal findings of blood chemistry: Secondary | ICD-10-CM

## 2023-01-26 DIAGNOSIS — R748 Abnormal levels of other serum enzymes: Secondary | ICD-10-CM

## 2023-01-26 DIAGNOSIS — E0590 Thyrotoxicosis, unspecified without thyrotoxic crisis or storm: Secondary | ICD-10-CM | POA: Diagnosis not present

## 2023-01-26 DIAGNOSIS — N950 Postmenopausal bleeding: Secondary | ICD-10-CM | POA: Insufficient documentation

## 2023-01-26 LAB — COMPREHENSIVE METABOLIC PANEL
ALT: 52 U/L — ABNORMAL HIGH (ref 0–35)
AST: 45 U/L — ABNORMAL HIGH (ref 0–37)
Albumin: 4 g/dL (ref 3.5–5.2)
Alkaline Phosphatase: 74 U/L (ref 39–117)
BUN: 17 mg/dL (ref 6–23)
CO2: 25 meq/L (ref 19–32)
Calcium: 9.7 mg/dL (ref 8.4–10.5)
Chloride: 105 meq/L (ref 96–112)
Creatinine, Ser: 0.54 mg/dL (ref 0.40–1.20)
GFR: 97.74 mL/min (ref >60.00)
Glucose, Bld: 86 mg/dL (ref 70–99)
Potassium: 3.8 meq/L (ref 3.5–5.1)
Sodium: 143 meq/L (ref 135–145)
Total Bilirubin: 0.5 mg/dL (ref 0.2–1.2)
Total Protein: 7.1 g/dL (ref 6.0–8.3)

## 2023-01-26 LAB — T4, FREE: Free T4: 2.3 ng/dL — ABNORMAL HIGH (ref 0.60–1.60)

## 2023-01-26 NOTE — Assessment & Plan Note (Signed)
Ordering urine micro r/o hematuria,  Will check tsh, cbc  Ordering pelvic ultrasound

## 2023-01-26 NOTE — Assessment & Plan Note (Signed)
Repeat fasting lipids Not on a statin The 10-year ASCVD risk score (Arnett DK, et al., 2019) is: 9.1%

## 2023-01-26 NOTE — Assessment & Plan Note (Signed)
Manages with diltiazem 120 mg daily

## 2023-01-26 NOTE — Assessment & Plan Note (Signed)
Elevated in office today She manages with metoprolol 50 mg , valsartan 160, hydrochlorothiazide 12.5 mg, also takes dilt 120 mg  Advised she check at home and f/b with pcp

## 2023-01-26 NOTE — Assessment & Plan Note (Signed)
Stable, manages with prn albuterol and daily pulmicort

## 2023-01-28 LAB — THYROTROPIN RECEPTOR AUTOABS: Thyrotropin Receptor Ab: 10.7 [IU]/L — ABNORMAL HIGH (ref 0.00–1.75)

## 2023-01-29 ENCOUNTER — Encounter: Payer: Self-pay | Admitting: Family Medicine

## 2023-01-29 ENCOUNTER — Other Ambulatory Visit: Payer: Self-pay | Admitting: Family Medicine

## 2023-01-30 ENCOUNTER — Telehealth: Payer: Self-pay | Admitting: Physician Assistant

## 2023-01-30 ENCOUNTER — Other Ambulatory Visit: Payer: Self-pay | Admitting: Physician Assistant

## 2023-01-30 DIAGNOSIS — E0590 Thyrotoxicosis, unspecified without thyrotoxic crisis or storm: Secondary | ICD-10-CM

## 2023-01-30 MED ORDER — METHIMAZOLE 5 MG PO TABS: 5.0000 mg | ORAL_TABLET | Freq: Three times a day (TID) | ORAL | 3 refills | Status: DC

## 2023-01-30 NOTE — Telephone Encounter (Signed)
Please let Pearly know-- spoke w/ Dr Abner Greenspan, and recommending to start a hyperthyroid medicine, methimazole, that you take three times a day.  We are also referring to endocrinology to see if medication is the best option or if there are other recommendations!

## 2023-01-30 NOTE — Telephone Encounter (Signed)
Called and spoke with patient, verbalized understanding.

## 2023-01-31 LAB — THYROID STIMULATING IMMUNOGLOBULIN: TSI: 375 % baseline — ABNORMAL HIGH (ref <140)

## 2023-02-08 ENCOUNTER — Ambulatory Visit (HOSPITAL_BASED_OUTPATIENT_CLINIC_OR_DEPARTMENT_OTHER)
Admission: RE | Admit: 2023-02-08 | Discharge: 2023-02-08 | Disposition: A | Discharge status: Home / Self Care | Payer: 59 | Source: Ambulatory Visit | Attending: Physician Assistant | Admitting: Physician Assistant

## 2023-02-08 ENCOUNTER — Encounter: Payer: Self-pay | Admitting: Physician Assistant

## 2023-02-08 ENCOUNTER — Other Ambulatory Visit: Payer: Self-pay | Admitting: Physician Assistant

## 2023-02-08 ENCOUNTER — Ambulatory Visit (HOSPITAL_BASED_OUTPATIENT_CLINIC_OR_DEPARTMENT_OTHER)
Admission: RE | Admit: 2023-02-08 | Discharge: 2023-02-08 | Disposition: A | Discharge status: Home / Self Care | Payer: 59 | Source: Ambulatory Visit | Attending: Family Medicine | Admitting: Family Medicine

## 2023-02-08 ENCOUNTER — Encounter (HOSPITAL_BASED_OUTPATIENT_CLINIC_OR_DEPARTMENT_OTHER): Payer: Self-pay

## 2023-02-08 DIAGNOSIS — Z1231 Encounter for screening mammogram for malignant neoplasm of breast: Secondary | ICD-10-CM | POA: Diagnosis present

## 2023-02-08 DIAGNOSIS — N950 Postmenopausal bleeding: Secondary | ICD-10-CM | POA: Diagnosis present

## 2023-02-08 DIAGNOSIS — D251 Intramural leiomyoma of uterus: Secondary | ICD-10-CM

## 2023-02-10 DIAGNOSIS — E0500 Thyrotoxicosis with diffuse goiter without thyrotoxic crisis or storm: Secondary | ICD-10-CM

## 2023-02-10 HISTORY — DX: Thyrotoxicosis with diffuse goiter without thyrotoxic crisis or storm: E05.00

## 2023-02-13 NOTE — Addendum Note (Signed)
Addended byAlfredia Ferguson on: 02/13/2023 12:56 PM   Modules accepted: Orders

## 2023-02-26 ENCOUNTER — Ambulatory Visit (HOSPITAL_BASED_OUTPATIENT_CLINIC_OR_DEPARTMENT_OTHER): Payer: 59

## 2023-02-26 ENCOUNTER — Ambulatory Visit (HOSPITAL_BASED_OUTPATIENT_CLINIC_OR_DEPARTMENT_OTHER)
Admission: RE | Admit: 2023-02-26 | Discharge: 2023-02-26 | Disposition: A | Discharge status: Home / Self Care | Payer: 59 | Source: Ambulatory Visit | Attending: Family Medicine | Admitting: Family Medicine

## 2023-02-26 DIAGNOSIS — Z1231 Encounter for screening mammogram for malignant neoplasm of breast: Secondary | ICD-10-CM | POA: Diagnosis present

## 2023-03-09 ENCOUNTER — Other Ambulatory Visit: Payer: Self-pay

## 2023-03-09 DIAGNOSIS — E0590 Thyrotoxicosis, unspecified without thyrotoxic crisis or storm: Secondary | ICD-10-CM

## 2023-03-16 ENCOUNTER — Other Ambulatory Visit: Payer: 59

## 2023-03-20 LAB — T3, FREE: T3, Free: 3.3 pg/mL (ref 2.3–4.2)

## 2023-03-20 LAB — TSH: TSH: 0.02 m[IU]/L — ABNORMAL LOW (ref 0.40–4.50)

## 2023-03-20 LAB — TRAB (TSH RECEPTOR BINDING ANTIBODY): TRAB: 6.17 IU/L — ABNORMAL HIGH (ref <=2.00)

## 2023-03-20 LAB — THYROID STIMULATING IMMUNOGLOBULIN: TSI: 389 %{baseline} — ABNORMAL HIGH (ref <140)

## 2023-03-20 LAB — T4, FREE: Free T4: 1.2 ng/dL (ref 0.8–1.8)

## 2023-03-22 ENCOUNTER — Ambulatory Visit: Payer: 59 | Admitting: "Endocrinology

## 2023-03-22 ENCOUNTER — Encounter: Payer: Self-pay | Admitting: "Endocrinology

## 2023-03-22 VITALS — BP 140/80 | HR 85 | Ht 69.0 in | Wt 250.0 lb

## 2023-03-22 DIAGNOSIS — E0590 Thyrotoxicosis, unspecified without thyrotoxic crisis or storm: Secondary | ICD-10-CM | POA: Diagnosis not present

## 2023-03-22 DIAGNOSIS — E0500 Thyrotoxicosis with diffuse goiter without thyrotoxic crisis or storm: Secondary | ICD-10-CM

## 2023-03-22 MED ORDER — METHIMAZOLE 10 MG PO TABS: 10.0000 mg | ORAL_TABLET | Freq: Every day | ORAL | Status: DC

## 2023-03-22 NOTE — Patient Instructions (Signed)
  If you notice any symptoms of worsening fatigue, fever with sore throat, loss of appetite, yellowing of eyes, dark urine, joint pains, sores in the mouth, itchy rash, light colored stools or abdominal pain, please stop the medication and call us immediately as this can be a serious side effect of the medication.

## 2023-03-22 NOTE — Progress Notes (Signed)
 Outpatient Endocrinology Note [place], MD  03/22/23   Kathleen Church 1960/07/26 [id number]  Referring Provider: Alfredia Ferguson, PA-C Primary Care Provider: Bradd Canary, MD Subjective  No chief complaint on file.   Assessment & Plan  Diagnoses and all orders for this visit:  Graves disease -     T4, free -     T3, free -     TSH  Hyperthyroidism -     methimazole (TAPAZOLE) 10 MG tablet; Take 1 tablet (10 mg total) by mouth daily.   CARALINE Church is currently taking methimazole 5 mg tid. Patient currently clinically and biochemically hypothyroid.  Discussed the etiology for hyperthyroidism. Educated on thyroid axis.  Recommend the following: Take methimazole 10 mg once a day. Repeat labs in 3 months or sooner if symptoms of hyper or hypothyroidism develop.  Educated on definitive options of treatment including RAI therapy and surgery. Patient does not want RAI at this time.  Counseled on: -complications of untreated hyperthyroidism including atrial fibrillation, heart failure and osteoporosis -side effects of Methimazole including but not limited to allergic reaction, rash, bone marrow suppression, liver dysfunction and teratogenic potential -implications in pregnancy and breastfeeding -compliance and follow up needs    If you notice any symptoms of worsening fatigue, fever with sore throat, loss of appetite, yellowing of eyes, dark urine, joint pains, sores in the mouth, itchy rash, light colored stools or abdominal pain, please stop the medication and call us immediately as this can be a serious side effect of the medication.  I have reviewed current medications, nurse's notes, allergies, vital signs, past medical and surgical history, family medical history, and social history for this encounter. Counseled patient on symptoms, examination findings, lab findings, imaging results, treatment decisions and monitoring and prognosis. The patient understood  the recommendations and agrees with the treatment plan. All questions regarding treatment plan were fully answered.   Return in about 6 weeks (around 05/03/2023).   Kathleen [place], MD  03/22/23   I have reviewed current medications, nurse's notes, allergies, vital signs, past medical and surgical history, family medical history, and social history for this encounter. Counseled patient on symptoms, examination findings, lab findings, imaging results, treatment decisions and monitoring and prognosis. The patient understood the recommendations and agrees with the treatment plan. All questions regarding treatment plan were fully answered.   History of Present Illness Kathleen Church is a 63 y.o. year old female who presents to our clinic with Grave's diagnosed in 2025.    Symptoms suggestive of HYPOTHYROIDISM:  fatigue Yes weight gain Yes cold intolerance  Yes constipation  No  Symptoms suggestive of HYPERTHYROIDISM:  weight loss  No heat intolerance No hyperdefecation  No palpitations  Yes  Compressive symptoms:  dysphagia  Yes, sometimes dysphonia  No positional dyspnea (especially with simultaneous arms elevation)  No  Smokes  No On biotin  No Personal history of head/neck surgery/irradiation  No  Adverse Drug Effects from Methimazole (MMI): rash No fever No throat pain No arthritis No mouth ulcers No jaundice No loss of appetite No lymphadenopathy No  Grave's Ophthalmopathy Clinical Activity Score: 0/9  Physical Exam  BP (!) 140/80   Pulse 85   Ht 5\' 9"  (1.753 m)   Wt 250 lb (113.4 kg)   SpO2 98%   BMI 36.92 kg/m  Constitutional: well developed, well nourished Head: normocephalic, atraumatic, no exophthalmos Eyes: sclera anicteric, no redness Neck: no thyromegaly, no thyroid tenderness; no nodules palpated Lungs:  normal respiratory effort Neurology: alert and oriented, no fine hand tremor Skin: dry, no appreciable rashes Musculoskeletal: no appreciable  defects Psychiatric: normal mood and affect  Allergies Allergies  Allergen Reactions   Codeine Palpitations and Rash    Current Medications Patient's Medications  New Prescriptions   No medications on file  Previous Medications   ALBUTEROL (VENTOLIN HFA) 108 (90 BASE) MCG/ACT INHALER    TAKE 2 PUFFS BY MOUTH EVERY 6 HOURS AS NEEDED FOR WHEEZE OR SHORTNESS OF BREATH   BUDESONIDE (PULMICORT FLEXHALER) 180 MCG/ACT INHALER    Inhale 2 puffs into the lungs in the morning and at bedtime.   DILTIAZEM (TIAZAC) 120 MG 24 HR CAPSULE    Take by mouth.   FENOFIBRATE (TRICOR) 145 MG TABLET    Take 1 tablet (145 mg total) by mouth daily.   METOPROLOL SUCCINATE (TOPROL-XL) 50 MG 24 HR TABLET    TAKE 1 TABLET BY MOUTH DAILY. TAKE WITH OR IMMEDIATELY FOLLOWING A MEAL.   MONTELUKAST (SINGULAIR) 10 MG TABLET    Take 1 tablet (10 mg total) by mouth at bedtime.   RIVAROXABAN (XARELTO) 20 MG TABS TABLET    Take by mouth.   TIZANIDINE (ZANAFLEX) 2 MG TABLET    Take 0.5-2 tablets (1-4 mg total) by mouth every 8 (eight) hours as needed for muscle spasms.   VALSARTAN-HYDROCHLOROTHIAZIDE (DIOVAN-HCT) 160-12.5 MG TABLET    TAKE 1 TABLET BY MOUTH EVERY DAY  Modified Medications   Modified Medication Previous Medication   METHIMAZOLE (TAPAZOLE) 10 MG TABLET methimazole (TAPAZOLE) 5 MG tablet      Take 1 tablet (10 mg total) by mouth daily.    Take 1 tablet (5 mg total) by mouth 3 (three) times daily.  Discontinued Medications   No medications on file    Past Medical History Past Medical History:  Diagnosis Date   Allergic state    Anxiety    when father passed   Arthritis    Asthma, mild persistent    allergy induced asthma    Cervical cancer screening 04/27/2011   Edema 04/27/2011   Heart murmur    as an infant    Hyperlipidemia 04/27/2011   Hypertension 2010   Hypokalemia 04/27/2011   Measles as a child   Mumps as a child   Muscle cramp 10/14/2015   Obesity    Osteoarthritis of left knee 10/14/2015    Plantar fasciitis, left 01/19/2013   Preventative health care 02/13/2011   Sinusitis 02/13/2011    Past Surgical History Past Surgical History:  Procedure Laterality Date   ENDOMETRIAL ABLATION  2006   menorraghia   KNEE SURGERY  2006   left knee   TONSILLECTOMY     TOTAL KNEE ARTHROPLASTY Left 10/15/2015   Procedure: LEFT TOTAL KNEE ARTHROPLASTY;  Surgeon: Eugenia Mcalpine, MD;  Location: [facility];  Service: Orthopedics;  Laterality: Left;   TUBAL LIGATION      Family History family history includes Asthma in her daughter and daughter; Breast cancer in her paternal grandmother; Cancer in her father, maternal grandmother, and paternal grandmother; Cancer (age of onset: 26) in her mother; Factor V Leiden deficiency in her maternal aunt and maternal grandmother; Heart attack in her paternal grandfather; Stroke in her mother.  Social History Social History   Socioeconomic History   Marital status: Widowed    Spouse name: Not on file   Number of children: Not on file   Years of education: Not on file   Highest education level: Not  on file  Occupational History   Not on file  Tobacco Use   Smoking status: Never   Smokeless tobacco: Never  Substance and Sexual Activity   Alcohol use: Yes    Comment: occas    Drug use: No   Sexual activity: Yes    Partners: Male  Other Topics Concern   Not on file  Social History Narrative   Not on file   Social Drivers of Health   Financial Resource Strain: Low Risk  (09/04/2019)   Received from [facility]   Overall Financial Resource Strain (CARDIA)    Difficulty of Paying Living Expenses: Not very hard  Food Insecurity: No Food Insecurity (01/13/2021)   Received [facility], [facility]   Hunger Vital Sign    Worried About Running Out of Food in the Last Year: Never true    Ran Out of Food in the Last Year: Never true  Transportation Needs: Not on file  Physical Activity: Not on file  Stress: No Stress Concern  Present (10/08/2019)   Received from [facility], [facility] of Occupational Health - Occupational Stress Questionnaire    Feeling of Stress : Only a little  Social Connections: Unknown (05/22/2021)   Received from [facility], [facility]   Social Network    Social Network: Not on file  Intimate Partner Violence: Unknown (04/13/2021)   Received from [facility], [facility]   HITS    Physically Hurt: Not on file    Insult or Talk Down To: Not on file    Threaten Physical Harm: Not on file    Scream or Curse: Not on file    Laboratory Investigations Lab Results  Component Value Date   TSH 0.02 (L) 03/16/2023   TSH <0.01 Repeated and verified X2. (L) 01/25/2023   TSH 1.50 01/19/2022   FREET4 1.2 03/16/2023   FREET4 2.30 (H) 01/26/2023     Lab Results  Component Value Date   TSI 389 (H) 03/16/2023     No components found for: "TRAB"   Lab Results  Component Value Date   CHOL 148 01/25/2023   Lab Results  Component Value Date   HDL 38.60 (L) 01/25/2023   Lab Results  Component Value Date   LDLCALC 80 01/25/2023   Lab Results  Component Value Date   TRIG 144.0 01/25/2023   Lab Results  Component Value Date   CHOLHDL 4 01/25/2023   Lab Results  Component Value Date   CREATININE 0.54 01/26/2023   Lab Results  Component Value Date   GFR 97.74 01/26/2023      Component Value Date/Time   NA 143 01/26/2023 0816   K 3.8 01/26/2023 0816   CL 105 01/26/2023 0816   CO2 25 01/26/2023 0816   GLUCOSE 86 01/26/2023 0816   BUN 17 01/26/2023 0816   CREATININE 0.54 01/26/2023 0816   CREATININE 0.57 09/24/2019 1424   CALCIUM 9.7 01/26/2023 0816   PROT 7.1 01/26/2023 0816   ALBUMIN 4.0 01/26/2023 0816   AST 45 (H) 01/26/2023 0816   ALT 52 (H) 01/26/2023 0816   ALKPHOS 74 01/26/2023 0816   BILITOT 0.5 01/26/2023 0816   GFRNONAA >60 10/16/2015 0439   GFRAA >60 10/16/2015 0439      Latest Ref Rng & Units 01/26/2023    8:16 AM  01/25/2023    9:59 AM 01/19/2022    9:25 AM  BMP  Glucose 70 - 99 mg/dL 86  86  92   BUN 6 - 23 mg/dL 17  16  17    Creatinine 0.40 - 1.20 mg/dL 1.09  6.04  5.40   Sodium 135 - 145 mEq/L 143  141  144   Potassium 3.5 - 5.1 mEq/L 3.8  3.7  3.7   Chloride 96 - 112 mEq/L 105  105  105   CO2 19 - 32 mEq/L 25  29  30    Calcium 8.4 - 10.5 mg/dL 9.7  9.6  9.5        Component Value Date/Time   WBC 9.9 01/25/2023 0959   RBC 4.52 01/25/2023 0959   HGB 13.3 01/25/2023 0959   HCT 40.8 01/25/2023 0959   PLT 402.0 (H) 01/25/2023 0959   MCV 90.2 01/25/2023 0959   MCH 31.4 10/21/2019 1147   MCHC 32.6 01/25/2023 0959   RDW 13.8 01/25/2023 0959   LYMPHSABS 2.1 01/25/2023 0959   MONOABS 0.8 01/25/2023 0959   EOSABS 0.2 01/25/2023 0959   BASOSABS 0.1 01/25/2023 0959      Parts of this note may have been dictated using voice recognition software. There may be variances in spelling and vocabulary which are unintentional. Not all errors are proofread. Please notify the Thereasa Parkin if any discrepancies are noted or if the meaning of any statement is not clear.

## 2023-04-05 NOTE — Progress Notes (Signed)
 GYNECOLOGY  VISIT   HPI: 63 y.o.   Widowed  Caucasian female   G2P2002 with No LMP recorded. Patient has had an ablation.   here for: ACUTE NEW GYN// post menopausal bleeding . Patient complains of bleeding/spotting off and on since October. Not currently bleeding. Patient states she was Recently diagnosed with Graves disease in Feb 2025.  On Xarelto.    She has frequent bleeding.  Wears a thin panty liner.  No pain.   Not sexually active.   Study Result  Narrative & Impression  : PROCEDURE: US PELVIS COMPLETE WITH TRANSVAGINAL   HISTORY: Patient is a 63 y/o  F with post menopausal bleeding.   COMPARISON: None available.   TECHNIQUE: Two-dimensional transabdominal grayscale and color Doppler ultrasound of the pelvis was performed. Transvaginal was also performed.   FINDINGS: The uterus is anteverted in position and measures 6.5 x 4.4 x 3.7 cm. It demonstrates a heterogeneous echotexture with an intramural fibroid measuring 1.9 x 1.6 cm within the anterior myometrium. The endometrium measures 0.5 cm and demonstrates a normal homogeneous echotexture. Multiple nabothian cysts are present.   The ovaries are not visualized.   There is no fluid present within the cul-de-sac.   IMPRESSION: 1. Heterogeneous uterine echotexture with an anterior, intramural fibroid measuring 1.9 cm. The endometrium appears unremarkable.   2.  Nonvisualization of the ovaries.   Thank you for allowing [place] to assist in the care of this patient.     Electronically Signed   By: Lestine Box M.D.   On: 02/08/2023 15:47      4 grandchildren.   GYNECOLOGIC HISTORY: No LMP recorded. Patient has had an ablation. Contraception:  ablation Menopausal hormone therapy:  n/a Last 2 paps:  01/18/21 neg: HR HPV neg, 11/27/17 neg: HR HPV neg History of abnormal Pap or positive HPV:  no Mammogram:  02/26/23 Breast Density Cat B, BI-RADS CAT 1 neg        OB History     Gravida  2   Para  2    Term  2   Preterm      AB      Living  2      SAB      IAB      Ectopic      Multiple      Live Births  2              Patient Active Problem List   Diagnosis Date Noted   Post-menopausal bleeding 01/26/2023   Neck pain 01/19/2022   Skin lesion of right leg 01/18/2021   Thrombocytosis 12/09/2019   Hyperglycemia 11/26/2019   Paroxysmal atrial fibrillation (HCC) 09/26/2019   COVID-19 09/02/2019   Myalgia 11/16/2016   S/P knee replacement 10/15/2015   Arthritis of right knee 10/14/2015   Muscle cramp 10/14/2015   Tachycardia 06/08/2013   Cervical cancer screening 04/27/2011   Hypokalemia 04/27/2011   Edema 04/27/2011   Hyperlipidemia 04/27/2011   Asthma 02/27/2011   Preventative health care 02/13/2011   Sinusitis 02/13/2011   Anxiety    Hypertension    Environmental allergies    Obesity     Past Medical History:  Diagnosis Date   Allergic state    Anxiety    when father passed   Arthritis    Asthma, mild persistent    allergy induced asthma    Cervical cancer screening 04/27/2011   Edema 04/27/2011   Heart murmur    as an infant  Hyperlipidemia 04/27/2011   Hypertension 2010   Hypokalemia 04/27/2011   Measles as a child   Mumps as a child   Muscle cramp 10/14/2015   Obesity    Osteoarthritis of left knee 10/14/2015   Plantar fasciitis, left 01/19/2013   Preventative health care 02/13/2011   Sinusitis 02/13/2011    Past Surgical History:  Procedure Laterality Date   ENDOMETRIAL ABLATION  2006   menorraghia   KNEE SURGERY  2006   left knee   TONSILLECTOMY     TOTAL KNEE ARTHROPLASTY Left 10/15/2015   Procedure: LEFT TOTAL KNEE ARTHROPLASTY;  Surgeon: Eugenia Mcalpine, MD;  Location: [facility];  Service: Orthopedics;  Laterality: Left;   TUBAL LIGATION      Current Outpatient Medications  Medication Sig Dispense Refill   albuterol (VENTOLIN HFA) 108 (90 Base) MCG/ACT inhaler TAKE 2 PUFFS BY MOUTH EVERY 6 HOURS AS NEEDED FOR WHEEZE OR SHORTNESS OF  BREATH 18 each 3   budesonide (PULMICORT FLEXHALER) 180 MCG/ACT inhaler Inhale 2 puffs into the lungs in the morning and at bedtime. 1 each 5   fenofibrate (TRICOR) 145 MG tablet Take 1 tablet (145 mg total) by mouth daily. 90 tablet 1   methimazole (TAPAZOLE) 10 MG tablet Take 1 tablet (10 mg total) by mouth daily.     metoprolol succinate (TOPROL-XL) 50 MG 24 hr tablet TAKE 1 TABLET BY MOUTH DAILY. TAKE WITH OR IMMEDIATELY FOLLOWING A MEAL. 90 tablet 1   montelukast (SINGULAIR) 10 MG tablet Take 1 tablet (10 mg total) by mouth at bedtime. 90 tablet 1   rivaroxaban (XARELTO) 20 MG TABS tablet Take by mouth.     valsartan-hydrochlorothiazide (DIOVAN-HCT) 160-12.5 MG tablet TAKE 1 TABLET BY MOUTH EVERY DAY 90 tablet 1   diltiazem (TIAZAC) 120 MG 24 hr capsule Take by mouth. (Patient not taking: Reported on 04/19/2023)     tiZANidine (ZANAFLEX) 2 MG tablet Take 0.5-2 tablets (1-4 mg total) by mouth every 8 (eight) hours as needed for muscle spasms. (Patient not taking: Reported on 04/19/2023) 30 tablet 0   No current facility-administered medications for this visit.     ALLERGIES: Codeine  Family History  Problem Relation Age of Onset   Cancer Father        kidney   Cancer Maternal Grandmother        bone   Factor V Leiden deficiency Maternal Grandmother    Cancer Paternal Grandmother        breast   Breast cancer Paternal Grandmother    Asthma Daughter    Heart attack Paternal Grandfather    Cancer Mother 33       metastatic lung cancer   Stroke Mother    Asthma Daughter    Factor V Leiden deficiency Maternal Aunt     Social History   Socioeconomic History   Marital status: Widowed    Spouse name: Not on file   Number of children: Not on file   Years of education: Not on file   Highest education level: Not on file  Occupational History   Not on file  Tobacco Use   Smoking status: Never   Smokeless tobacco: Never  Substance and Sexual Activity   Alcohol use: Yes     Comment: occas    Drug use: No   Sexual activity: Not Currently    Partners: Male  Other Topics Concern   Not on file  Social History Narrative   Not on file   Social Drivers of Health  Financial Resource Strain: Low Risk  (09/04/2019)   Received [facility], [facility]   Overall Financial Resource Strain (CARDIA)    Difficulty of Paying Living Expenses: Not very hard  Food Insecurity: No Food Insecurity (01/13/2021)   Received from [facility], [facility]   Hunger Vital Sign    Worried About Running Out of Food in the Last Year: Never true    Ran Out of Food in the Last Year: Never true  Transportation Needs: Not on file  Physical Activity: Not on file  Stress: No Stress Concern Present (10/08/2019)   Received from [facility], [facility] of Occupational Health - Occupational Stress Questionnaire    Feeling of Stress : Only a little  Social Connections: Unknown (05/22/2021)   Received from [facility], [facility]   Social Network    Social Network: Not on file  Intimate Partner Violence: Unknown (04/13/2021)   Received from [facility], [facility]   HITS    Physically Hurt: Not on file    Insult or Talk Down To: Not on file    Threaten Physical Harm: Not on file    Scream or Curse: Not on file    Review of Systems  All other systems reviewed and are negative.   PHYSICAL EXAMINATION:   BP 116/82 (BP Location: Left Arm, Patient Position: Sitting, Cuff Size: Normal)   Pulse 63   Ht 5' 6.5" (1.689 m)   Wt 254 lb (115.2 kg)   SpO2 93%   BMI 40.38 kg/m     General appearance: alert, cooperative and appears stated age  Abdomen: soft, non-tender, no masses,  no organomegaly  Pelvic: External genitalia:  no lesions              Urethra:  normal appearing urethra with no masses, tenderness or lesions              Bartholins and Skenes: normal                 Vagina: normal appearing vagina with normal color and discharge, no  lesions              Cervix: no lesions                Bimanual Exam:  Uterus:  normal size, contour, position, consistency, mobility, non-tender              Adnexa: no mass, fullness, tenderness              Rectal exam: Yes.  .  Confirms.              Anus:  normal sphincter tone, no lesions  Endometrial biopsy; Consent for procedure. Time out done. Sterile prep with  Hibiclens Tenaculum to anterior cervical lip. Pipelle passed to     8     cm twice.   Tissue to pathology.  Minimal EBL. No complications.   Chaperone was present for exam:  Warren Lacy, CMA  ASSESSMENT:  Postmenopausal bleeding.  Recurrent.  Cervical cancer screening.  Hx endometrial ablation.  On Xarelto.    PLAN:  Postmenopausal bleeding discussed including etiologies of polyps, infection, precancer, and cancer.  Pelvic US findings reviewed.  Pap and HR HPV collected.  FU EMB.  Bleeding and infection precautions given.  Final plan to follow.  We discussed potential for treatment, and that this would be based on the test results obtained.   30 min  total time was spent for this patient encounter, including preparation, face-to-face counseling with the patient, coordination of care, and documentation of the encounter in addition to doing the endometrial biopsy today.

## 2023-04-19 ENCOUNTER — Other Ambulatory Visit (HOSPITAL_COMMUNITY)
Admission: RE | Admit: 2023-04-19 | Discharge: 2023-04-19 | Disposition: A | Discharge status: Home / Self Care | Source: Ambulatory Visit | Attending: Obstetrics and Gynecology | Admitting: Obstetrics and Gynecology

## 2023-04-19 ENCOUNTER — Encounter: Payer: Self-pay | Admitting: Obstetrics and Gynecology

## 2023-04-19 ENCOUNTER — Ambulatory Visit: Payer: 59 | Admitting: Obstetrics and Gynecology

## 2023-04-19 VITALS — BP 116/82 | HR 63 | Ht 66.5 in | Wt 254.0 lb

## 2023-04-19 DIAGNOSIS — N950 Postmenopausal bleeding: Secondary | ICD-10-CM

## 2023-04-19 DIAGNOSIS — Z124 Encounter for screening for malignant neoplasm of cervix: Secondary | ICD-10-CM | POA: Diagnosis not present

## 2023-04-19 NOTE — Patient Instructions (Signed)
 Uterine Bleeding After Menopause: What It Means Menopause is the end of a female's fertile years. After menopause, your ovaries can no longer release an egg. You'll no longer be able to get pregnant, and you'll no longer get a period. Any type of bleeding after menopause should be checked by a health care provider, as this is not normal. Treatment will depend on the cause of the bleeding. What can cause bleeding after menopause? Your provider may do tests to find out why you're bleeding. You may have bleeding if: You take hormones to treat the symptoms of menopause. The lining of your uterus thins as a result of low estrogen. The lining of your uterus thickens as a result of too much estrogen. You have cancer in the uterus. You have growths in the uterus, such as: Polyps. Fibroids. Follow these instructions at home: Pay attention to any changes in your symptoms. Let your provider know about them. If told by your provider: Avoid using tampons and douches. Get regular pelvic exams, including Pap tests. Take iron supplements. Change your pads regularly. Take your medicines only as told. Contact a health care provider if: You have pain in your belly. You have headaches. You have a fever or chills. You feel faint or dizzy. Get help right away if: You pass large blood clots. You have heavy bleeding that needs more than 1 pad an hour and you've never had this before. This information is not intended to replace advice given to you by your health care provider. Make sure you discuss any questions you have with your health care provider. Document Revised: 11/07/2022 Document Reviewed: 09/04/2022 Elsevier Patient Education  2024 [employer].

## 2023-04-24 ENCOUNTER — Encounter: Payer: Self-pay | Admitting: Obstetrics and Gynecology

## 2023-04-24 ENCOUNTER — Other Ambulatory Visit: Payer: Self-pay

## 2023-04-24 DIAGNOSIS — C541 Malignant neoplasm of endometrium: Secondary | ICD-10-CM

## 2023-04-24 LAB — SURGICAL PATHOLOGY

## 2023-04-25 LAB — CYTOLOGY - PAP
Comment: NEGATIVE
Diagnosis: NEGATIVE
High risk HPV: NEGATIVE

## 2023-04-26 ENCOUNTER — Telehealth: Payer: Self-pay

## 2023-04-26 NOTE — Telephone Encounter (Signed)
 Spoke with Kathleen Church regarding her referral to GYN oncology. She has an appointment scheduled with Dr. Orvil Bland on 05/18/23 at 9:45. Patient agrees to date and time. She has been provided with office address and location. She is also aware of our mask and visitor policy. Patient verbalized understanding and will call with any questions.

## 2023-04-27 ENCOUNTER — Other Ambulatory Visit: Payer: Self-pay | Admitting: Family Medicine

## 2023-04-28 ENCOUNTER — Other Ambulatory Visit: Payer: Self-pay | Admitting: Physician Assistant

## 2023-04-28 DIAGNOSIS — E0590 Thyrotoxicosis, unspecified without thyrotoxic crisis or storm: Secondary | ICD-10-CM

## 2023-05-03 ENCOUNTER — Other Ambulatory Visit

## 2023-05-03 ENCOUNTER — Encounter: Payer: Self-pay | Admitting: Gynecologic Oncology

## 2023-05-03 NOTE — Progress Notes (Unsigned)
 GYNECOLOGIC ONCOLOGY NEW PATIENT CONSULTATION   Patient Name: Kathleen Church  Patient Age: 63 y.o. Date of Service: 05/04/23 Referring Provider: Jonel Nephew, MD  Primary Care Provider: Neda Balk, MD Consulting Provider: Wiley Hanger, MD   Assessment/Plan:  Postmenopausal patient with low-grade endometrioid endometrial adenocarcinoma.  We reviewed the nature of endometrial cancer and its recommended surgical staging, including total hysterectomy, bilateral salpingo-oophorectomy, and lymph node assessment. The patient is a suitable candidate for staging via a minimally invasive approach to surgery.  We reviewed that robotic assistance would be used to complete the surgery.   We discussed that most endometrial cancer is detected early and that decisions regarding adjuvant therapy will be made based on her final pathology.   We reviewed the sentinel lymph node technique. Risks and benefits of sentinel lymph node biopsy was reviewed. We reviewed the technique and ICG dye. The patient DOES NOT have an iodine allergy or known liver dysfunction. We reviewed the false negative rate (0.4%), and that 3% of patients with metastatic disease will not have it detected by SLN biopsy in endometrial cancer. A low risk of allergic reaction to the dye, <0.2% for ICG, has been reported. We also discussed that in the case of failed mapping, which occurs 40% of the time, a bilateral or unilateral lymphadenectomy will be performed at the surgeon's discretion.   Potential benefits of sentinel nodes including a higher detection rate for metastasis due to ultrastaging and potential reduction in operative morbidity. However, there remains uncertainty as to the role for treatment of micrometastatic disease. Further, the benefit of operative morbidity associated with the SLN technique in endometrial cancer is not yet completely known. In other patient populations (e.g. the cervical cancer population) there has been  observed reductions in morbidity with SLN biopsy compared to pelvic lymphadenectomy. Lymphedema, nerve dysfunction and lymphocysts are all potential risks with the SLN technique as with complete lymphadenectomy. Additional risks to the patient include the risk of damage to an internal organ while operating in an altered view (e.g. the black and white image of the robotic fluorescence imaging mode).   We discussed the plan for a robotic assisted hysterectomy, bilateral salpingo-oophorectomy, sentinel lymph node evaluation, possible lymph node dissection, possible laparotomy. The risks of surgery were discussed in detail and she understands these to include infection; wound separation; hernia; vaginal cuff separation, injury to adjacent organs such as bowel, bladder, blood vessels, ureters and nerves; bleeding which may require blood transfusion; anesthesia risk; thromboembolic events; possible death; unforeseen complications; possible need for re-exploration; medical complications such as heart attack, stroke, pleural effusion and pneumonia; and, if full lymphadenectomy is performed the risk of lymphedema and lymphocyst. The patient will receive DVT and antibiotic prophylaxis as indicated. She voiced a clear understanding. She had the opportunity to ask questions. Perioperative instructions were reviewed with her. Prescriptions for post-op medications were sent to her pharmacy of choice.  Given her history of A-fib on Xarelto, we will reach out to cardiology for clearance.  We discussed holding therapeutic dose of Xarelto and restarting prophylactic dose after surgery generally for 5-7 days before increasing back to therapeutic.  We will also reach out to her endocrinologist for any perioperative recommendations in the setting of her Graves disease.  A copy of this note was sent to the patient's referring provider.   70 minutes of total time was spent for this patient encounter, including preparation,  face-to-face counseling with the patient and coordination of care, and documentation of the encounter.  Wiley Hanger, MD  Division of Gynecologic Oncology  Department of Obstetrics and Gynecology  [facility]  ___________________________________________  Chief Complaint: Chief Complaint  Patient presents with   endometrial cancer    History of Present Illness:  Kathleen Church is a 63 y.o. y.o. female who is seen in consultation at the request of Dr. Colvin Dec for an evaluation of endometrial cancer.  The patient developed intermittent postmenopausal spotting/bleeding in 10/2022. Pelvic ultrasound on 01/25/2023 revealed uterus measuring 6.5 x 4.4 x 3.7 cm, with 1.9 cm intramural fibroid.  Endometrium measured 5 mm with normal echotexture.  Neither ovaries visualized. Endometrial biopsy on 4/10 revealed FIGO grade 1 endometrioid adenocarcinoma.  Today, the patient comes in with her daughter.  She notes spotting that started in October 2024, which she describes as pink when she wiped intermittently.  In January, she developed increased bleeding similar to a light menses, enough bleeding to wear a pad all the time.  She had some increased bleeding after her biopsy, this seems to have stopped completely as of today.  She denies any associated pain and cramping.  She endorses a good appetite, denies any nausea or emesis.  Reports normal bowel bladder function.  She has recently diagnosed with Graves', started on methimazole .  She has follow-up with endocrinology next week.  Patient has a history of paroxysmal A-fib that developed after she had COVID in 2021 and was hospitalized for 3 weeks in the ICU.  She underwent cardioversion in 09/2019.  She is managed by cardiology at [facility], last seen in May this year.  She remains on Xarelto for the history of paroxysmal A-fib.  PAST MEDICAL HISTORY:  Past Medical History:  Diagnosis Date   Allergic state    Anxiety     when father passed   Arthritis    Asthma, mild persistent    allergy induced asthma    Cervical cancer screening 04/27/2011   Edema 04/27/2011   Graves disease 02/2023   Heart murmur    as an infant    Hyperlipidemia 04/27/2011   Hypertension 2010   Hypokalemia 04/27/2011   Measles as a child   Mumps as a child   Muscle cramp 10/14/2015   Obesity    Osteoarthritis of left knee 10/14/2015   Plantar fasciitis, left 01/19/2013   Preventative health care 02/13/2011   Sinusitis 02/13/2011     PAST SURGICAL HISTORY:  Past Surgical History:  Procedure Laterality Date   ENDOMETRIAL ABLATION  2006   menorraghia   KNEE SURGERY  2006   left knee   TONSILLECTOMY     TOTAL KNEE ARTHROPLASTY Left 10/15/2015   Procedure: LEFT TOTAL KNEE ARTHROPLASTY;  Surgeon: Genevie Kerns, MD;  Location: [facility];  Service: Orthopedics;  Laterality: Left;   TUBAL LIGATION      OB/GYN HISTORY:  OB History  Gravida Para Term Preterm AB Living  2 2 2   2   SAB IAB Ectopic Multiple Live Births      2    # Outcome Date GA Lbr Len/2nd Weight Sex Type Anes PTL Lv  2 Term           1 Term             No LMP recorded. Patient has had an ablation.  Age at menarche: 45  Age at menopause: Bleeding stopped in her 34s when she had endometrial elation.  Reports menopausal symptoms approximately 5 years ago Hx of HRT: denies Hx of STDs:  denies Last pap: 04/2023 - NIML, HR HPV negative History of abnormal pap smears: denies  SCREENING STUDIES:  Last mammogram: 2025  Last colonoscopy: Cologuard - 2024 Last bone mineral density: 2023  MEDICATIONS: Outpatient Encounter Medications as of 05/04/2023  Medication Sig   albuterol  (VENTOLIN  HFA) 108 (90 Base) MCG/ACT inhaler TAKE 2 PUFFS BY MOUTH EVERY 6 HOURS AS NEEDED FOR WHEEZE OR SHORTNESS OF BREATH   budesonide  (PULMICORT  FLEXHALER) 180 MCG/ACT inhaler Inhale 2 puffs into the lungs in the morning and at bedtime. (Patient taking differently: Inhale 2 puffs  into the lungs 2 (two) times daily as needed (allergies (spring & fall months ONLY)).)   fenofibrate  (TRICOR ) 145 MG tablet TAKE 1 TABLET BY MOUTH EVERY DAY   HYDROcodone -acetaminophen  (NORCO/VICODIN) 5-325 MG tablet Take 1 tablet by mouth every 4 (four) hours as needed for moderate pain (pain score 4-6). For AFTER surgery only, do not take and drive, monitor your tylenol  intake   methimazole  (TAPAZOLE ) 10 MG tablet Take 1 tablet (10 mg total) by mouth daily.   montelukast  (SINGULAIR ) 10 MG tablet Take 1 tablet (10 mg total) by mouth at bedtime. (Patient taking differently: Take 10 mg by mouth in the morning.)   rivaroxaban (XARELTO) 20 MG TABS tablet Take 20 mg by mouth in the morning.   senna-docusate (SENOKOT-S) 8.6-50 MG tablet Take 2 tablets by mouth at bedtime. For AFTER surgery, do not take if having diarrhea   valsartan -hydrochlorothiazide  (DIOVAN -HCT) 160-12.5 MG tablet TAKE 1 TABLET BY MOUTH EVERY DAY   [DISCONTINUED] metoprolol  succinate (TOPROL -XL) 50 MG 24 hr tablet TAKE 1 TABLET BY MOUTH DAILY. TAKE WITH OR IMMEDIATELY FOLLOWING A MEAL.   [DISCONTINUED] diltiazem (TIAZAC) 120 MG 24 hr capsule Take by mouth. (Patient not taking: Reported on 05/03/2023)   [DISCONTINUED] tiZANidine  (ZANAFLEX ) 2 MG tablet Take 0.5-2 tablets (1-4 mg total) by mouth every 8 (eight) hours as needed for muscle spasms. (Patient not taking: Reported on 03/22/2023)   No facility-administered encounter medications on file as of 05/04/2023.    ALLERGIES:  Allergies  Allergen Reactions   Codeine Palpitations and Rash     FAMILY HISTORY:  Family History  Problem Relation Age of Onset   Cancer Mother 64       metastatic lung cancer   Stroke Mother    Cancer Father        kidney   Factor V Leiden deficiency Maternal Aunt    Cancer Maternal Grandmother        bone   Factor V Leiden deficiency Maternal Grandmother    Cancer Paternal Grandmother        breast   Breast cancer Paternal Grandmother    Heart  attack Paternal Grandfather    Asthma Daughter    Asthma Daughter    Ovarian cancer Neg Hx    Endometrial cancer Neg Hx    Pancreatic cancer Neg Hx    Colon cancer Neg Hx    Prostate cancer Neg Hx      SOCIAL HISTORY:  Social Connections: Unknown (05/22/2021)   Received from [facility]   Social Network    Social Network: Not on file    REVIEW OF SYSTEMS:  + Fatigue, weight gain, vaginal bleeding, vaginal discharge Denies appetite changes, fevers, chills. Denies hearing loss, neck lumps or masses, mouth sores, ringing in ears or voice changes. Denies cough or wheezing.  Denies shortness of breath. Denies chest pain or palpitations. Denies leg swelling. Denies abdominal distention, pain, blood in stools,  constipation, diarrhea, nausea, vomiting, or early satiety. Denies pain with intercourse, dysuria, frequency, hematuria or incontinence. Denies hot flashes, pelvic pain.   Denies joint pain, back pain or muscle pain/cramps. Denies itching, rash, or wounds. Denies dizziness, headaches, numbness or seizures. Denies swollen lymph nodes or glands, denies easy bruising or bleeding. Denies anxiety, depression, confusion, or decreased concentration.  Physical Exam:  Vital Signs for this encounter:  Blood pressure 132/79, pulse 73, temperature (!) 97.5 F (36.4 C), temperature source Oral, resp. rate 17, height 5' 8.19" (1.732 m), weight 257 lb 6.4 oz (116.8 kg), SpO2 98%. Body mass index is 38.92 kg/m. General: Alert, oriented, no acute distress.  HEENT: Normocephalic, atraumatic. Sclera anicteric.  Chest: Clear to auscultation bilaterally. No wheezes, rhonchi, or rales. Cardiovascular: Regular rate and rhythm, no murmurs, rubs, or gallops.  Abdomen: Obese. Normoactive bowel sounds. Soft, nondistended, nontender to palpation. No masses or hepatosplenomegaly appreciated. No palpable fluid wave.  Extremities: Grossly normal range of motion. Warm, well perfused. No  edema bilaterally.  Skin: No rashes or lesions.  Lymphatics: No cervical, supraclavicular, or inguinal adenopathy.  GU:  Normal external female genitalia. No lesions. No discharge or bleeding.             Bladder/urethra:  No lesions or masses, well supported bladder             Vagina: Well-rugated, no lesions.             Cervix: Normal appearing, no lesions.             Uterus: Small, mobile, no parametrial involvement or nodularity.             Adnexa: No masses.  Rectal: Deferred.  LABORATORY AND RADIOLOGIC DATA:  Outside medical records were reviewed to synthesize the above history, along with the history and physical obtained during the visit.   Lab Results  Component Value Date   WBC 9.9 01/25/2023   HGB 13.3 01/25/2023   HCT 40.8 01/25/2023   PLT 402.0 (H) 01/25/2023   GLUCOSE 86 01/26/2023   CHOL 148 01/25/2023   TRIG 144.0 01/25/2023   HDL 38.60 (L) 01/25/2023   LDLDIRECT 101.0 11/25/2019   LDLCALC 80 01/25/2023   ALT 52 (H) 01/26/2023   AST 45 (H) 01/26/2023   NA 143 01/26/2023   K 3.8 01/26/2023   CL 105 01/26/2023   CREATININE 0.54 01/26/2023   BUN 17 01/26/2023   CO2 25 01/26/2023   TSH 0.36 (L) 05/03/2023   INR 1.03 10/08/2015   HGBA1C 5.1 01/19/2022

## 2023-05-03 NOTE — H&P (View-Only) (Signed)
 GYNECOLOGIC ONCOLOGY NEW PATIENT CONSULTATION   Patient Name: Kathleen Church  Patient Age: 63 y.o. Date of Service: 05/04/23 Referring Provider: Jonel Nephew, MD  Primary Care Provider: Neda Balk, MD Consulting Provider: Wiley Hanger, MD   Assessment/Plan:  Postmenopausal patient with low-grade endometrioid endometrial adenocarcinoma.  We reviewed the nature of endometrial cancer and its recommended surgical staging, including total hysterectomy, bilateral salpingo-oophorectomy, and lymph node assessment. The patient is a suitable candidate for staging via a minimally invasive approach to surgery.  We reviewed that robotic assistance would be used to complete the surgery.   We discussed that most endometrial cancer is detected early and that decisions regarding adjuvant therapy will be made based on her final pathology.   We reviewed the sentinel lymph node technique. Risks and benefits of sentinel lymph node biopsy was reviewed. We reviewed the technique and ICG dye. The patient DOES NOT have an iodine allergy or known liver dysfunction. We reviewed the false negative rate (0.4%), and that 3% of patients with metastatic disease will not have it detected by SLN biopsy in endometrial cancer. A low risk of allergic reaction to the dye, <0.2% for ICG, has been reported. We also discussed that in the case of failed mapping, which occurs 40% of the time, a bilateral or unilateral lymphadenectomy will be performed at the surgeon's discretion.   Potential benefits of sentinel nodes including a higher detection rate for metastasis due to ultrastaging and potential reduction in operative morbidity. However, there remains uncertainty as to the role for treatment of micrometastatic disease. Further, the benefit of operative morbidity associated with the SLN technique in endometrial cancer is not yet completely known. In other patient populations (e.g. the cervical cancer population) there has been  observed reductions in morbidity with SLN biopsy compared to pelvic lymphadenectomy. Lymphedema, nerve dysfunction and lymphocysts are all potential risks with the SLN technique as with complete lymphadenectomy. Additional risks to the patient include the risk of damage to an internal organ while operating in an altered view (e.g. the black and white image of the robotic fluorescence imaging mode).   We discussed the plan for a robotic assisted hysterectomy, bilateral salpingo-oophorectomy, sentinel lymph node evaluation, possible lymph node dissection, possible laparotomy. The risks of surgery were discussed in detail and she understands these to include infection; wound separation; hernia; vaginal cuff separation, injury to adjacent organs such as bowel, bladder, blood vessels, ureters and nerves; bleeding which may require blood transfusion; anesthesia risk; thromboembolic events; possible death; unforeseen complications; possible need for re-exploration; medical complications such as heart attack, stroke, pleural effusion and pneumonia; and, if full lymphadenectomy is performed the risk of lymphedema and lymphocyst. The patient will receive DVT and antibiotic prophylaxis as indicated. She voiced a clear understanding. She had the opportunity to ask questions. Perioperative instructions were reviewed with her. Prescriptions for post-op medications were sent to her pharmacy of choice.  Given her history of A-fib on Xarelto, we will reach out to cardiology for clearance.  We discussed holding therapeutic dose of Xarelto and restarting prophylactic dose after surgery generally for 5-7 days before increasing back to therapeutic.  We will also reach out to her endocrinologist for any perioperative recommendations in the setting of her Graves disease.  A copy of this note was sent to the patient's referring provider.   70 minutes of total time was spent for this patient encounter, including preparation,  face-to-face counseling with the patient and coordination of care, and documentation of the encounter.  Wiley Hanger, MD  Division of Gynecologic Oncology  Department of Obstetrics and Gynecology  [facility]  ___________________________________________  Chief Complaint: Chief Complaint  Patient presents with   endometrial cancer    History of Present Illness:  Kathleen Church is a 63 y.o. y.o. female who is seen in consultation at the request of Dr. Colvin Dec for an evaluation of endometrial cancer.  The patient developed intermittent postmenopausal spotting/bleeding in 10/2022. Pelvic ultrasound on 01/25/2023 revealed uterus measuring 6.5 x 4.4 x 3.7 cm, with 1.9 cm intramural fibroid.  Endometrium measured 5 mm with normal echotexture.  Neither ovaries visualized. Endometrial biopsy on 4/10 revealed FIGO grade 1 endometrioid adenocarcinoma.  Today, the patient comes in with her daughter.  She notes spotting that started in October 2024, which she describes as pink when she wiped intermittently.  In January, she developed increased bleeding similar to a light menses, enough bleeding to wear a pad all the time.  She had some increased bleeding after her biopsy, this seems to have stopped completely as of today.  She denies any associated pain and cramping.  She endorses a good appetite, denies any nausea or emesis.  Reports normal bowel bladder function.  She has recently diagnosed with Graves', started on methimazole .  She has follow-up with endocrinology next week.  Patient has a history of paroxysmal A-fib that developed after she had COVID in 2021 and was hospitalized for 3 weeks in the ICU.  She underwent cardioversion in 09/2019.  She is managed by cardiology at [facility], last seen in May this year.  She remains on Xarelto for the history of paroxysmal A-fib.  PAST MEDICAL HISTORY:  Past Medical History:  Diagnosis Date   Allergic state    Anxiety     when father passed   Arthritis    Asthma, mild persistent    allergy induced asthma    Cervical cancer screening 04/27/2011   Edema 04/27/2011   Graves disease 02/2023   Heart murmur    as an infant    Hyperlipidemia 04/27/2011   Hypertension 2010   Hypokalemia 04/27/2011   Measles as a child   Mumps as a child   Muscle cramp 10/14/2015   Obesity    Osteoarthritis of left knee 10/14/2015   Plantar fasciitis, left 01/19/2013   Preventative health care 02/13/2011   Sinusitis 02/13/2011     PAST SURGICAL HISTORY:  Past Surgical History:  Procedure Laterality Date   ENDOMETRIAL ABLATION  2006   menorraghia   KNEE SURGERY  2006   left knee   TONSILLECTOMY     TOTAL KNEE ARTHROPLASTY Left 10/15/2015   Procedure: LEFT TOTAL KNEE ARTHROPLASTY;  Surgeon: Genevie Kerns, MD;  Location: [facility];  Service: Orthopedics;  Laterality: Left;   TUBAL LIGATION      OB/GYN HISTORY:  OB History  Gravida Para Term Preterm AB Living  2 2 2   2   SAB IAB Ectopic Multiple Live Births      2    # Outcome Date GA Lbr Len/2nd Weight Sex Type Anes PTL Lv  2 Term           1 Term             No LMP recorded. Patient has had an ablation.  Age at menarche: 45  Age at menopause: Bleeding stopped in her 34s when she had endometrial elation.  Reports menopausal symptoms approximately 5 years ago Hx of HRT: denies Hx of STDs:  denies Last pap: 04/2023 - NIML, HR HPV negative History of abnormal pap smears: denies  SCREENING STUDIES:  Last mammogram: 2025  Last colonoscopy: Cologuard - 2024 Last bone mineral density: 2023  MEDICATIONS: Outpatient Encounter Medications as of 05/04/2023  Medication Sig   albuterol  (VENTOLIN  HFA) 108 (90 Base) MCG/ACT inhaler TAKE 2 PUFFS BY MOUTH EVERY 6 HOURS AS NEEDED FOR WHEEZE OR SHORTNESS OF BREATH   budesonide  (PULMICORT  FLEXHALER) 180 MCG/ACT inhaler Inhale 2 puffs into the lungs in the morning and at bedtime. (Patient taking differently: Inhale 2 puffs  into the lungs 2 (two) times daily as needed (allergies (spring & fall months ONLY)).)   fenofibrate  (TRICOR ) 145 MG tablet TAKE 1 TABLET BY MOUTH EVERY DAY   HYDROcodone -acetaminophen  (NORCO/VICODIN) 5-325 MG tablet Take 1 tablet by mouth every 4 (four) hours as needed for moderate pain (pain score 4-6). For AFTER surgery only, do not take and drive, monitor your tylenol  intake   methimazole  (TAPAZOLE ) 10 MG tablet Take 1 tablet (10 mg total) by mouth daily.   montelukast  (SINGULAIR ) 10 MG tablet Take 1 tablet (10 mg total) by mouth at bedtime. (Patient taking differently: Take 10 mg by mouth in the morning.)   rivaroxaban (XARELTO) 20 MG TABS tablet Take 20 mg by mouth in the morning.   senna-docusate (SENOKOT-S) 8.6-50 MG tablet Take 2 tablets by mouth at bedtime. For AFTER surgery, do not take if having diarrhea   valsartan -hydrochlorothiazide  (DIOVAN -HCT) 160-12.5 MG tablet TAKE 1 TABLET BY MOUTH EVERY DAY   [DISCONTINUED] metoprolol  succinate (TOPROL -XL) 50 MG 24 hr tablet TAKE 1 TABLET BY MOUTH DAILY. TAKE WITH OR IMMEDIATELY FOLLOWING A MEAL.   [DISCONTINUED] diltiazem (TIAZAC) 120 MG 24 hr capsule Take by mouth. (Patient not taking: Reported on 05/03/2023)   [DISCONTINUED] tiZANidine  (ZANAFLEX ) 2 MG tablet Take 0.5-2 tablets (1-4 mg total) by mouth every 8 (eight) hours as needed for muscle spasms. (Patient not taking: Reported on 03/22/2023)   No facility-administered encounter medications on file as of 05/04/2023.    ALLERGIES:  Allergies  Allergen Reactions   Codeine Palpitations and Rash     FAMILY HISTORY:  Family History  Problem Relation Age of Onset   Cancer Mother 64       metastatic lung cancer   Stroke Mother    Cancer Father        kidney   Factor V Leiden deficiency Maternal Aunt    Cancer Maternal Grandmother        bone   Factor V Leiden deficiency Maternal Grandmother    Cancer Paternal Grandmother        breast   Breast cancer Paternal Grandmother    Heart  attack Paternal Grandfather    Asthma Daughter    Asthma Daughter    Ovarian cancer Neg Hx    Endometrial cancer Neg Hx    Pancreatic cancer Neg Hx    Colon cancer Neg Hx    Prostate cancer Neg Hx      SOCIAL HISTORY:  Social Connections: Unknown (05/22/2021)   Received from [facility]   Social Network    Social Network: Not on file    REVIEW OF SYSTEMS:  + Fatigue, weight gain, vaginal bleeding, vaginal discharge Denies appetite changes, fevers, chills. Denies hearing loss, neck lumps or masses, mouth sores, ringing in ears or voice changes. Denies cough or wheezing.  Denies shortness of breath. Denies chest pain or palpitations. Denies leg swelling. Denies abdominal distention, pain, blood in stools,  constipation, diarrhea, nausea, vomiting, or early satiety. Denies pain with intercourse, dysuria, frequency, hematuria or incontinence. Denies hot flashes, pelvic pain.   Denies joint pain, back pain or muscle pain/cramps. Denies itching, rash, or wounds. Denies dizziness, headaches, numbness or seizures. Denies swollen lymph nodes or glands, denies easy bruising or bleeding. Denies anxiety, depression, confusion, or decreased concentration.  Physical Exam:  Vital Signs for this encounter:  Blood pressure 132/79, pulse 73, temperature (!) 97.5 F (36.4 C), temperature source Oral, resp. rate 17, height 5' 8.19" (1.732 m), weight 257 lb 6.4 oz (116.8 kg), SpO2 98%. Body mass index is 38.92 kg/m. General: Alert, oriented, no acute distress.  HEENT: Normocephalic, atraumatic. Sclera anicteric.  Chest: Clear to auscultation bilaterally. No wheezes, rhonchi, or rales. Cardiovascular: Regular rate and rhythm, no murmurs, rubs, or gallops.  Abdomen: Obese. Normoactive bowel sounds. Soft, nondistended, nontender to palpation. No masses or hepatosplenomegaly appreciated. No palpable fluid wave.  Extremities: Grossly normal range of motion. Warm, well perfused. No  edema bilaterally.  Skin: No rashes or lesions.  Lymphatics: No cervical, supraclavicular, or inguinal adenopathy.  GU:  Normal external female genitalia. No lesions. No discharge or bleeding.             Bladder/urethra:  No lesions or masses, well supported bladder             Vagina: Well-rugated, no lesions.             Cervix: Normal appearing, no lesions.             Uterus: Small, mobile, no parametrial involvement or nodularity.             Adnexa: No masses.  Rectal: Deferred.  LABORATORY AND RADIOLOGIC DATA:  Outside medical records were reviewed to synthesize the above history, along with the history and physical obtained during the visit.   Lab Results  Component Value Date   WBC 9.9 01/25/2023   HGB 13.3 01/25/2023   HCT 40.8 01/25/2023   PLT 402.0 (H) 01/25/2023   GLUCOSE 86 01/26/2023   CHOL 148 01/25/2023   TRIG 144.0 01/25/2023   HDL 38.60 (L) 01/25/2023   LDLDIRECT 101.0 11/25/2019   LDLCALC 80 01/25/2023   ALT 52 (H) 01/26/2023   AST 45 (H) 01/26/2023   NA 143 01/26/2023   K 3.8 01/26/2023   CL 105 01/26/2023   CREATININE 0.54 01/26/2023   BUN 17 01/26/2023   CO2 25 01/26/2023   TSH 0.36 (L) 05/03/2023   INR 1.03 10/08/2015   HGBA1C 5.1 01/19/2022

## 2023-05-04 ENCOUNTER — Inpatient Hospital Stay: Attending: Gynecologic Oncology | Admitting: Gynecologic Oncology

## 2023-05-04 ENCOUNTER — Telehealth: Payer: Self-pay | Admitting: *Deleted

## 2023-05-04 ENCOUNTER — Encounter: Payer: Self-pay | Admitting: Gynecologic Oncology

## 2023-05-04 ENCOUNTER — Inpatient Hospital Stay: Admitting: Gynecologic Oncology

## 2023-05-04 VITALS — BP 132/79 | HR 73 | Temp 97.5°F | Resp 17 | Ht 68.19 in | Wt 257.4 lb

## 2023-05-04 DIAGNOSIS — E0500 Thyrotoxicosis with diffuse goiter without thyrotoxic crisis or storm: Secondary | ICD-10-CM | POA: Diagnosis not present

## 2023-05-04 DIAGNOSIS — C541 Malignant neoplasm of endometrium: Secondary | ICD-10-CM

## 2023-05-04 DIAGNOSIS — I10 Essential (primary) hypertension: Secondary | ICD-10-CM | POA: Diagnosis not present

## 2023-05-04 DIAGNOSIS — E785 Hyperlipidemia, unspecified: Secondary | ICD-10-CM | POA: Diagnosis not present

## 2023-05-04 DIAGNOSIS — I480 Paroxysmal atrial fibrillation: Secondary | ICD-10-CM | POA: Diagnosis not present

## 2023-05-04 DIAGNOSIS — Z808 Family history of malignant neoplasm of other organs or systems: Secondary | ICD-10-CM | POA: Diagnosis not present

## 2023-05-04 DIAGNOSIS — M1990 Unspecified osteoarthritis, unspecified site: Secondary | ICD-10-CM | POA: Diagnosis not present

## 2023-05-04 DIAGNOSIS — Z7901 Long term (current) use of anticoagulants: Secondary | ICD-10-CM | POA: Insufficient documentation

## 2023-05-04 DIAGNOSIS — Z801 Family history of malignant neoplasm of trachea, bronchus and lung: Secondary | ICD-10-CM | POA: Diagnosis not present

## 2023-05-04 DIAGNOSIS — R011 Cardiac murmur, unspecified: Secondary | ICD-10-CM | POA: Diagnosis not present

## 2023-05-04 DIAGNOSIS — Z8051 Family history of malignant neoplasm of kidney: Secondary | ICD-10-CM | POA: Insufficient documentation

## 2023-05-04 DIAGNOSIS — N950 Postmenopausal bleeding: Secondary | ICD-10-CM | POA: Diagnosis not present

## 2023-05-04 DIAGNOSIS — E669 Obesity, unspecified: Secondary | ICD-10-CM | POA: Diagnosis not present

## 2023-05-04 DIAGNOSIS — Z6838 Body mass index (BMI) 38.0-38.9, adult: Secondary | ICD-10-CM | POA: Diagnosis not present

## 2023-05-04 DIAGNOSIS — Z79899 Other long term (current) drug therapy: Secondary | ICD-10-CM | POA: Insufficient documentation

## 2023-05-04 DIAGNOSIS — Z8542 Personal history of malignant neoplasm of other parts of uterus: Secondary | ICD-10-CM | POA: Insufficient documentation

## 2023-05-04 LAB — TSH: TSH: 0.36 m[IU]/L — ABNORMAL LOW (ref 0.40–4.50)

## 2023-05-04 LAB — T3, FREE: T3, Free: 3.4 pg/mL (ref 2.3–4.2)

## 2023-05-04 LAB — T4, FREE: Free T4: 1.2 ng/dL (ref 0.8–1.8)

## 2023-05-04 MED ORDER — HYDROCODONE-ACETAMINOPHEN 5-325 MG PO TABS
1.0000 | ORAL_TABLET | ORAL | 0 refills | Status: DC | PRN
Start: 2023-05-04 — End: 2023-06-14

## 2023-05-04 MED ORDER — SENNOSIDES-DOCUSATE SODIUM 8.6-50 MG PO TABS: 2.0000 | ORAL_TABLET | Freq: Every day | ORAL | 0 refills | Status: DC

## 2023-05-04 NOTE — Patient Instructions (Signed)
 Preparing for your Surgery   Plan for surgery on May 16, 2023 with Dr. Wiley Hanger at [facility]. You will be scheduled for robotic assisted total laparoscopic hysterectomy (removal of the uterus and cervix), bilateral salpingo-oophorectomy (removal of both ovaries and fallopian tubes), sentinel lymph node biopsy, possible lymph node dissection, possible laparotomy (larger incision on your abdomen if needed).    We will reach out to your prescribing provider for xarelto recommendations before and after surgery.   Pre-operative Testing -You will receive a phone call from presurgical testing at [facility] to arrange for a pre-operative appointment and lab work.   -Bring your insurance card, copy of an advanced directive if applicable, medication list   -At that visit, you will be asked to sign a consent for a possible blood transfusion in case a transfusion becomes necessary during surgery.  The need for a blood transfusion is rare but having consent is a necessary part of your care.      -Do not take supplements such as fish oil (omega 3), red yeast rice, turmeric before your surgery. STOP TAKING AT LEAST 10 DAYS BEFORE SURGERY. You want to avoid medications with aspirin  in them including headache powders such as BC or Goody's), Excedrin migraine.   Day Before Surgery at Home -You will be asked to take in a light diet the day before surgery. You will be advised you can have clear liquids up until 3 hours before your surgery.     Eat a light diet the day before surgery.  Examples including soups, broths, toast, yogurt, mashed potatoes.  AVOID GAS PRODUCING FOODS AND BEVERAGES. Things to avoid include carbonated beverages (fizzy beverages, sodas), raw fruits and raw vegetables (uncooked), or beans.    If your bowels are filled with gas, your surgeon will have difficulty visualizing your pelvic organs which increases your surgical risks.   Your role in recovery Your role is  to become active as soon as directed by your doctor, while still giving yourself time to heal.  Rest when you feel tired. You will be asked to do the following in order to speed your recovery:   - Cough and breathe deeply. This helps to clear and expand your lungs and can prevent pneumonia after surgery.  - STAY ACTIVE WHEN YOU GET HOME. Do mild physical activity. Walking or moving your legs help your circulation and body functions return to normal. Do not try to get up or walk alone the first time after surgery.   -If you develop swelling on one leg or the other, pain in the back of your leg, redness/warmth in one of your legs, please call the office or go to the Emergency Room to have a doppler to rule out a blood clot. For shortness of breath, chest pain-seek care in the Emergency Room as soon as possible. - Actively manage your pain. Managing your pain lets you move in comfort. We will ask you to rate your pain on a scale of zero to 10. It is your responsibility to tell your doctor or nurse where and how much you hurt so your pain can be treated.   Special Considerations -If you are diabetic, you may be placed on insulin after surgery to have closer control over your blood sugars to promote healing and recovery.  This does not mean that you will be discharged on insulin.  If applicable, your oral antidiabetics will be resumed when you are tolerating a solid diet.   -Your  final pathology results from surgery should be available around one week after surgery and the results will be relayed to you when available.   -FMLA forms can be faxed to [phone] and please allow 5-7 business days for completion.   Pain Management After Surgery -You will be prescribed your pain medication and bowel regimen medications before surgery so that you can have these available when you are discharged from the hospital. The pain medication is for use ONLY AFTER surgery and a new prescription will not be given.     -Make sure that you have Tylenol  IF YOU ARE ABLE TO TAKE THESE MEDICATION at home to use on a regular basis after surgery for pain control.    -Review the attached handout on narcotic use and their risks and side effects.    Bowel Regimen -You will be prescribed Sennakot-S to take nightly to prevent constipation especially if you are taking the narcotic pain medication intermittently.  It is important to prevent constipation and drink adequate amounts of liquids. You can stop taking this medication when you are not taking pain medication and you are back on your normal bowel routine.   Risks of Surgery Risks of surgery are low but include bleeding, infection, damage to surrounding structures, re-operation, blood clots, and very rarely death.     Blood Transfusion Information (For the consent to be signed before surgery)   We will be checking your blood type before surgery so in case of emergencies, we will know what type of blood you would need.                                             WHAT IS A BLOOD TRANSFUSION?   A transfusion is the replacement of blood or some of its parts. Blood is made up of multiple cells which provide different functions. Red blood cells carry oxygen and are used for blood loss replacement. White blood cells fight against infection. Platelets control bleeding. Plasma helps clot blood. Other blood products are available for specialized needs, such as hemophilia or other clotting disorders. BEFORE THE TRANSFUSION  Who gives blood for transfusions?  You may be able to donate blood to be used at a later date on yourself (autologous donation). Relatives can be asked to donate blood. This is generally not any safer than if you have received blood from a stranger. The same precautions are taken to ensure safety when a relative's blood is donated. Healthy volunteers who are fully evaluated to make sure their blood is safe. This is blood bank blood. Transfusion  therapy is the safest it has ever been in the practice of medicine. Before blood is taken from a donor, a complete history is taken to make sure that person has no history of diseases nor engages in risky social behavior (examples are intravenous drug use or sexual activity with multiple partners). The donor's travel history is screened to minimize risk of transmitting infections, such as malaria. The donated blood is tested for signs of infectious diseases, such as HIV and hepatitis. The blood is then tested to be sure it is compatible with you in order to minimize the chance of a transfusion reaction. If you or a relative donates blood, this is often done in anticipation of surgery and is not appropriate for emergency situations. It takes many days to process the donated blood. RISKS AND  COMPLICATIONS Although transfusion therapy is very safe and saves many lives, the main dangers of transfusion include:  Getting an infectious disease. Developing a transfusion reaction. This is an allergic reaction to something in the blood you were given. Every precaution is taken to prevent this. The decision to have a blood transfusion has been considered carefully by your caregiver before blood is given. Blood is not given unless the benefits outweigh the risks.   AFTER SURGERY INSTRUCTIONS   Return to work: 4-6 weeks if applicable   Activity: 1. Be up and out of the bed during the day.  Take a nap if needed.  You may walk up steps but be careful and use the hand rail.  Stair climbing will tire you more than you think, you may need to stop part way and rest.    2. No lifting or straining for 6 weeks over 10 pounds. No pushing, pulling, straining for 6 weeks.   3. No driving for 4-09 days when the following criteria have been met: Do not drive if you are taking narcotic pain medicine and make sure that your reaction time has returned.    4. You can shower as soon as the next day after surgery. Shower daily.   Use your regular soap and water  (not directly on the incision) and pat your incision(s) dry afterwards; don't rub.  No tub baths or submerging your body in water  until cleared by your surgeon. If you have the soap that was given to you by pre-surgical testing that was used before surgery, you do not need to use it afterwards because this can irritate your incisions.    5. No sexual activity and nothing in the vagina for 10-12 weeks.   6. You may experience a small amount of clear drainage from your incisions, which is normal.  If the drainage persists, increases, or changes color please call the office.   7. Do not use creams, lotions, or ointments such as neosporin on your incisions after surgery until advised by your surgeon because they can cause removal of the dermabond glue on your incisions.     8. You may experience vaginal spotting after surgery or when the stitches at the top of the vagina begin to dissolve.  The spotting is normal but if you experience heavy bleeding, call our office.   9. Take Tylenol  first for pain if you are able to take these medication and only use narcotic pain medication for severe pain not relieved by the Tylenol .  Monitor your Tylenol  intake to a max of 4,000 mg in a 24 hour period.    Diet: 1. Low sodium Heart Healthy Diet is recommended but you are cleared to resume your normal (before surgery) diet after your procedure.   2. It is safe to use a laxative, such as Miralax  or Colace, if you have difficulty moving your bowels before surgery. You have been prescribed Sennakot-S to take at bedtime every evening after surgery to keep bowel movements regular and to prevent constipation.     Wound Care: 1. Keep clean and dry.  Shower daily.   Reasons to call the Doctor: Fever - Oral temperature greater than 100.4 degrees Fahrenheit Foul-smelling vaginal discharge Difficulty urinating Nausea and vomiting Increased pain at the site of the incision that is  unrelieved with pain medicine. Difficulty breathing with or without chest pain New calf pain especially if only on one side Sudden, continuing increased vaginal bleeding with or without clots.   Contacts:  For questions or concerns you should contact:   Dr. Wiley Hanger at [phone]   Vira Grieves, NP at [phone]   After Hours: call [phone] and have the GYN Oncologist paged/contacted (after 5 pm or on the weekends). You will speak with an after hours RN and let he or she know you have had surgery.   Messages sent via mychart are for non-urgent matters and are not responded to after hours so for urgent needs, please call the after hours number.

## 2023-05-04 NOTE — Telephone Encounter (Signed)
 Per Dr Orvil Bland fax optimization form to the following offices   Dr [facility] Cardiology [phone]) Dr Vertell Gory Endocrinology [phone])  Dr Rodrick Clapper PCP  [phone])

## 2023-05-04 NOTE — Progress Notes (Signed)
 Patient here for new patient consultation with Dr. Orvil Bland and for a pre-operative appointment prior to her scheduled surgery on 05/16/2023. She is scheduled for robotic assisted total laparoscopic hysterectomy, bilateral salpingo-oophorectomy, sentinel lymph node biopsy, possible lymph node dissection, possible laparotomy.  The surgery was discussed in detail.  See after visit summary for additional details.    Discussed post-op pain management in detail including the aspects of the enhanced recovery pathway.  Advised her that a new prescription would be sent in for hydrocodone /APAP and it is only to be used for after her upcoming surgery.  We discussed the use of tylenol  post-op and to monitor for a maximum of 4,000 mg in a 24 hour period.  Also prescribed sennakot to be used after surgery and to hold if having loose stools.  Discussed bowel regimen in detail.     Discussed measures to take at home to prevent DVT including frequent mobility.  Reportable signs and symptoms of DVT discussed. Post-operative instructions discussed and expectations for after surgery. Incisional care discussed as well including reportable signs and symptoms including erythema, drainage, wound separation.     10 minutes spent preparing information and with the patient.  Verbalizing understanding of material discussed. No needs or concerns voiced at the end of the visit.   Advised patient to call for any needs.  Advised that her post-operative medications had been prescribed and could be picked up at any time.    This appointment is included in the global surgical bundle as pre-operative teaching and has no charge.

## 2023-05-04 NOTE — Patient Instructions (Addendum)
 Preparing for your Surgery  Plan for surgery on May 16, 2023 with Dr. Wiley Hanger at [facility]. You will be scheduled for robotic assisted total laparoscopic hysterectomy (removal of the uterus and cervix), bilateral salpingo-oophorectomy (removal of both ovaries and fallopian tubes), sentinel lymph node biopsy, possible lymph node dissection, possible laparotomy (larger incision on your abdomen if needed).   We will reach out to your prescribing provider for xarelto  recommendations before and after surgery.  Pre-operative Testing -You will receive a phone call from presurgical testing at [facility] to arrange for a pre-operative appointment and lab work.  -Bring your insurance card, copy of an advanced directive if applicable, medication list  -At that visit, you will be asked to sign a consent for a possible blood transfusion in case a transfusion becomes necessary during surgery.  The need for a blood transfusion is rare but having consent is a necessary part of your care.     -Do not take supplements such as fish oil (omega 3), red yeast rice, turmeric before your surgery. STOP TAKING AT LEAST 10 DAYS BEFORE SURGERY. You want to avoid medications with aspirin  in them including headache powders such as BC or Goody's), Excedrin migraine.  Day Before Surgery at Home -You will be asked to take in a light diet the day before surgery. You will be advised you can have clear liquids up until 3 hours before your surgery.    Eat a light diet the day before surgery.  Examples including soups, broths, toast, yogurt, mashed potatoes.  AVOID GAS PRODUCING FOODS AND BEVERAGES. Things to avoid include carbonated beverages (fizzy beverages, sodas), raw fruits and raw vegetables (uncooked), or beans.   If your bowels are filled with gas, your surgeon will have difficulty visualizing your pelvic organs which increases your surgical risks.  Your role in recovery Your role is to become  active as soon as directed by your doctor, while still giving yourself time to heal.  Rest when you feel tired. You will be asked to do the following in order to speed your recovery:  - Cough and breathe deeply. This helps to clear and expand your lungs and can prevent pneumonia after surgery.  - STAY ACTIVE WHEN YOU GET HOME. Do mild physical activity. Walking or moving your legs help your circulation and body functions return to normal. Do not try to get up or walk alone the first time after surgery.   -If you develop swelling on one leg or the other, pain in the back of your leg, redness/warmth in one of your legs, please call the office or go to the Emergency Room to have a doppler to rule out a blood clot. For shortness of breath, chest pain-seek care in the Emergency Room as soon as possible. - Actively manage your pain. Managing your pain lets you move in comfort. We will ask you to rate your pain on a scale of zero to 10. It is your responsibility to tell your doctor or nurse where and how much you hurt so your pain can be treated.  Special Considerations -If you are diabetic, you may be placed on insulin after surgery to have closer control over your blood sugars to promote healing and recovery.  This does not mean that you will be discharged on insulin.  If applicable, your oral antidiabetics will be resumed when you are tolerating a solid diet.  -Your final pathology results from surgery should be available around one week after surgery  final pathology results from surgery should be available around one week after surgery and the results will be relayed to you when available.   -FMLA forms can be faxed to [phone] and please allow 5-7 business days for completion.   Pain Management After Surgery -You will be prescribed your pain medication and bowel regimen medications before surgery so that you can have these available when you are discharged from the hospital. The pain medication is for use ONLY AFTER surgery and a new prescription will not be given.     -Make sure that you have Tylenol  IF YOU ARE ABLE TO TAKE THESE MEDICATION at home to use on a regular basis after surgery for pain control.    -Review the attached handout on narcotic use and their risks and side effects.    Bowel Regimen -You will be prescribed Sennakot-S to take nightly to prevent constipation especially if you are taking the narcotic pain medication intermittently.  It is important to prevent constipation and drink adequate amounts of liquids. You can stop taking this medication when you are not taking pain medication and you are back on your normal bowel routine.   Risks of Surgery Risks of surgery are low but include bleeding, infection, damage to surrounding structures, re-operation, blood clots, and very rarely death.     Blood Transfusion Information (For the consent to be signed before surgery)   We will be checking your blood type before surgery so in case of emergencies, we will know what type of blood you would need.                                             WHAT IS A BLOOD TRANSFUSION?   A transfusion is the replacement of blood or some of its parts. Blood is made up of multiple cells which provide different functions. Red blood cells carry oxygen and are used for blood loss replacement. White blood cells fight against infection. Platelets control bleeding. Plasma helps clot blood. Other blood products are available for specialized needs, such as hemophilia or other clotting disorders. BEFORE THE TRANSFUSION  Who gives blood for transfusions?  You may be able to donate blood to be used at a later date on yourself (autologous donation). Relatives can be asked to donate blood. This is generally not any safer than if you have received blood from a stranger. The same precautions are taken to ensure safety when a relative's blood is donated. Healthy volunteers who are fully evaluated to make sure their blood is safe. This is blood bank blood. Transfusion  therapy is the safest it has ever been in the practice of medicine. Before blood is taken from a donor, a complete history is taken to make sure that person has no history of diseases nor engages in risky social behavior (examples are intravenous drug use or sexual activity with multiple partners). The donor's travel history is screened to minimize risk of transmitting infections, such as malaria. The donated blood is tested for signs of infectious diseases, such as HIV and hepatitis. The blood is then tested to be sure it is compatible with you in order to minimize the chance of a transfusion reaction. If you or a relative donates blood, this is often done in anticipation of surgery and is not appropriate for emergency situations. It takes many days to process the donated blood. RISKS AND  COMPLICATIONS Although transfusion therapy is very safe and saves many lives, the main dangers of transfusion include:  Getting an infectious disease. Developing a transfusion reaction. This is an allergic reaction to something in the blood you were given. Every precaution is taken to prevent this. The decision to have a blood transfusion has been considered carefully by your caregiver before blood is given. Blood is not given unless the benefits outweigh the risks.   AFTER SURGERY INSTRUCTIONS   Return to work: 4-6 weeks if applicable   Activity: 1. Be up and out of the bed during the day.  Take a nap if needed.  You may walk up steps but be careful and use the hand rail.  Stair climbing will tire you more than you think, you may need to stop part way and rest.    2. No lifting or straining for 6 weeks over 10 pounds. No pushing, pulling, straining for 6 weeks.   3. No driving for 4-09 days when the following criteria have been met: Do not drive if you are taking narcotic pain medicine and make sure that your reaction time has returned.    4. You can shower as soon as the next day after surgery. Shower daily.   Use your regular soap and water  (not directly on the incision) and pat your incision(s) dry afterwards; don't rub.  No tub baths or submerging your body in water  until cleared by your surgeon. If you have the soap that was given to you by pre-surgical testing that was used before surgery, you do not need to use it afterwards because this can irritate your incisions.    5. No sexual activity and nothing in the vagina for 10-12 weeks.   6. You may experience a small amount of clear drainage from your incisions, which is normal.  If the drainage persists, increases, or changes color please call the office.   7. Do not use creams, lotions, or ointments such as neosporin on your incisions after surgery until advised by your surgeon because they can cause removal of the dermabond glue on your incisions.     8. You may experience vaginal spotting after surgery or when the stitches at the top of the vagina begin to dissolve.  The spotting is normal but if you experience heavy bleeding, call our office.   9. Take Tylenol  first for pain if you are able to take these medication and only use narcotic pain medication for severe pain not relieved by the Tylenol .  Monitor your Tylenol  intake to a max of 4,000 mg in a 24 hour period.    Diet: 1. Low sodium Heart Healthy Diet is recommended but you are cleared to resume your normal (before surgery) diet after your procedure.   2. It is safe to use a laxative, such as Miralax  or Colace, if you have difficulty moving your bowels before surgery. You have been prescribed Sennakot-S to take at bedtime every evening after surgery to keep bowel movements regular and to prevent constipation.     Wound Care: 1. Keep clean and dry.  Shower daily.   Reasons to call the Doctor: Fever - Oral temperature greater than 100.4 degrees Fahrenheit Foul-smelling vaginal discharge Difficulty urinating Nausea and vomiting Increased pain at the site of the incision that is  unrelieved with pain medicine. Difficulty breathing with or without chest pain New calf pain especially if only on one side Sudden, continuing increased vaginal bleeding with or without clots.   Contacts:  For questions or concerns you should contact:   Dr. Wiley Hanger at [phone]   Vira Grieves, NP at [phone]   After Hours: call [phone] and have the GYN Oncologist paged/contacted (after 5 pm or on the weekends). You will speak with an after hours RN and let he or she know you have had surgery.   Messages sent via mychart are for non-urgent matters and are not responded to after hours so for urgent needs, please call the after hours number.

## 2023-05-07 ENCOUNTER — Telehealth: Payer: Self-pay | Admitting: Licensed Clinical Social Worker

## 2023-05-07 NOTE — Telephone Encounter (Signed)
 Received cardio clearance

## 2023-05-07 NOTE — Telephone Encounter (Signed)
 [facility] Clinical Social Work  Clinical Social Work was referred by new patient protocol for assessment of psychosocial needs.  Clinical Social Worker contacted patient by phone to offer support and assess for needs.   Pt was at work and unable to talk at this time. Requested a call back on 05/08/2023.     Kathleen Church E Anniya Whiters, LCSW  Clinical Social Worker [facility]

## 2023-05-08 ENCOUNTER — Ambulatory Visit: Admitting: "Endocrinology

## 2023-05-08 ENCOUNTER — Inpatient Hospital Stay: Admitting: Licensed Clinical Social Worker

## 2023-05-08 ENCOUNTER — Encounter: Payer: Self-pay | Admitting: "Endocrinology

## 2023-05-08 VITALS — BP 116/80 | HR 81 | Ht 68.19 in | Wt 256.0 lb

## 2023-05-08 DIAGNOSIS — E0500 Thyrotoxicosis with diffuse goiter without thyrotoxic crisis or storm: Secondary | ICD-10-CM | POA: Diagnosis not present

## 2023-05-08 MED ORDER — METHIMAZOLE 10 MG PO TABS: 10.0000 mg | ORAL_TABLET | Freq: Every day | ORAL | 1 refills | Status: DC

## 2023-05-08 NOTE — Progress Notes (Signed)
 [facility] Clinical Social Work  Initial Assessment   Kathleen Church is a 63 y.o. year old female contacted by phone. Clinical Social Work was referred by new patient protocol for assessment of psychosocial needs.   SDOH (Social Determinants of Health) assessments performed: Yes SDOH Interventions    Flowsheet Row Clinical Support from 05/08/2023 in [facility] - A Dept Of [name] Office Visit from 05/04/2023 in [facility] - A Dept Of [place]  SDOH Interventions    Food Insecurity Interventions Intervention Not Indicated --  Housing Interventions Intervention Not Indicated Intervention Not Indicated  Transportation Interventions Intervention Not Indicated Intervention Not Indicated  Utilities Interventions Intervention Not Indicated Intervention Not Indicated       SDOH Screenings   Food Insecurity: No Food Insecurity (05/08/2023)  Housing: Low Risk  (05/08/2023)  Transportation Needs: No Transportation Needs (05/08/2023)  Utilities: Not At Risk (05/03/2023)  Depression (PHQ2-9): Low Risk  (01/25/2023)  Financial Resource Strain: Low Risk  (09/04/2019)   Received from [facility], [facility]  Social Connections: Unknown (05/22/2021)   Received from [facility], [facility]  Stress: No Stress Concern Present (10/08/2019)   Received from [facility], [facility]  Tobacco Use: Low Risk  (05/08/2023)     Distress Screen completed: No     No data to display            Family/Social Information:  Housing Arrangement: patient lives alone. She is widowed (husband deceased since May 26, 2013). She has 2 daughters and grandkids locally Family members/support persons in your life? Family and Friends Transportation concerns: no  Employment: Working full time [employer] examiner.  Income source: Employment Financial concerns: No Type of concern: None Food access concerns: no Advanced directives: Not known Services  Currently in place:  [facility]  Coping/ Adjustment to diagnosis: Patient understands treatment plan and what happens next? 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 She also has a friend who had the same diagnosis and treatment in November and she has been a support for her. 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 Hopes and/or priorities: be able to see her grandchildren grow up Patient enjoys time with family/ friends and watching ball games Current coping skills/ strengths: Ability for insight , Active sense of humor , Capable of independent living , Communication skills , Motivation for treatment/growth , and Supportive family/friends     SUMMARY: Current SDOH Barriers:  No major barriers identified today  Clinical Social Work Clinical Goal(s):  No clinical social work goals at this time  Interventions: Discussed common feeling and emotions when being diagnosed with cancer, and the importance of support during treatment Informed patient of the support team roles and support services at [facility] Provided CSW contact information and encouraged patient to call with any questions or concerns   Follow Up Plan: Patient will contact CSW with any support or resource needs Patient verbalizes understanding of plan: Yes    Kharson Rasmusson E Jamaiyah Pyle, LCSW Clinical Social Worker [facility]

## 2023-05-08 NOTE — Patient Instructions (Signed)
  If you notice any symptoms of worsening fatigue, fever with sore throat, loss of appetite, yellowing of eyes, dark urine, joint pains, sores in the mouth, itchy rash, light colored stools or abdominal pain, please stop the medication and call us immediately as this can be a serious side effect of the medication.

## 2023-05-08 NOTE — Progress Notes (Signed)
 Outpatient Endocrinology Note [name], MD  05/08/23   [name] 16-Jul-1960 [id number]  Referring Provider: Neda Balk, MD Primary Care Provider: Neda Balk, MD Subjective  No chief complaint on file.   Assessment & Plan  Diagnoses and all orders for this visit:  Graves disease -     TSH -     T4, free -     T3, free -     methimazole  (TAPAZOLE ) 10 MG tablet; Take 1 tablet (10 mg total) by mouth daily. -     TSH    Kathleen Church is currently taking methimazole  10 mg qd. Patient currently biochemically near-euthyroid.  Discussed the etiology for hyperthyroidism. Educated on thyroid  axis.  Recommend the following: Take methimazole  10 mg once a day. Repeat labs in 3 months or sooner if symptoms of hyper or hypothyroidism develop.  Educated on definitive options of treatment including RAI therapy and surgery. Patient does not want RAI at this time.  Counseled on: -complications of untreated hyperthyroidism including atrial fibrillation, heart failure and osteoporosis -side effects of Methimazole  including but not limited to allergic reaction, rash, bone marrow suppression, liver dysfunction and teratogenic potential -implications in pregnancy and breastfeeding -compliance and follow up needs    If you notice any symptoms of worsening fatigue, fever with sore throat, loss of appetite, yellowing of eyes, dark urine, joint pains, sores in the mouth, itchy rash, light colored stools or abdominal pain, please stop the medication and call us  immediately as this can be a serious side effect of the medication.  I have reviewed current medications, nurse's notes, allergies, vital signs, past medical and surgical history, family medical history, and social history for this encounter. Counseled patient on symptoms, examination findings, lab findings, imaging results, treatment decisions and monitoring and prognosis. The patient understood the recommendations  and agrees with the treatment plan. All questions regarding treatment plan were fully answered.   Return in about 3 months (around 08/07/2023) for visit + labs before next visit.   [name], MD  05/08/23   I have reviewed current medications, nurse's notes, allergies, vital signs, past medical and surgical history, family medical history, and social history for this encounter. Counseled patient on symptoms, examination findings, lab findings, imaging results, treatment decisions and monitoring and prognosis. The patient understood the recommendations and agrees with the treatment plan. All questions regarding treatment plan were fully answered.   History of Present Illness Kathleen Church is a 63 y.o. year old female who presents to our clinic with Grave's diagnosed in 2025.    Diagnosed of endometrial cancer pending hysterectomy.   Symptoms suggestive of HYPOTHYROIDISM:  fatigue Yes weight gain No cold intolerance  Yes constipation  No  Symptoms suggestive of HYPERTHYROIDISM:  weight loss  No heat intolerance No hyperdefecation  No palpitations  Yes  Compressive symptoms:  dysphagia  No dysphonia  No positional dyspnea (especially with simultaneous arms elevation)  No  Smokes  No On biotin  No Personal history of head/neck surgery/irradiation  No  Adverse Drug Effects from Methimazole  (MMI): rash No fever No throat pain No arthritis No mouth ulcers No jaundice No loss of appetite No lymphadenopathy No  Grave's Ophthalmopathy Clinical Activity Score: 0/9  Physical Exam  BP 116/80   Pulse 81   Ht 5' 8.19" (1.732 m)   Wt 256 lb (116.1 kg)   SpO2 97%   BMI 38.71 kg/m  Constitutional: well developed, well nourished Head: normocephalic, atraumatic,  no exophthalmos Eyes: sclera anicteric, no redness Neck: no thyromegaly, no thyroid  tenderness; no nodules palpated Lungs: normal respiratory effort Neurology: alert and oriented, no fine hand tremor Skin:  dry, no appreciable rashes Musculoskeletal: no appreciable defects Psychiatric: normal mood and affect  Allergies Allergies  Allergen Reactions   Codeine Palpitations and Rash    Current Medications Patient's Medications  New Prescriptions   No medications on file  Previous Medications   ALBUTEROL  (VENTOLIN  HFA) 108 (90 BASE) MCG/ACT INHALER    TAKE 2 PUFFS BY MOUTH EVERY 6 HOURS AS NEEDED FOR WHEEZE OR SHORTNESS OF BREATH   BUDESONIDE  (PULMICORT  FLEXHALER) 180 MCG/ACT INHALER    Inhale 2 puffs into the lungs in the morning and at bedtime.   FENOFIBRATE  (TRICOR ) 145 MG TABLET    TAKE 1 TABLET BY MOUTH EVERY DAY   HYDROCODONE -ACETAMINOPHEN  (NORCO/VICODIN) 5-325 MG TABLET    Take 1 tablet by mouth every 4 (four) hours as needed for moderate pain (pain score 4-6). For AFTER surgery only, do not take and drive, monitor your tylenol  intake   METOPROLOL  SUCCINATE (TOPROL -XL) 100 MG 24 HR TABLET    Take 100 mg by mouth in the morning and at bedtime. Take with or immediately following a meal.   MONTELUKAST  (SINGULAIR ) 10 MG TABLET    Take 1 tablet (10 mg total) by mouth at bedtime.   MULTIPLE VITAMIN (MULTIVITAMIN WITH MINERALS) TABS TABLET    Take 1 tablet by mouth in the morning. Centrum Silver   RIVAROXABAN (XARELTO) 20 MG TABS TABLET    Take 20 mg by mouth in the morning.   SENNA-DOCUSATE (SENOKOT-S) 8.6-50 MG TABLET    Take 2 tablets by mouth at bedtime. For AFTER surgery, do not take if having diarrhea   VALSARTAN -HYDROCHLOROTHIAZIDE  (DIOVAN -HCT) 160-12.5 MG TABLET    TAKE 1 TABLET BY MOUTH EVERY DAY  Modified Medications   Modified Medication Previous Medication   METHIMAZOLE  (TAPAZOLE ) 10 MG TABLET methimazole  (TAPAZOLE ) 10 MG tablet      Take 1 tablet (10 mg total) by mouth daily.    Take 1 tablet (10 mg total) by mouth daily.  Discontinued Medications   No medications on file    Past Medical History Past Medical History:  Diagnosis Date   Allergic state    Anxiety    when  father passed   Arthritis    Asthma, mild persistent    allergy induced asthma    Cervical cancer screening 04/27/2011   Edema 04/27/2011   Graves disease 02/2023   Heart murmur    as an infant    Hyperlipidemia 04/27/2011   Hypertension 2010   Hypokalemia 04/27/2011   Measles as a child   Mumps as a child   Muscle cramp 10/14/2015   Obesity    Osteoarthritis of left knee 10/14/2015   Plantar fasciitis, left 01/19/2013   Preventative health care 02/13/2011   Sinusitis 02/13/2011    Past Surgical History Past Surgical History:  Procedure Laterality Date   ENDOMETRIAL ABLATION  2006   menorraghia   KNEE SURGERY  2006   left knee   TONSILLECTOMY     TOTAL KNEE ARTHROPLASTY Left 10/15/2015   Procedure: LEFT TOTAL KNEE ARTHROPLASTY;  Surgeon: Genevie Kerns, MD;  Location: [facility];  Service: Orthopedics;  Laterality: Left;   TUBAL LIGATION      Family History family history includes Asthma in her daughter and daughter; Breast cancer in her paternal grandmother; Cancer in her father, maternal grandmother, and paternal grandmother;  Cancer (age of onset: 45) in her mother; Factor V Leiden deficiency in her maternal aunt and maternal grandmother; Heart attack in her paternal grandfather; Stroke in her mother.  Social History Social History   Socioeconomic History   Marital status: Widowed    Spouse name: Not on file   Number of children: Not on file   Years of education: Not on file   Highest education level: Not on file  Occupational History   Not on file  Tobacco Use   Smoking status: Never   Smokeless tobacco: Never  Vaping Use   Vaping status: Never Used  Substance and Sexual Activity   Alcohol use: Yes    Comment: occas    Drug use: No   Sexual activity: Not Currently    Partners: Male  Other Topics Concern   Not on file  Social History Narrative   Not on file   Social Drivers of Health   Financial Resource Strain: Low Risk  (09/04/2019)   Received from  [facility]   Overall Financial Resource Strain (CARDIA)    Difficulty of Paying Living Expenses: Not very hard  Food Insecurity: No Food Insecurity (01/13/2021)   [facility] : [facility]   Hunger Vital Sign    Worried About Running Out of Food in the Last Year: Never true    Ran Out of Food in the Last Year: Never true  Transportation Needs: No Transportation Needs (05/03/2023)   PRAPARE - Administrator, Civil Service (Medical): No    Lack of Transportation (Non-Medical): No  Physical Activity: Not on file  Stress: No Stress Concern Present (10/08/2019)   Received from [facility] of Occupational Health - Occupational Stress Questionnaire    Feeling of Stress : Only a little  Social Connections: Unknown (05/22/2021)   Received from [facility]   Social Network    Social Network: Not on file  Intimate Partner Violence: Not At Risk (05/03/2023)   Humiliation, Afraid, Rape, and Kick questionnaire    Fear of Current or Ex-Partner: No    Emotionally Abused: No    Physically Abused: No    Sexually Abused: No    Laboratory Investigations Lab Results  Component Value Date   TSH 0.36 (L) 05/03/2023   TSH 0.02 (L) 03/16/2023   TSH <0.01 Repeated and verified X2. (L) 01/25/2023   FREET4 1.2 05/03/2023   FREET4 1.2 03/16/2023   FREET4 2.30 (H) 01/26/2023     Lab Results  Component Value Date   TSI 389 (H) 03/16/2023     No components found for: "TRAB"   Lab Results  Component Value Date   CHOL 148 01/25/2023   Lab Results  Component Value Date   HDL 38.60 (L) 01/25/2023   Lab Results  Component Value Date   LDLCALC 80 01/25/2023   Lab Results  Component Value Date   TRIG 144.0 01/25/2023   Lab Results  Component Value Date   CHOLHDL 4 01/25/2023   Lab Results  Component Value Date   CREATININE 0.54 01/26/2023   Lab Results  Component Value Date   GFR 97.74  01/26/2023      Component Value Date/Time   NA 143 01/26/2023 0816   K 3.8 01/26/2023 0816   CL 105 01/26/2023 0816   CO2 25 01/26/2023 0816   GLUCOSE 86 01/26/2023 0816   BUN 17 01/26/2023 0816   CREATININE 0.54 01/26/2023 0816  CREATININE 0.57 09/24/2019 1424   CALCIUM 9.7 01/26/2023 0816   PROT 7.1 01/26/2023 0816   ALBUMIN 4.0 01/26/2023 0816   AST 45 (H) 01/26/2023 0816   ALT 52 (H) 01/26/2023 0816   ALKPHOS 74 01/26/2023 0816   BILITOT 0.5 01/26/2023 0816   GFRNONAA >60 10/16/2015 0439   GFRAA >60 10/16/2015 0439      Latest Ref Rng & Units 01/26/2023    8:16 AM 01/25/2023    9:59 AM 01/19/2022    9:25 AM  BMP  Glucose 70 - 99 mg/dL 86  86  92   BUN 6 - 23 mg/dL 17  16  17    Creatinine 0.40 - 1.20 mg/dL 4.09  8.11  9.14   Sodium 135 - 145 mEq/L 143  141  144   Potassium 3.5 - 5.1 mEq/L 3.8  3.7  3.7   Chloride 96 - 112 mEq/L 105  105  105   CO2 19 - 32 mEq/L 25  29  30    Calcium 8.4 - 10.5 mg/dL 9.7  9.6  9.5        Component Value Date/Time   WBC 9.9 01/25/2023 0959   RBC 4.52 01/25/2023 0959   HGB 13.3 01/25/2023 0959   HCT 40.8 01/25/2023 0959   PLT 402.0 (H) 01/25/2023 0959   MCV 90.2 01/25/2023 0959   MCH 31.4 10/21/2019 1147   MCHC 32.6 01/25/2023 0959   RDW 13.8 01/25/2023 0959   LYMPHSABS 2.1 01/25/2023 0959   MONOABS 0.8 01/25/2023 0959   EOSABS 0.2 01/25/2023 0959   BASOSABS 0.1 01/25/2023 0959      Parts of this note may have been dictated using voice recognition software. There may be variances in spelling and vocabulary which are unintentional. Not all errors are proofread. Please notify the [facility] if any discrepancies are noted or if the meaning of any statement is not clear.

## 2023-05-09 NOTE — Patient Instructions (Addendum)
 SURGICAL WAITING ROOM VISITATION Patients having surgery or a procedure may have no more than 2 support people in the waiting area - these visitors may rotate in the visitor waiting room.   If the patient needs to stay at the hospital during part of their recovery, the visitor guidelines for inpatient rooms apply.  PRE-OP VISITATION  Pre-op nurse will coordinate an appropriate time for 1 support person to accompany the patient in pre-op.  This support person may not rotate.  This visitor will be contacted when the time is appropriate for the visitor to come back in the pre-op area.  Please refer to the [facility] website for the visitor guidelines for Inpatients (after your surgery is over and you are in a regular room).  You are not required to quarantine at this time prior to your surgery. However, you must do this: Hand Hygiene often Do NOT share personal items Notify your provider if you are in close contact with someone who has COVID or you develop fever 100.4 or greater, new onset of sneezing, cough, sore throat, shortness of breath or body aches.  If you test positive for Covid or have been in contact with anyone that has tested positive in the last 10 days please notify you surgeon.    Your procedure is scheduled on:  Wednesday  May 16, 2023  Report to [facility] Main Entrance: [address] entrance where the [employer] is available.   Report to admitting at:  10:30   AM  Call this number if you have any questions or problems the morning of surgery [phone]  FOLLOW ANY ADDITIONAL PRE OP INSTRUCTIONS YOU RECEIVED FROM YOUR SURGEON'S OFFICE!!!  Eat a light diet the day before surgery.  Examples including soups, broths, toast, yogurt, mashed potatoes.  AVOID GAS PRODUCING FOODS. Things to avoid include carbonated beverages (fizzy beverages, sodas), raw fruits and raw vegetables (uncooked), or beans.    Do not eat food after Midnight the night prior to your  surgery/procedure.  After Midnight you may have the following liquids until  09:45 AM DAY OF SURGERY  Clear Liquid Diet Water Black Coffee (sugar ok, NO MILK/CREAM OR CREAMERS)  Tea (sugar ok, NO MILK/CREAM OR CREAMERS) regular and decaf                             Plain Jell-O  with no fruit (NO RED)                                           Fruit ices (not with fruit pulp, NO RED)                                     Popsicles (NO RED)                                                                  Juice: NO CITRUS JUICES: only apple, WHITE grape, WHITE cranberry Sports drinks like Gatorade or Powerade (NO RED)  Oral Hygiene is also important to reduce your risk of infection.        Remember - BRUSH YOUR TEETH THE MORNING OF SURGERY WITH YOUR REGULAR TOOTHPASTE  Do NOT smoke after Midnight the night before surgery.  STOP TAKING all Vitamins, Herbs and supplements 1 week before your surgery.   STOP TAKING XARELTO 2 days before your surgery. Last dose will be taken on Sunday 05-13-2023  Take ONLY these medicines the morning of surgery with A SIP OF WATER: methimazole , metoprolol , montelukast . You may use your Pulmicort  or Albuterol  inhalers in needed. (Please bring your Albuterol  inhaler with you on the day of surgery)  You may not have any metal on your body including hair pins, jewelry, and body piercing  Do not wear make-up, lotions, powders, perfumes or deodorant  Do not wear nail polish including gel and S&S, artificial / acrylic nails, or any other type of covering on natural nails including finger and toenails. If you have artificial nails, gel coating, etc., that needs to be removed by a nail salon, Please have this removed prior to surgery. Not doing so may mean that your surgery could be cancelled or delayed if the Surgeon or anesthesia staff feels like they are unable to monitor you safely.   Do not shave 48 hours prior to surgery to avoid nicks in your skin  which may contribute to postoperative infections.   Contacts, Hearing Aids, dentures or bridgework may not be worn into surgery. DENTURES WILL BE REMOVED PRIOR TO SURGERY PLEASE DO NOT APPLY "Poly grip" OR ADHESIVES!!!  You may bring a small overnight bag with you on the day of surgery, only pack items that are not valuable. [place] IS NOT RESPONSIBLE   FOR VALUABLES THAT ARE LOST OR STOLEN.   Patients discharged on the day of surgery will not be allowed to drive home.  Someone NEEDS to stay with you for the first 24 hours after anesthesia.  Do not bring your home medications to the hospital. The Pharmacy will dispense medications listed on your medication list to you during your admission in the Hospital.  Special Instructions: Bring a copy of your healthcare power of attorney and living will documents the day of surgery, if you wish to have them scanned into your [facility] Records- EPIC  Please read over the following fact sheets you were given: IF YOU HAVE QUESTIONS ABOUT YOUR PRE-OP INSTRUCTIONS, PLEASE CALL [phone]   [facility] - Preparing for Surgery Before surgery, you can play an important role.  Because skin is not sterile, your skin needs to be as free of germs as possible.  You can reduce the number of germs on your skin by washing with CHG (chlorahexidine gluconate) soap before surgery.  CHG is an antiseptic cleaner which kills germs and bonds with the skin to continue killing germs even after washing. Please DO NOT use if you have an allergy to CHG or antibacterial soaps.  If your skin becomes reddened/irritated stop using the CHG and inform your nurse when you arrive at Short Stay. Do not shave (including legs and underarms) for at least 48 hours prior to the first CHG shower.  You may shave your face/neck.  Please follow these instructions carefully:  1.  Shower with CHG Soap the night before surgery and the  morning of surgery.  2.  If you choose to wash your  hair, wash your hair first as usual with your normal  shampoo.  3.  After you  shampoo, rinse your hair and body thoroughly to remove the shampoo.                             4.  Use CHG as you would any other liquid soap.  You can apply chg directly to the skin and wash.  Gently with a scrungie or clean washcloth.  5.  Apply the CHG Soap to your body ONLY FROM THE NECK DOWN.   Do not use on face/ open                           Wound or open sores. Avoid contact with eyes, ears mouth and genitals (private parts).                       Wash face,  Genitals (private parts) with your normal soap.             6.  Wash thoroughly, paying special attention to the area where your  surgery  will be performed.  7.  Thoroughly rinse your body with warm water from the neck down.  8.  DO NOT shower/wash with your normal soap after using and rinsing off the CHG Soap.            9.  Pat yourself dry with a clean towel.            10.  Wear clean pajamas.            11.  Place clean sheets on your bed the night of your first shower and do not  sleep with pets.  ON THE DAY OF SURGERY : Do not apply any lotions/deodorants the morning of surgery.  Please wear clean clothes to the hospital/surgery center.     FAILURE TO FOLLOW THESE INSTRUCTIONS MAY RESULT IN THE CANCELLATION OF YOUR SURGERY  PATIENT SIGNATURE_________________________________  NURSE SIGNATURE__________________________________  ________________________________________________________________________

## 2023-05-09 NOTE — Progress Notes (Addendum)
 COVID Vaccine received:  [x]  No []  Yes Date of any COVID positive Test in last 90 days:  none  PCP - Randie Bustle, MD  Cardiologist - Fransisca Jabs, MD  [facility] [place]-  [phone] (Work)  [phone] (Fax)  Endocrinology- Dennie Fitch, MD  Chest x-ray - 09-24-2019  2v  Epic EKG -  08-05-2015  Epic    04-17-2023 at [employer], will request tracing.  Stress Test - 08-13-2015  Epic ECHO - 11-04-2014  Epic Cardiac Cath -   Bowel Prep - []  No  [x]   Yes __GYN Diet_  Pacemaker / ICD device [x]  No []  Yes   Spinal Cord Stimulator:[x]  No []  Yes       History of Sleep Apnea? [x]  No []  Yes   CPAP used?- [x]  No []  Yes    Does the patient monitor blood sugar?   [x]  N/A   []  No []  Yes  Patient has: [x]  NO Hx DM   []  Pre-DM   []  DM1  []   DM2  Blood Thinner / Instructions:  Xarelto  1 po q am,  Hold x 2 days Aspirin  Instructions:  none  ERAS Protocol Ordered: []  No  [x]  Yes PRE-SURGERY []  ENSURE  []  G2   [x]  No Drink Ordered Patient is to be NPO after: 0945  Dental hx: []  Dentures:  [x]  N/A      []  Bridge or Partial:                   []  Loose or Damaged teeth:   Comments: Patient brought in an order from Dr. Nash Bade to have T3 free, T4 free and TSH drawn with her regular preop labs. Orders are filed in her chart.   Activity level: Patient is able to climb a flight of stairs without difficulty; [x]  No CP  but would have SOB Patient can perform ADLs without assistance.   Anesthesia review: asthma, HTN, A.fib (card[facility]version 10-22-2019 at [facility]), tachycardia,  ?LFTs,  Graves disease,   Patient denies shortness of breath, fever, cough and chest pain at PAT appointment.  Patient verbalized understanding and agreement to the Pre-Surgical Instructions that were given to them at this PAT appointment. Patient was also educated of the need to review these PAT instructions again prior to her surgery.I reviewed the appropriate phone numbers to call if they have any and questions or  concerns.

## 2023-05-09 NOTE — Telephone Encounter (Signed)
 Received endocrinology clearance

## 2023-05-10 ENCOUNTER — Other Ambulatory Visit: Payer: Self-pay

## 2023-05-10 ENCOUNTER — Encounter (HOSPITAL_COMMUNITY): Payer: Self-pay

## 2023-05-10 ENCOUNTER — Encounter (HOSPITAL_COMMUNITY)
Admission: RE | Admit: 2023-05-10 | Discharge: 2023-05-10 | Disposition: A | Discharge status: Home / Self Care | Source: Ambulatory Visit | Attending: Gynecologic Oncology | Admitting: Gynecologic Oncology

## 2023-05-10 VITALS — BP 138/74 | HR 82 | Temp 97.9°F | Resp 20 | Ht 68.0 in | Wt 257.0 lb

## 2023-05-10 DIAGNOSIS — Z01812 Encounter for preprocedural laboratory examination: Secondary | ICD-10-CM | POA: Diagnosis present

## 2023-05-10 DIAGNOSIS — E0500 Thyrotoxicosis with diffuse goiter without thyrotoxic crisis or storm: Secondary | ICD-10-CM | POA: Insufficient documentation

## 2023-05-10 DIAGNOSIS — I4891 Unspecified atrial fibrillation: Secondary | ICD-10-CM | POA: Diagnosis not present

## 2023-05-10 DIAGNOSIS — C541 Malignant neoplasm of endometrium: Secondary | ICD-10-CM | POA: Insufficient documentation

## 2023-05-10 HISTORY — DX: Cardiac arrhythmia, unspecified: I49.9

## 2023-05-10 HISTORY — DX: Other specified postprocedural states: R11.2

## 2023-05-10 HISTORY — DX: Malignant (primary) neoplasm, unspecified: C80.1

## 2023-05-10 HISTORY — DX: Other specified postprocedural states: Z98.890

## 2023-05-10 LAB — CBC
HCT: 43.2 % (ref 36.0–46.0)
Hemoglobin: 13.5 g/dL (ref 12.0–15.0)
MCH: 29.8 pg (ref 26.0–34.0)
MCHC: 31.3 g/dL (ref 30.0–36.0)
MCV: 95.4 fL (ref 80.0–100.0)
Platelets: 358 10*3/uL (ref 150–400)
RBC: 4.53 MIL/uL (ref 3.87–5.11)
RDW: 13.3 % (ref 11.5–15.5)
WBC: 8.1 10*3/uL (ref 4.0–10.5)
nRBC: 0 % (ref 0.0–0.2)

## 2023-05-10 LAB — COMPREHENSIVE METABOLIC PANEL WITH GFR
ALT: 21 U/L (ref 0–44)
AST: 23 U/L (ref 15–41)
Albumin: 3.6 g/dL (ref 3.5–5.0)
Alkaline Phosphatase: 74 U/L (ref 38–126)
Anion gap: 10 (ref 5–15)
BUN: 17 mg/dL (ref 8–23)
CO2: 26 mmol/L (ref 22–32)
Calcium: 9.2 mg/dL (ref 8.9–10.3)
Chloride: 103 mmol/L (ref 98–111)
Creatinine, Ser: 0.58 mg/dL (ref 0.44–1.00)
GFR, Estimated: >60 mL/min (ref >60)
Glucose, Bld: 102 mg/dL — ABNORMAL HIGH (ref 70–99)
Potassium: 3.5 mmol/L (ref 3.5–5.1)
Sodium: 139 mmol/L (ref 135–145)
Total Bilirubin: 0.7 mg/dL (ref 0.0–1.2)
Total Protein: 7.2 g/dL (ref 6.5–8.1)

## 2023-05-10 LAB — T4, FREE: Free T4: 0.94 ng/dL (ref 0.61–1.12)

## 2023-05-10 LAB — TSH: TSH: 0.621 u[IU]/mL (ref 0.350–4.500)

## 2023-05-11 ENCOUNTER — Telehealth: Payer: Self-pay

## 2023-05-11 ENCOUNTER — Telehealth: Payer: Self-pay | Admitting: *Deleted

## 2023-05-11 LAB — T3, FREE: T3, Free: 3.6 pg/mL (ref 2.0–4.4)

## 2023-05-11 NOTE — Telephone Encounter (Signed)
 Surgical clearance received and placed in providers yellow folder.

## 2023-05-11 NOTE — Telephone Encounter (Signed)
 Copied from CRM [id number]. Topic: General - Other >> May 11, 2023 11:54 AM Adonis Hoot wrote: Reason for CRM: Gyn oncology Dr Orvil Bland called to see if we received surgical clearance for that was faxed over on 05/04/2023. If so ,they would like to know if it could be completed and faxed back as soon as possible.Patient is schedule for surgery on Wednesday.  Elijah Guadalajara do you recall a surgical clearance form?

## 2023-05-11 NOTE — Telephone Encounter (Signed)
 Called Dr. Orvil Bland office and spoke with Carolyn Cisco she will refax over to us .

## 2023-05-11 NOTE — Telephone Encounter (Signed)
 Spoke with Kathleen Church and advised patient to hold her xarelto for two days prior to her procedure on May 7th with Dr. Orvil Bland. These recommendations are from patient's cardiologist and her last day to take her xarelto prior to procedure will be Sunday, May 4th. Pt verbalized understanding and thanked the office for calling.

## 2023-05-11 NOTE — Telephone Encounter (Signed)
 FMLA forms received from [place], requested information provided and faxed.   FMLA forms for daughter, Kathleen Church, (intermittent leave) also filled out and emailed to her.

## 2023-05-11 NOTE — Progress Notes (Signed)
 Anesthesia Chart Review   Case: [id number] Date/Time: 05/16/23 1230   Procedures:      HYSTERECTOMY, TOTAL, ROBOT-ASSISTED, LAPAROSCOPIC, WITH BILATERAL SALPINGO-OOPHORECTOMY     INJECTION, FOR SENTINEL LYMPH NODE IDENTIFICATION     LYMPH NODE BIOPSY     LYMPHADENECTOMY, PELVIS, ROBOT-ASSISTED   Anesthesia type: General   Diagnosis: Endometrial cancer (HCC) [C54.1]   Pre-op diagnosis: ENDOMETRIAL CANCER   Location: [facility] ROOM 05 / [facility]   Surgeons: Suzi Essex, MD       DISCUSSION:63 y.o. never smoker with h/o PONV, a-fib, Grave's disease, endometrial cancer scheduled for above procedure 05/16/23 with Dr. Wiley Hanger.   Pt last seen by cardiology 04/17/2023. Stable at this visit.   Echo 09/08/2019 Left Ventricle: Systolic function is low normal. EF: 50-55%.    Mitral Valve: There is mild regurgitation.    Tricuspid Valve: There is trace regurgitation.    Pericardium: The pericardium has a fat pad.   Pt will hold Xarelto 2 days prior to procedure.  VS: BP 138/74 Comment: right arm sitting  Pulse 82   Temp 36.6 C (Oral)   Resp 20   Ht 5\' 8"  (1.727 m)   Wt 116.6 kg   SpO2 97%   BMI 39.08 kg/m   PROVIDERS: Neda Balk, MD is PCP   Lethea Ravel Lavaughn Portland, MD is Cardiologist  LABS: Labs reviewed: Acceptable for surgery. (all labs ordered are listed, but only abnormal results are displayed)  Labs Reviewed  COMPREHENSIVE METABOLIC PANEL WITH GFR - Abnormal; Notable for the following components:      Result Value   Glucose, Bld 102 (*)    All other components within normal limits  CBC  T3, FREE  T4, FREE  TSH  TYPE AND SCREEN     IMAGES:   EKG:   CV:  Past Medical History:  Diagnosis Date   Allergic state    Anxiety    when father passed   Arthritis    Asthma, mild persistent    allergy induced asthma    Cancer (HCC)    endometrial cancer   Cervical cancer screening 04/27/2011   Dysrhythmia    A. fib   Edema 04/27/2011   Graves disease  02/2023   Heart murmur    as an infant    Hyperlipidemia 04/27/2011   Hypertension 2010   Hypokalemia 04/27/2011   Measles as a child   Mumps as a child   Muscle cramp 10/14/2015   Obesity    Osteoarthritis of left knee 10/14/2015   Plantar fasciitis, left 01/19/2013   PONV (postoperative nausea and vomiting)    Preventative health care 02/13/2011   Sinusitis 02/13/2011    Past Surgical History:  Procedure Laterality Date   ENDOMETRIAL ABLATION  01/10/2004   menorraghia   KNEE SURGERY Left 01/10/2004   left knee  arthroscopy   TONSILLECTOMY  1978   TOTAL KNEE ARTHROPLASTY Left 10/15/2015   Procedure: LEFT TOTAL KNEE ARTHROPLASTY;  Surgeon: Genevie Kerns, MD;  Location: [facility];  Service: Orthopedics;  Laterality: Left;   TUBAL LIGATION      MEDICATIONS:  albuterol  (VENTOLIN  HFA) 108 (90 Base) MCG/ACT inhaler   budesonide  (PULMICORT  FLEXHALER) 180 MCG/ACT inhaler   fenofibrate  (TRICOR ) 145 MG tablet   HYDROcodone -acetaminophen  (NORCO/VICODIN) 5-325 MG tablet   methimazole  (TAPAZOLE ) 10 MG tablet   metoprolol  succinate (TOPROL -XL) 100 MG 24 hr tablet   montelukast  (SINGULAIR ) 10 MG tablet   Multiple Vitamin (MULTIVITAMIN WITH MINERALS) TABS tablet  rivaroxaban (XARELTO) 20 MG TABS tablet   senna-docusate (SENOKOT-S) 8.6-50 MG tablet   valsartan -hydrochlorothiazide  (DIOVAN -HCT) 160-12.5 MG tablet   No current facility-administered medications for this encounter.     Chick Cotton Ward, PA-C [facility] [phone]

## 2023-05-14 NOTE — Telephone Encounter (Signed)
 Surgical clearance faxed successfully to Gynecologic oncology to [phone].

## 2023-05-15 ENCOUNTER — Telehealth: Payer: Self-pay

## 2023-05-15 NOTE — Telephone Encounter (Signed)
 Telephone call to check on pre-operative status.  Patient compliant with pre-operative instructions.  Reinforced nothing to eat after midnight. Clear liquids until 0930. Patient to arrive at 10:30.  No questions or concerns voiced.  Instructed to call for any needs.  Pt states last dose of Xarelto was Sunday 5/4

## 2023-05-15 NOTE — Discharge Instructions (Signed)
 AFTER SURGERY INSTRUCTIONS   Return to work: 4-6 weeks if applicable  Starting the morning of May 18, 2023 Friday, plan to start taking Xarelto blood thinner 10 mg (10 mg instead of your usual 20 mg) once daily for 7 days. After 7 days, if you are having no bleeding, you can increase the dose back to your usual 20 mg once daily.   Today we took a biopsy of one of the dark (black-like) areas on your vulva. When you use the bathroom, pat dry and can rinse with water if this area burns.   Activity: 1. Be up and out of the bed during the day.  Take a nap if needed.  You may walk up steps but be careful and use the hand rail.  Stair climbing will tire you more than you think, you may need to stop part way and rest.    2. No lifting or straining for 6 weeks over 10 pounds. No pushing, pulling, straining for 6 weeks.   3. No driving for 7-56 days when the following criteria have been met: Do not drive if you are taking narcotic pain medicine and make sure that your reaction time has returned.    4. You can shower as soon as the next day after surgery. Shower daily.  Use your regular soap and water (not directly on the incision) and pat your incision(s) dry afterwards; don't rub.  No tub baths or submerging your body in water until cleared by your surgeon. If you have the soap that was given to you by pre-surgical testing that was used before surgery, you do not need to use it afterwards because this can irritate your incisions.    5. No sexual activity and nothing in the vagina for 10-12 weeks.   6. You may experience a small amount of clear drainage from your incisions, which is normal.  If the drainage persists, increases, or changes color please call the office.   7. Do not use creams, lotions, or ointments such as neosporin on your incisions after surgery until advised by your surgeon because they can cause removal of the dermabond glue on your incisions.     8. You may experience vaginal spotting  after surgery or when the stitches at the top of the vagina begin to dissolve.  The spotting is normal but if you experience heavy bleeding, call our office.   9. Take Tylenol  first for pain if you are able to take these medication and only use narcotic pain medication for severe pain not relieved by the Tylenol .  Monitor your Tylenol  intake to a max of 4,000 mg in a 24 hour period.    Diet: 1. Low sodium Heart Healthy Diet is recommended but you are cleared to resume your normal (before surgery) diet after your procedure.   2. It is safe to use a laxative, such as Miralax  or Colace, if you have difficulty moving your bowels before surgery. You have been prescribed Sennakot-S to take at bedtime every evening after surgery to keep bowel movements regular and to prevent constipation.     Wound Care: 1. Keep clean and dry.  Shower daily.   Reasons to call the Doctor: Fever - Oral temperature greater than 100.4 degrees Fahrenheit Foul-smelling vaginal discharge Difficulty urinating Nausea and vomiting Increased pain at the site of the incision that is unrelieved with pain medicine. Difficulty breathing with or without chest pain New calf pain especially if only on one side Sudden, continuing increased  vaginal bleeding with or without clots.   Contacts: For questions or concerns you should contact:   Dr. Wiley Hanger at [phone]   Vira Grieves, NP at [phone]   After Hours: call [phone] and have the GYN Oncologist paged/contacted (after 5 pm or on the weekends). You will speak with an after hours RN and let he or she know you have had surgery.   Messages sent via mychart are for non-urgent matters and are not responded to after hours so for urgent needs, please call the after hours number.

## 2023-05-16 ENCOUNTER — Encounter (HOSPITAL_COMMUNITY)
Admission: RE | Disposition: A | Discharge status: Home / Self Care | Payer: Self-pay | Source: Ambulatory Visit | Attending: Gynecologic Oncology

## 2023-05-16 ENCOUNTER — Ambulatory Visit (HOSPITAL_COMMUNITY): Admitting: Physician Assistant

## 2023-05-16 ENCOUNTER — Ambulatory Visit (HOSPITAL_COMMUNITY)
Admission: RE | Admit: 2023-05-16 | Discharge: 2023-05-16 | Disposition: A | Discharge status: Home / Self Care | Source: Ambulatory Visit | Attending: Gynecologic Oncology | Admitting: Gynecologic Oncology

## 2023-05-16 ENCOUNTER — Ambulatory Visit (HOSPITAL_BASED_OUTPATIENT_CLINIC_OR_DEPARTMENT_OTHER): Admitting: Anesthesiology

## 2023-05-16 ENCOUNTER — Other Ambulatory Visit: Payer: Self-pay

## 2023-05-16 ENCOUNTER — Encounter (HOSPITAL_COMMUNITY): Payer: Self-pay | Admitting: Gynecologic Oncology

## 2023-05-16 DIAGNOSIS — N888 Other specified noninflammatory disorders of cervix uteri: Secondary | ICD-10-CM | POA: Diagnosis not present

## 2023-05-16 DIAGNOSIS — J45909 Unspecified asthma, uncomplicated: Secondary | ICD-10-CM | POA: Diagnosis not present

## 2023-05-16 DIAGNOSIS — N8003 Adenomyosis of the uterus: Secondary | ICD-10-CM | POA: Diagnosis not present

## 2023-05-16 DIAGNOSIS — M1990 Unspecified osteoarthritis, unspecified site: Secondary | ICD-10-CM | POA: Diagnosis not present

## 2023-05-16 DIAGNOSIS — J4530 Mild persistent asthma, uncomplicated: Secondary | ICD-10-CM | POA: Diagnosis not present

## 2023-05-16 DIAGNOSIS — I10 Essential (primary) hypertension: Secondary | ICD-10-CM | POA: Diagnosis not present

## 2023-05-16 DIAGNOSIS — Z7901 Long term (current) use of anticoagulants: Secondary | ICD-10-CM | POA: Insufficient documentation

## 2023-05-16 DIAGNOSIS — D1801 Hemangioma of skin and subcutaneous tissue: Secondary | ICD-10-CM | POA: Diagnosis not present

## 2023-05-16 DIAGNOSIS — Z6838 Body mass index (BMI) 38.0-38.9, adult: Secondary | ICD-10-CM

## 2023-05-16 DIAGNOSIS — C541 Malignant neoplasm of endometrium: Secondary | ICD-10-CM

## 2023-05-16 DIAGNOSIS — I4891 Unspecified atrial fibrillation: Secondary | ICD-10-CM

## 2023-05-16 DIAGNOSIS — Z79899 Other long term (current) drug therapy: Secondary | ICD-10-CM | POA: Diagnosis not present

## 2023-05-16 DIAGNOSIS — D251 Intramural leiomyoma of uterus: Secondary | ICD-10-CM | POA: Diagnosis not present

## 2023-05-16 DIAGNOSIS — N9089 Other specified noninflammatory disorders of vulva and perineum: Secondary | ICD-10-CM

## 2023-05-16 DIAGNOSIS — E669 Obesity, unspecified: Secondary | ICD-10-CM | POA: Insufficient documentation

## 2023-05-16 DIAGNOSIS — E0500 Thyrotoxicosis with diffuse goiter without thyrotoxic crisis or storm: Secondary | ICD-10-CM | POA: Diagnosis not present

## 2023-05-16 DIAGNOSIS — I480 Paroxysmal atrial fibrillation: Secondary | ICD-10-CM | POA: Diagnosis not present

## 2023-05-16 DIAGNOSIS — Z8542 Personal history of malignant neoplasm of other parts of uterus: Secondary | ICD-10-CM | POA: Diagnosis present

## 2023-05-16 HISTORY — PX: LYMPH NODE BIOPSY: SHX201

## 2023-05-16 HISTORY — PX: CYSTOSCOPY: SHX5120

## 2023-05-16 HISTORY — PX: INJECTION, FOR SENTINEL LYMPH NODE IDENTIFICATION: SHX7598

## 2023-05-16 HISTORY — PX: ROBOTIC ASSISTED TOTAL HYSTERECTOMY WITH BILATERAL SALPINGO OOPHERECTOMY: SHX6086

## 2023-05-16 HISTORY — PX: VULVA /PERINEUM BIOPSY: SHX319

## 2023-05-16 LAB — TYPE AND SCREEN
ABO/RH(D): A POS
Antibody Screen: NEGATIVE

## 2023-05-16 SURGERY — HYSTERECTOMY, TOTAL, ROBOT-ASSISTED, LAPAROSCOPIC, WITH BILATERAL SALPINGO-OOPHORECTOMY
Anesthesia: General | Site: Uterus

## 2023-05-16 MED ORDER — HYDROMORPHONE HCL 2 MG/ML IJ SOLN
INTRAMUSCULAR | Status: AC
  Filled 2023-05-16: qty 1

## 2023-05-16 MED ORDER — KETAMINE HCL 50 MG/5ML IJ SOSY
PREFILLED_SYRINGE | INTRAMUSCULAR | Status: AC
  Filled 2023-05-16: qty 5

## 2023-05-16 MED ORDER — ROCURONIUM BROMIDE 10 MG/ML (PF) SYRINGE
PREFILLED_SYRINGE | INTRAVENOUS | Status: AC
  Filled 2023-05-16: qty 10

## 2023-05-16 MED ORDER — HEPARIN SODIUM (PORCINE) 5000 UNIT/ML IJ SOLN
5000.0000 [IU] | INTRAMUSCULAR | Status: AC
  Administered 2023-05-16: 5000 [IU] via SUBCUTANEOUS
  Filled 2023-05-16: qty 1

## 2023-05-16 MED ORDER — KETAMINE HCL 10 MG/ML IJ SOLN
INTRAMUSCULAR | Status: DC | PRN
Start: 2023-05-16 — End: 2023-05-16
  Administered 2023-05-16: 10 mg via INTRAVENOUS
  Administered 2023-05-16: 20 mg via INTRAVENOUS

## 2023-05-16 MED ORDER — ROCURONIUM BROMIDE 100 MG/10ML IV SOLN
INTRAVENOUS | Status: DC | PRN
  Administered 2023-05-16 (×2): 10 mg via INTRAVENOUS
  Administered 2023-05-16 (×2): 20 mg via INTRAVENOUS

## 2023-05-16 MED ORDER — ONDANSETRON HCL 4 MG/2ML IJ SOLN
INTRAMUSCULAR | Status: DC | PRN
  Administered 2023-05-16 (×2): 4 mg via INTRAVENOUS

## 2023-05-16 MED ORDER — LIDOCAINE HCL (PF) 2 % IJ SOLN
INTRAMUSCULAR | Status: AC
  Filled 2023-05-16: qty 5

## 2023-05-16 MED ORDER — PHENYLEPHRINE HCL (PRESSORS) 10 MG/ML IV SOLN
INTRAVENOUS | Status: AC
  Filled 2023-05-16: qty 1

## 2023-05-16 MED ORDER — LACTATED RINGERS IV SOLN: INTRAVENOUS | Status: DC

## 2023-05-16 MED ORDER — DEXAMETHASONE SODIUM PHOSPHATE 10 MG/ML IJ SOLN
INTRAMUSCULAR | Status: AC
  Filled 2023-05-16: qty 1

## 2023-05-16 MED ORDER — CHLORHEXIDINE GLUCONATE 0.12 % MT SOLN
15.0000 mL | Freq: Once | OROMUCOSAL | Status: AC
  Administered 2023-05-16: 15 mL via OROMUCOSAL

## 2023-05-16 MED ORDER — STERILE WATER FOR INJECTION IJ SOLN
INTRAMUSCULAR | Status: DC | PRN
  Administered 2023-05-16: 4 mL via INTRAMUSCULAR

## 2023-05-16 MED ORDER — BUPIVACAINE HCL 0.25 % IJ SOLN
INTRAMUSCULAR | Status: DC | PRN
  Administered 2023-05-16: 25 mL

## 2023-05-16 MED ORDER — ONDANSETRON HCL 4 MG/2ML IJ SOLN: 4.0000 mg | Freq: Four times a day (QID) | INTRAMUSCULAR | Status: DC | PRN

## 2023-05-16 MED ORDER — RIVAROXABAN 10 MG PO TABS: 10.0000 mg | ORAL_TABLET | Freq: Every day | ORAL | 0 refills | Status: DC

## 2023-05-16 MED ORDER — PHENYLEPHRINE 80 MCG/ML (10ML) SYRINGE FOR IV PUSH (FOR BLOOD PRESSURE SUPPORT)
PREFILLED_SYRINGE | INTRAVENOUS | Status: AC
  Filled 2023-05-16: qty 10

## 2023-05-16 MED ORDER — LACTATED RINGERS IR SOLN
Status: DC | PRN
  Administered 2023-05-16: 1000 mL

## 2023-05-16 MED ORDER — LIDOCAINE HCL (CARDIAC) PF 100 MG/5ML IV SOSY
PREFILLED_SYRINGE | INTRAVENOUS | Status: DC | PRN
  Administered 2023-05-16: 100 mg via INTRAVENOUS

## 2023-05-16 MED ORDER — SUCCINYLCHOLINE CHLORIDE 200 MG/10ML IV SOSY
PREFILLED_SYRINGE | INTRAVENOUS | Status: DC | PRN
  Administered 2023-05-16: 80 mg via INTRAVENOUS

## 2023-05-16 MED ORDER — PROPOFOL 10 MG/ML IV BOLUS
INTRAVENOUS | Status: DC | PRN
  Administered 2023-05-16: 150 mg via INTRAVENOUS

## 2023-05-16 MED ORDER — ORAL CARE MOUTH RINSE: 15.0000 mL | Freq: Once | OROMUCOSAL | Status: AC

## 2023-05-16 MED ORDER — STERILE WATER FOR IRRIGATION IR SOLN
Status: DC | PRN
  Administered 2023-05-16: 1000 mL

## 2023-05-16 MED ORDER — PHENYLEPHRINE HCL-NACL 20-0.9 MG/250ML-% IV SOLN
INTRAVENOUS | Status: DC | PRN
  Administered 2023-05-16: 30 ug/min via INTRAVENOUS

## 2023-05-16 MED ORDER — HYDROMORPHONE HCL 1 MG/ML IJ SOLN
INTRAMUSCULAR | Status: DC | PRN
  Administered 2023-05-16: .4 mg via INTRAVENOUS

## 2023-05-16 MED ORDER — LACTATED RINGERS IV SOLN: INTRAVENOUS | Status: DC | PRN

## 2023-05-16 MED ORDER — MIDAZOLAM HCL 5 MG/5ML IJ SOLN
INTRAMUSCULAR | Status: DC | PRN
  Administered 2023-05-16: 2 mg via INTRAVENOUS

## 2023-05-16 MED ORDER — DEXAMETHASONE SODIUM PHOSPHATE 10 MG/ML IJ SOLN
4.0000 mg | INTRAMUSCULAR | Status: AC
  Administered 2023-05-16: 4 mg via INTRAVENOUS

## 2023-05-16 MED ORDER — OXYCODONE HCL 5 MG/5ML PO SOLN: 5.0000 mg | Freq: Once | ORAL | Status: AC | PRN

## 2023-05-16 MED ORDER — ACETAMINOPHEN 500 MG PO TABS
1000.0000 mg | ORAL_TABLET | ORAL | Status: AC
Start: 2023-05-16 — End: 2023-05-16
  Administered 2023-05-16: 1000 mg via ORAL
  Filled 2023-05-16: qty 2

## 2023-05-16 MED ORDER — OXYCODONE HCL 5 MG PO TABS
5.0000 mg | ORAL_TABLET | Freq: Once | ORAL | Status: AC | PRN
  Administered 2023-05-16: 5 mg via ORAL

## 2023-05-16 MED ORDER — HEMOSTATIC AGENTS (NO CHARGE) OPTIME
TOPICAL | Status: DC | PRN
  Administered 2023-05-16: 1 via TOPICAL

## 2023-05-16 MED ORDER — BUPIVACAINE HCL (PF) 0.25 % IJ SOLN
INTRAMUSCULAR | Status: AC
  Filled 2023-05-16: qty 30

## 2023-05-16 MED ORDER — STERILE WATER FOR INJECTION IJ SOLN
INTRAMUSCULAR | Status: DC | PRN
  Administered 2023-05-16: 5 mL via INTRAMUSCULAR

## 2023-05-16 MED ORDER — CEFAZOLIN SODIUM-DEXTROSE 2-4 GM/100ML-% IV SOLN
2.0000 g | INTRAVENOUS | Status: AC
  Administered 2023-05-16: 2 g via INTRAVENOUS
  Filled 2023-05-16: qty 100

## 2023-05-16 MED ORDER — SUGAMMADEX SODIUM 200 MG/2ML IV SOLN
INTRAVENOUS | Status: DC | PRN
  Administered 2023-05-16 (×3): 100 mg via INTRAVENOUS

## 2023-05-16 MED ORDER — OXYCODONE HCL 5 MG PO TABS
ORAL_TABLET | ORAL | Status: AC
  Filled 2023-05-16: qty 1

## 2023-05-16 MED ORDER — FENTANYL CITRATE (PF) 100 MCG/2ML IJ SOLN
INTRAMUSCULAR | Status: AC
  Filled 2023-05-16: qty 2

## 2023-05-16 MED ORDER — FENTANYL CITRATE PF 50 MCG/ML IJ SOSY: 25.0000 ug | PREFILLED_SYRINGE | INTRAMUSCULAR | Status: DC | PRN

## 2023-05-16 MED ORDER — ONDANSETRON HCL 4 MG/2ML IJ SOLN
INTRAMUSCULAR | Status: AC
  Filled 2023-05-16: qty 2

## 2023-05-16 MED ORDER — FENTANYL CITRATE (PF) 100 MCG/2ML IJ SOLN
INTRAMUSCULAR | Status: DC | PRN
  Administered 2023-05-16 (×4): 50 ug via INTRAVENOUS

## 2023-05-16 MED ORDER — MIDAZOLAM HCL 2 MG/2ML IJ SOLN
INTRAMUSCULAR | Status: AC
Start: 2023-05-16 — End: ?
  Filled 2023-05-16: qty 2

## 2023-05-16 MED ORDER — METRONIDAZOLE 500 MG/100ML IV SOLN
500.0000 mg | INTRAVENOUS | Status: AC
  Administered 2023-05-16: 500 mg via INTRAVENOUS
  Filled 2023-05-16: qty 100

## 2023-05-16 SURGICAL SUPPLY — 68 items
APPLICATOR ARISTA FLEXITIP XL (MISCELLANEOUS) IMPLANT
APPLICATOR SURGIFLO ENDO (HEMOSTASIS) IMPLANT
BAG COUNTER SPONGE SURGICOUNT (BAG) IMPLANT
BAG LAPAROSCOPIC 12 15 PORT 16 (BASKET) IMPLANT
BLADE SURG SZ10 CARB STEEL (BLADE) IMPLANT
COVER BACK TABLE 60X90IN (DRAPES) ×3 IMPLANT
COVER TIP SHEARS 8 DVNC (MISCELLANEOUS) ×3 IMPLANT
DERMABOND ADVANCED .7 DNX12 (GAUZE/BANDAGES/DRESSINGS) ×3 IMPLANT
DRAPE ARM DVNC X/XI (DISPOSABLE) ×12 IMPLANT
DRAPE COLUMN DVNC XI (DISPOSABLE) ×3 IMPLANT
DRAPE SHEET LG 3/4 BI-LAMINATE (DRAPES) ×3 IMPLANT
DRAPE SURG IRRIG POUCH 19X23 (DRAPES) ×3 IMPLANT
DRIVER NDL MEGA SUTCUT DVNCXI (INSTRUMENTS) ×3 IMPLANT
DRIVER NDLE MEGA SUTCUT DVNCXI (INSTRUMENTS) ×3 IMPLANT
DRSG OPSITE POSTOP 4X6 (GAUZE/BANDAGES/DRESSINGS) IMPLANT
DRSG OPSITE POSTOP 4X8 (GAUZE/BANDAGES/DRESSINGS) IMPLANT
ELECT PENCIL ROCKER SW 15FT (MISCELLANEOUS) IMPLANT
ELECT REM PT RETURN 15FT ADLT (MISCELLANEOUS) ×3 IMPLANT
FORCEPS BPLR FENES DVNC XI (FORCEP) ×3 IMPLANT
FORCEPS PROGRASP DVNC XI (FORCEP) ×3 IMPLANT
GAUZE 4X4 16PLY ~~LOC~~+RFID DBL (SPONGE) ×6 IMPLANT
GLOVE BIO SURGEON STRL SZ 6 (GLOVE) ×12 IMPLANT
GLOVE BIO SURGEON STRL SZ 6.5 (GLOVE) ×3 IMPLANT
GOWN STRL REUS W/ TWL LRG LVL3 (GOWN DISPOSABLE) ×12 IMPLANT
GRASPER SUT TROCAR 14GX15 (MISCELLANEOUS) IMPLANT
HEMOSTAT ARISTA ABSORB 3G PWDR (HEMOSTASIS) IMPLANT
HOLDER FOLEY CATH W/STRAP (MISCELLANEOUS) IMPLANT
IRRIGATION SUCT STRKRFLW 2 WTP (MISCELLANEOUS) ×3 IMPLANT
KIT PROCEDURE DVNC SI (MISCELLANEOUS) IMPLANT
KIT TURNOVER KIT A (KITS) IMPLANT
LIGASURE IMPACT 36 18CM CVD LR (INSTRUMENTS) IMPLANT
MANIPULATOR ADVINCU DEL 3.0 PL (MISCELLANEOUS) IMPLANT
MANIPULATOR ADVINCU DEL 3.5 PL (MISCELLANEOUS) IMPLANT
MANIPULATOR UTERINE 4.5 ZUMI (MISCELLANEOUS) IMPLANT
NDL HYPO 21X1.5 SAFETY (NEEDLE) ×3 IMPLANT
NDL SPNL 18GX3.5 QUINCKE PK (NEEDLE) IMPLANT
NEEDLE HYPO 21X1.5 SAFETY (NEEDLE) ×3 IMPLANT
NEEDLE SPNL 18GX3.5 QUINCKE PK (NEEDLE) ×3 IMPLANT
OBTURATOR OPTICALSTD 8 DVNC (TROCAR) ×3 IMPLANT
PACK ROBOT GYN CUSTOM WL (TRAY / TRAY PROCEDURE) ×3 IMPLANT
PAD POSITIONING PINK XL (MISCELLANEOUS) ×3 IMPLANT
PORT ACCESS TROCAR AIRSEAL 12 (TROCAR) IMPLANT
SCISSORS MNPLR CVD DVNC XI (INSTRUMENTS) ×3 IMPLANT
SCRUB CHG 4% DYNA-HEX 4OZ (MISCELLANEOUS) IMPLANT
SEAL UNIV 5-12 XI (MISCELLANEOUS) ×12 IMPLANT
SET TRI-LUMEN FLTR TB AIRSEAL (TUBING) ×3 IMPLANT
SPIKE FLUID TRANSFER (MISCELLANEOUS) ×3 IMPLANT
SPONGE T-LAP 18X18 ~~LOC~~+RFID (SPONGE) IMPLANT
SURGIFLO W/THROMBIN 8M KIT (HEMOSTASIS) IMPLANT
SUT MNCRL AB 4-0 PS2 18 (SUTURE) IMPLANT
SUT PDS AB 1 TP1 96 (SUTURE) IMPLANT
SUT STRATA PDS 0 30 CT-2.5 (SUTURE) IMPLANT
SUT VIC AB 0 CT1 27XBRD ANTBC (SUTURE) IMPLANT
SUT VIC AB 2-0 CT1 TAPERPNT 27 (SUTURE) IMPLANT
SUT VIC AB 2-0 SH 27X BRD (SUTURE) IMPLANT
SUT VIC AB 4-0 PS2 18 (SUTURE) ×6 IMPLANT
SUT VICRYL 0 27 CT2 27 ABS (SUTURE) ×3 IMPLANT
SUT VLOC 180 0 9IN GS21 (SUTURE) IMPLANT
SYR 10ML LL (SYRINGE) IMPLANT
SYSTEM BAG RETRIEVAL 10MM (BASKET) IMPLANT
SYSTEM RETRIEVL 5MM INZII UNIV (BASKET) IMPLANT
SYSTEM WOUND ALEXIS 18CM MED (MISCELLANEOUS) IMPLANT
TRAP SPECIMEN MUCUS 40CC (MISCELLANEOUS) IMPLANT
TRAY FOLEY MTR SLVR 16FR STAT (SET/KITS/TRAYS/PACK) ×3 IMPLANT
TROCAR PORT AIRSEAL 5X120 (TROCAR) IMPLANT
UNDERPAD 30X36 HEAVY ABSORB (UNDERPADS AND DIAPERS) ×6 IMPLANT
WATER STERILE IRR 1000ML POUR (IV SOLUTION) ×3 IMPLANT
YANKAUER SUCT BULB TIP 10FT TU (MISCELLANEOUS) IMPLANT

## 2023-05-16 NOTE — Anesthesia Preprocedure Evaluation (Addendum)
 Anesthesia Evaluation  Patient identified by MRN, date of birth, ID band Patient awake    Reviewed: Allergy & Precautions, NPO status , Patient's Chart, lab work & pertinent test results, reviewed documented beta blocker date and time   History of Anesthesia Complications (+) PONV and history of anesthetic complications  Airway Mallampati: II  TM Distance: >3 FB Neck ROM: Full    Dental  (+) Dental Advisory Given, Teeth Intact   Pulmonary shortness of breath and with exertion, asthma , neg recent URI   breath sounds clear to auscultation       Cardiovascular hypertension, Pt. on medications and Pt. on home beta blockers + DOE  + dysrhythmias Atrial Fibrillation + Valvular Problems/Murmurs  Rhythm:Regular Rate:Normal  08/2015 nuclear stress test: Normal low risk test with normal perfusion and normal EF.   Echo 2016 - Left ventricle: The cavity size was normal. There was mild concentric hypertrophy. Systolic function was normal. The estimated ejection fraction was in the range of 60% to 65%. Wall motion was normal; there were no regional wall motion abnormalities. There was an increased relative contribution of atrial contraction to ventricular filling. Doppler parameters are consistent with abnormal left ventricular relaxation (grade 1 diastolic dysfunction).  - Mitral valve: There was trivial regurgitation.  - Pulmonic valve: There was trivial regurgitation.     Neuro/Psych  PSYCHIATRIC DISORDERS Anxiety     negative neurological ROS     GI/Hepatic negative GI ROS, Neg liver ROS,,,  Endo/Other  negative endocrine ROS    Renal/GU negative Renal ROS     Musculoskeletal  (+) Arthritis ,    Abdominal  (+) + obese  Peds  Hematology negative hematology ROS (+)   Anesthesia Other Findings   Reproductive/Obstetrics                             Lab Results  Component Value Date   WBC 8.1 05/10/2023    HGB 13.5 05/10/2023   HCT 43.2 05/10/2023   MCV 95.4 05/10/2023   PLT 358 05/10/2023   Lab Results  Component Value Date   CREATININE 0.58 05/10/2023   BUN 17 05/10/2023   NA 139 05/10/2023   K 3.5 05/10/2023   CL 103 05/10/2023   CO2 26 05/10/2023   Lab Results  Component Value Date   INR 1.03 10/08/2015    Anesthesia Physical Anesthesia Plan  ASA: 3  Anesthesia Plan: General   Post-op Pain Management: Tylenol  PO (pre-op)*   Induction: Intravenous  PONV Risk Score and Plan: 4 or greater and Ondansetron , Dexamethasone  and Treatment may vary due to age or medical condition  Airway Management Planned: Oral ETT  Additional Equipment:   Intra-op Plan:   Post-operative Plan: Extubation in OR  Informed Consent: I have reviewed the patients History and Physical, chart, labs and discussed the procedure including the risks, benefits and alternatives for the proposed anesthesia with the patient or authorized representative who has indicated his/her understanding and acceptance.     Dental advisory given  Plan Discussed with: CRNA  Anesthesia Plan Comments:         Anesthesia Quick Evaluation

## 2023-05-16 NOTE — Anesthesia Procedure Notes (Signed)
 Procedure Name: Intubation Date/Time: 05/16/2023 1:06 PM  Performed by: Norvell Beers, CRNAPre-anesthesia Checklist: Patient identified, Emergency Drugs available, Patient being monitored and Suction available Patient Re-evaluated:Patient Re-evaluated prior to induction Oxygen Delivery Method: Circle system utilized Preoxygenation: Pre-oxygenation with 100% oxygen Induction Type: IV induction Ventilation: Mask ventilation without difficulty Laryngoscope Size: Mac and 3 Grade View: Grade I Tube type: Oral Tube size: 7.5 mm Number of attempts: 1 Airway Equipment and Method: Stylet Placement Confirmation: ETT inserted through vocal cords under direct vision Secured at: 22 cm Tube secured with: Tape Dental Injury: Teeth and Oropharynx as per pre-operative assessment  Comments: Cords clear, atraumatic. Induction/meds per anesthesiologist.

## 2023-05-16 NOTE — Transfer of Care (Signed)
 Immediate Anesthesia Transfer of Care Note  Patient: Kathleen Church  Procedure(s) Performed: HYSTERECTOMY, TOTAL, ROBOT-ASSISTED, LAPAROSCOPIC, WITH BILATERAL SALPINGO-OOPHORECTOMY (Uterus) INJECTION, FOR SENTINEL LYMPH NODE IDENTIFICATION (Pelvis) LYMPH NODE BIOPSY (Pelvis) LYMPHADENECTOMY, PELVIS, ROBOT-ASSISTED (Pelvis) CYSTOSCOPY BIOPSY, VULVA  Patient Location: PACU  Anesthesia Type:General  Level of Consciousness: drowsy  Airway & Oxygen Therapy: Patient Spontanous Breathing and Patient connected to face mask oxygen  Post-op Assessment: Report given to RN and Post -op Vital signs reviewed and stable  Post vital signs: Reviewed and stable  Last Vitals:  Vitals Value Taken Time  BP 162/91   Temp 97.7   Pulse 61   Resp 23 05/16/23 1634  SpO2 100%   Vitals shown include unfiled device data.  Last Pain:  Vitals:   05/16/23 1138  TempSrc:   PainSc: 0-No pain      Patients Stated Pain Goal: 3 (05/16/23 1138)  Complications: No notable events documented.

## 2023-05-16 NOTE — Op Note (Addendum)
 OPERATIVE NOTE  Pre-operative Diagnosis: endometrial cancer grade 1  Post-operative Diagnosis: same, retroperitoneal adiposity  Operation: Robotic-assisted laparoscopic total hysterectomy with bilateral salpingoophorectomy, SLN biopsy bilaterally with limited pelvic lymphadenectomy, cystoscopy, vulvar biopsy Obesity with significant retroperitoneal adiposity and very narrow pelvis made retroperitoneal visualization limited and increased the complexity of the case and necessitated additional instrumentation for retraction. Obesity related complexity increased the duration of the procedure by 60 minutes.   Surgeon: Wiley Hanger MD  Assistant Surgeon: Vira Grieves, NP (an NP assistant was necessary for tissue manipulation, management of robotic instrumentation, retraction and positioning due to the complexity of the case and hospital policies).   Anesthesia: GET  Urine Output: 200 cc  Operative Findings: On EUA, moderately mobile uterus.  Multiple hyperpigmented/black lesions on bilateral labia, largest measuring approximately 4 mm.  On intra-abdominal entry, normal upper abdominal survey.  Normal omentum, small and large bowel.  Uterus approximately 8 cm.  Some scarring noted at the level of the cervix posteriorly, with quite prominent bilateral uterosacral ligaments.  Normal-appearing bilateral adnexa.  Mapping successful to bilateral obturator sentinel lymph nodes.  Given very narrow pelvis and significant retroperitoneal adiposity, hysterectomy had to be performed first to allow for lymph node dissection.  Even after hysterectomy, some lymphatic tissue along the external iliac veins and obturator space required removal for safe visualization to be able to dissect and remove the sentinel lymph nodes bilaterally.  No obvious adenopathy. On cystoscopy, bladder dome intact, good efflux from bilateral ureteral orifices.  Estimated Blood Loss:  100 cc      Total IV Fluids: see I&O flowsheet          Specimens: uterus, cervix, bilateral tubes and ovaries, right obturator sentinel lymph node, right pelvic lymph nodes, left obturator sentinel lymph node and surrounding lymph nodes         Complications:  None apparent; patient tolerated the procedure well.         Disposition: PACU - hemodynamically stable.  Procedure Details  The patient was seen in the Holding Room. The risks, benefits, complications, treatment options, and expected outcomes were discussed with the patient.  The patient concurred with the proposed plan, giving informed consent.  The site of surgery properly noted/marked. The patient was identified as Kathleen Church and the procedure verified as a Robotic-assisted hysterectomy with bilateral salpingo oophorectomy with SLN biopsy.   After induction of anesthesia, the patient was draped and prepped in the usual sterile manner. Patient was placed in supine position after anesthesia and draped and prepped in the usual sterile manner as follows: Her arms were tucked to her side with all appropriate precautions.  The patient was secured to the bed using padding and tape across her chest.  The patient was placed in the semi-lithotomy position in [place] stirrups.  The perineum and vagina were prepped with CHG. The patient's abdomen was prepped with ChloraPrep and she was draped after the prep had been allowed to dry for 3 minutes.  A Time Out was held and the above information confirmed.  The urethra was prepped with Betadine. Foley catheter was placed.  A sterile speculum was placed in the vagina.  The cervix was grasped with a single-tooth tenaculum. 2mg  total of ICG was injected into the cervical stroma at 2 and 9 o'clock with 1cc injected at a 1cm and 2mm depth (concentration 0.5mg /ml) in all locations. The cervix was dilated with Adriana Hopping dilators.  The ZUMI uterine manipulator with a medium colpotomizer ring was placed without difficulty.  A pneum occluder balloon was placed over the  manipulator.  OG tube placement was confirmed and to suction.   Next, a 10 mm skin incision was made 1 cm below the subcostal margin in the midclavicular line.  The 5 mm Optiview port and scope was used for direct entry.  Opening pressure was under 10 mm CO2.  The abdomen was insufflated and the findings were noted as above.   At this point and all points during the procedure, the patient's intra-abdominal pressure did not exceed 15 mmHg. Next, an 8 mm skin incision was made superior to the umbilicus and a right and left port were placed about 8 cm lateral to the robot port on the right and left side.  A fourth arm was placed on the right.  The 5 mm assist trocar was exchanged for a 12 mm airseal port. All ports were placed under direct visualization.  The patient was placed in steep Trendelenburg.  Bowel was folded away into the upper abdomen.  The robot was docked in the normal manner.  The right and left peritoneum were opened parallel to the IP ligament to open the retroperitoneal spaces bilaterally. The round ligaments were transected. The SLN mapping was performed in bilateral pelvic basins. After identifying the ureters, the para rectal and paravesical spaces were opened up entirely with careful dissection below the level of the ureters bilaterally and to the depth of the uterine artery origin in order to skeletonize the uterine "web" and ensure visualization of all parametrial channels. The para-aortic basins were carefully exposed and evaluated for isolated para-aortic SLN's.  Green lymphatic channels were noted to be within the obturator space although given difficulty with visualization in the setting of significant retroperitoneal adiposity as well as a narrow pelvis, decision was made to proceed with hysterectomy first.   The hysterectomy was started.  The ureter was again noted to be on the medial leaf of the broad ligament.  The peritoneum above the ureter was incised and stretched and the  infundibulopelvic ligament was skeletonized, cauterized and cut.  The posterior peritoneum was taken down to the level of the KOH ring.  Posteriorly, given scar tissue along the cervix, the rectovaginal septum was developed to ensure that the rectum was freed inferiorly from the cervicovaginal junction.  Attention was then turned anteriorly where the anterior peritoneum was also taken down.  The bladder flap was created to the level of the KOH ring.  The uterine artery on the right side was skeletonized, cauterized and cut in the normal manner.  A similar procedure was performed on the left.  The colpotomy was made and the uterus, cervix, bilateral ovaries and tubes were amputated and delivered through the vagina.  The specimen was sent for frozen section.  Pedicles were inspected and excellent hemostasis was achieved.    Attention was turned back to the right pelvis.  Given difficulty visualizing the obturator space still, some lymphatic tissue was removed from along the course of the right external iliac vein.  Ultimately, lymphatic channels were identified traveling to a right deep obturator sentinel lymph node.  This lymph node was removed and sent as the sentinel lymph node.  The lymph nodes that were removed from the superior aspect of the obturator space and along the external iliac vein were sent separately as right pelvic lymph nodes.  At this point, pathology called.  Tumor was noted to be less than 50% myometrial invasion but tumor size estimated to be 6+ cm.  Decision made to proceed with sentinel lymph node biopsy on the left.  Similarly, it was quite challenging to visualize the deep obturator space due to retroperitoneal adiposity.  On this side, lymphatic tissue adjacent to the external iliac vein was removed and in doing so, the lymphatic channels traveling to a deep left obturator sentinel lymph node were visualized.  This sentinel lymph node was removed attached to the other left pelvic  lymph nodes removed to aid in visualization and sent as the left obturator sentinel lymph node with surrounding lymph nodes.  The colpotomy at the vaginal cuff was closed with 0 Vicryl with a figure-of-eight at each apex and 0 V-Loc to close the midportion of the cuff in 2 layers in a running manner.  Irrigation was used and excellent hemostasis was achieved.   Cystoscopy was performed after the bladder was backfilled with 200 cc of sterile fluid and the Foley catheter removed.  Findings noted above.  Bladder was then drained.   Pelvis was irrigated again with good hemostasis noted.  Arista was placed over the surgical beds and within the pelvic lymph node basins. At this point in the procedure was completed.  Robotic instruments were removed under direct visulaization.  The robot was undocked. The fascia at the 10-12 mm port was closed with 0 Vicryl using a PMI fascial closure device under direct visualization.  The subcuticular tissue was closed with 4-0 Vicryl and the skin was closed with 4-0 Monocryl in a subcuticular manner.  Dermabond was applied.    Vulva was examined again with findings noted above. Tischler forceps were used to biopsy one of the right vulvar lesions. Monopolar electrocautery was used to achieve hemostasis. The biopsy was handed off the field and sent to pathology.   The vagina was swabbed with minimal bleeding noted.  Rectal exam was normal.  Foley catheter was removed.  All sponge, lap and needle counts were correct x  3.   The patient was transferred to the recovery room in stable condition.  Wiley Hanger, MD

## 2023-05-16 NOTE — Brief Op Note (Signed)
 05/16/2023  4:16 PM  PATIENT:  Kathleen Church  63 y.o. female  PRE-OPERATIVE DIAGNOSIS:  ENDOMETRIAL CANCER  POST-OPERATIVE DIAGNOSIS:  ENDOMETRIAL CANCER  PROCEDURE:  Procedure(s): HYSTERECTOMY, TOTAL, ROBOT-ASSISTED, LAPAROSCOPIC, WITH BILATERAL SALPINGO-OOPHORECTOMY (N/A) INJECTION, FOR SENTINEL LYMPH NODE IDENTIFICATION (N/A) LYMPH NODE BIOPSY (N/A) LYMPHADENECTOMY, PELVIS, ROBOT-ASSISTED (N/A) CYSTOSCOPY BIOPSY, VULVA  SURGEON:  Surgeons and Role:    * Suzi Essex, MD - Primary  ASSISTANTS: Vira Grieves, NP   ANESTHESIA:   general  EBL:  100 mL   BLOOD ADMINISTERED:none  DRAINS: none   LOCAL MEDICATIONS USED:  MARCAINE      SPECIMEN:  uterus, cervix, tubes and ovaries, L obturator SLN, surrounding left pelvic lymph nodes, R obturator SLN and surrounding lymph nodes  DISPOSITION OF SPECIMEN:  PATHOLOGY  COUNTS:  YES  TOURNIQUET:  * No tourniquets in log *  DICTATION: .Note written in EPIC  PLAN OF CARE: Discharge to home after PACU  PATIENT DISPOSITION:  PACU - hemodynamically stable.   Delay start of Pharmacological VTE agent (>24hrs) due to surgical blood loss or risk of bleeding: not applicable

## 2023-05-16 NOTE — Interval H&P Note (Signed)
 History and Physical Interval Note:  05/16/2023 11:25 AM  Kathleen Church  has presented today for surgery, with the diagnosis of ENDOMETRIAL CANCER.  The various methods of treatment have been discussed with the patient and family. After consideration of risks, benefits and other options for treatment, the patient has consented to  Procedure(s): HYSTERECTOMY, TOTAL, ROBOT-ASSISTED, LAPAROSCOPIC, WITH BILATERAL SALPINGO-OOPHORECTOMY (N/A) INJECTION, FOR SENTINEL LYMPH NODE IDENTIFICATION (N/A) LYMPH NODE BIOPSY (N/A) LYMPHADENECTOMY, PELVIS, ROBOT-ASSISTED (N/A) as a surgical intervention.  The patient's history has been reviewed, patient examined, no change in status, stable for surgery.  I have reviewed the patient's chart and labs.  Questions were answered to the patient's satisfaction.     Suzi Essex

## 2023-05-17 ENCOUNTER — Encounter (HOSPITAL_COMMUNITY): Payer: Self-pay | Admitting: Gynecologic Oncology

## 2023-05-17 ENCOUNTER — Telehealth: Payer: Self-pay | Admitting: *Deleted

## 2023-05-17 NOTE — Telephone Encounter (Signed)
 Spoke with Ms. Garman this morning. She states she is eating, drinking and urinating well. She has not had a BM yet and is not passing gas. She is taking senokot as prescribed and encouraged her to drink plenty of water. Also encouraged patient to ambulate, hot liquids like mint herbal tea and over the counter gas-x to help with passing gas. She denies fever or chills. Incisions are dry and intact. She rates her pain -states only soreness not pain 6/10 and it's  controlled with tylenol  only.    Reiterated to patient and her daughter that pt is to start taking xarelto 10 mg for 7 days starting tomorrow 5/9 and then go back to 20mg  dose if no signs of bleeding.    Instructed to call office with any fever, chills, purulent drainage, uncontrolled pain or any other questions or concerns. Patient verbalizes understanding.   Pt aware of post op appointments as well as the office number [phone] and after hours number [phone] to call if she has any questions or concerns

## 2023-05-18 ENCOUNTER — Ambulatory Visit: Admitting: Gynecologic Oncology

## 2023-05-18 NOTE — Anesthesia Postprocedure Evaluation (Signed)
 Anesthesia Post Note  Patient: Kathleen Church  Procedure(s) Performed: HYSTERECTOMY, TOTAL, ROBOT-ASSISTED, LAPAROSCOPIC, WITH BILATERAL SALPINGO-OOPHORECTOMY (Uterus) INJECTION, FOR SENTINEL LYMPH NODE IDENTIFICATION (Pelvis) LYMPH NODE BIOPSY (Pelvis) LYMPHADENECTOMY, PELVIS, ROBOT-ASSISTED (Pelvis) CYSTOSCOPY BIOPSY, VULVA     Patient location during evaluation: PACU Anesthesia Type: General Level of consciousness: awake and alert Pain management: pain level controlled Vital Signs Assessment: post-procedure vital signs reviewed and stable Respiratory status: spontaneous breathing, nonlabored ventilation, respiratory function stable and patient connected to nasal cannula oxygen Cardiovascular status: blood pressure returned to baseline and stable Postop Assessment: no apparent nausea or vomiting Anesthetic complications: no   No notable events documented.  Last Vitals:  Vitals:   05/16/23 1726 05/16/23 1823  BP:  (!) 157/83  Pulse: 69   Resp: 17 18  Temp:  36.5 C  SpO2: 92% 93%    Last Pain:  Vitals:   05/16/23 1900  TempSrc:   PainSc: 3                  Marcene Laskowski S

## 2023-05-18 NOTE — Telephone Encounter (Signed)
 Spoke with Kathleen Church who states she is passing gas and had a bowel movement. Pt states she is feeling well and has no concerns at this time. Pt thanked the office for calling.

## 2023-05-21 ENCOUNTER — Encounter: Payer: Self-pay | Admitting: Gynecologic Oncology

## 2023-05-21 LAB — SURGICAL PATHOLOGY

## 2023-05-23 ENCOUNTER — Encounter: Payer: Self-pay | Admitting: Gynecologic Oncology

## 2023-05-23 ENCOUNTER — Inpatient Hospital Stay: Attending: Gynecologic Oncology | Admitting: Gynecologic Oncology

## 2023-05-23 DIAGNOSIS — Z9079 Acquired absence of other genital organ(s): Secondary | ICD-10-CM

## 2023-05-23 DIAGNOSIS — Z90722 Acquired absence of ovaries, bilateral: Secondary | ICD-10-CM

## 2023-05-23 DIAGNOSIS — Z90710 Acquired absence of both cervix and uterus: Secondary | ICD-10-CM

## 2023-05-23 DIAGNOSIS — C541 Malignant neoplasm of endometrium: Secondary | ICD-10-CM

## 2023-05-23 DIAGNOSIS — Z7189 Other specified counseling: Secondary | ICD-10-CM

## 2023-05-23 NOTE — Progress Notes (Signed)
 Gynecologic Oncology Telehealth Note: Gyn-Onc  I connected with Kathleen Church on 05/23/23 at  6:00 PM EDT by telephone and verified that I am speaking with the correct person using two identifiers.  I discussed the limitations, risks, security and privacy concerns of performing an evaluation and management service by telemedicine and the availability of in-person appointments. I also discussed with the patient that there may be a patient responsible charge related to this service. The patient expressed understanding and agreed to proceed.  Other persons participating in the visit and their role in the encounter: none.  Patient's location: home Provider's location: [facility]  Reason for Visit: follow-up  Treatment History: The patient developed intermittent postmenopausal spotting/bleeding in 10/2022. Pelvic ultrasound on 01/25/2023 revealed uterus measuring 6.5 x 4.4 x 3.7 cm, with 1.9 cm intramural fibroid.  Endometrium measured 5 mm with normal echotexture.  Neither ovaries visualized. Endometrial biopsy on 4/10 revealed FIGO grade 1 endometrioid adenocarcinoma.  05/16/23: Robotic-assisted laparoscopic total hysterectomy with bilateral salpingoophorectomy, SLN biopsy bilaterally with limited pelvic lymphadenectomy, cystoscopy, vulvar biopsy   Interval History: Doing well. Denies pain, has some abdominal distention still. Some weeping (no bleeding) from vulvar biopsy site. Denies vaginal bleeding. Bowel function has normalized.  Past Medical/Surgical History: Past Medical History:  Diagnosis Date   Allergic state    Anxiety    when father passed   Arthritis    Asthma, mild persistent    allergy induced asthma    Cancer (HCC)    endometrial cancer   Cervical cancer screening 04/27/2011   Dysrhythmia    A. fib   Edema 04/27/2011   Graves disease 02/2023   Heart murmur    as an infant    Hyperlipidemia 04/27/2011   Hypertension 2010   Hypokalemia 04/27/2011   Measles as a  child   Mumps as a child   Muscle cramp 10/14/2015   Obesity    Osteoarthritis of left knee 10/14/2015   Plantar fasciitis, left 01/19/2013   PONV (postoperative nausea and vomiting)    Preventative health care 02/13/2011   Sinusitis 02/13/2011    Past Surgical History:  Procedure Laterality Date   CYSTOSCOPY  05/16/2023   Procedure: CYSTOSCOPY;  Surgeon: Suzi Essex, MD;  Location: [facility];  Service: Gynecology;;   ENDOMETRIAL ABLATION  01/10/2004   menorraghia   INJECTION, FOR SENTINEL LYMPH NODE IDENTIFICATION N/A 05/16/2023   Procedure: INJECTION, FOR SENTINEL LYMPH NODE IDENTIFICATION;  Surgeon: Suzi Essex, MD;  Location: [facility];  Service: Gynecology;  Laterality: N/A;   KNEE SURGERY Left 01/10/2004   left knee  arthroscopy   LYMPH NODE BIOPSY N/A 05/16/2023   Procedure: LYMPH NODE BIOPSY;  Surgeon: Suzi Essex, MD;  Location: [facility];  Service: Gynecology;  Laterality: N/A;   ROBOTIC ASSISTED TOTAL HYSTERECTOMY WITH BILATERAL SALPINGO OOPHERECTOMY N/A 05/16/2023   Procedure: HYSTERECTOMY, TOTAL, ROBOT-ASSISTED, LAPAROSCOPIC, WITH BILATERAL SALPINGO-OOPHORECTOMY;  Surgeon: Suzi Essex, MD;  Location: [facility];  Service: Gynecology;  Laterality: N/A;   TONSILLECTOMY  1978   TOTAL KNEE ARTHROPLASTY Left 10/15/2015   Procedure: LEFT TOTAL KNEE ARTHROPLASTY;  Surgeon: Genevie Kerns, MD;  Location: [facility];  Service: Orthopedics;  Laterality: Left;   TUBAL LIGATION     VULVA /PERINEUM BIOPSY  05/16/2023   Procedure: BIOPSY, VULVA;  Surgeon: Suzi Essex, MD;  Location: [facility];  Service: Gynecology;;    Family History  Problem Relation Age of Onset   Cancer Mother 58       metastatic  lung cancer   Stroke Mother    Cancer Father        kidney   Factor V Leiden deficiency Maternal Aunt    Cancer Maternal Grandmother        bone   Factor V Leiden deficiency Maternal Grandmother    Cancer Paternal Grandmother        breast   Breast cancer Paternal  Grandmother    Heart attack Paternal Grandfather    Asthma Daughter    Asthma Daughter    Ovarian cancer Neg Hx    Endometrial cancer Neg Hx    Pancreatic cancer Neg Hx    Colon cancer Neg Hx    Prostate cancer Neg Hx     Social History   Socioeconomic History   Marital status: Widowed    Spouse name: Not on file   Number of children: Not on file   Years of education: Not on file   Highest education level: Not on file  Occupational History   Not on file  Tobacco Use   Smoking status: Never   Smokeless tobacco: Never  Vaping Use   Vaping status: Never Used  Substance and Sexual Activity   Alcohol use: Yes    Comment: occas    Drug use: No   Sexual activity: Not Currently    Partners: Male  Other Topics Concern   Not on file  Social History Narrative   Not on file   Social Drivers of Health   Financial Resource Strain: Low Risk  (09/04/2019)   Received from [facility], [facility]   Overall Financial Resource Strain (CARDIA)    Difficulty of Paying Living Expenses: Not very hard  Food Insecurity: No Food Insecurity (05/08/2023)   Hunger Vital Sign    Worried About Running Out of Food in the Last Year: Never true    Ran Out of Food in the Last Year: Never true  Transportation Needs: No Transportation Needs (05/08/2023)   PRAPARE - Administrator, Civil Service (Medical): No    Lack of Transportation (Non-Medical): No  Physical Activity: Not on file  Stress: No Stress Concern Present (10/08/2019)   Received from [facility], [facility] of Occupational Health - Occupational Stress Questionnaire    Feeling of Stress : Only a little  Social Connections: Unknown (05/22/2021)   Received from [facility], [facility]   Social Network    Social Network: Not on file    Current Medications:  Current Outpatient Medications:    albuterol  (VENTOLIN  HFA) 108 (90 Base) MCG/ACT inhaler, TAKE 2 PUFFS BY MOUTH EVERY 6 HOURS AS NEEDED  FOR WHEEZE OR SHORTNESS OF BREATH, Disp: 18 each, Rfl: 3   budesonide  (PULMICORT  FLEXHALER) 180 MCG/ACT inhaler, Inhale 2 puffs into the lungs in the morning and at bedtime. (Patient taking differently: Inhale 2 puffs into the lungs 2 (two) times daily as needed (allergies (spring & fall months ONLY)).), Disp: 1 each, Rfl: 5   fenofibrate  (TRICOR ) 145 MG tablet, TAKE 1 TABLET BY MOUTH EVERY DAY, Disp: 90 tablet, Rfl: 1   HYDROcodone -acetaminophen  (NORCO/VICODIN) 5-325 MG tablet, Take 1 tablet by mouth every 4 (four) hours as needed for moderate pain (pain score 4-6). For AFTER surgery only, do not take and drive, monitor your tylenol  intake, Disp: 10 tablet, Rfl: 0   methimazole  (TAPAZOLE ) 10 MG tablet, Take 1 tablet (10 mg total) by mouth daily., Disp: 90 tablet, Rfl: 1   metoprolol  succinate (TOPROL -XL)  100 MG 24 hr tablet, Take 100 mg by mouth in the morning and at bedtime. Take with or immediately following a meal., Disp: , Rfl:    montelukast  (SINGULAIR ) 10 MG tablet, Take 1 tablet (10 mg total) by mouth at bedtime. (Patient taking differently: Take 10 mg by mouth in the morning.), Disp: 90 tablet, Rfl: 1   Multiple Vitamin (MULTIVITAMIN WITH MINERALS) TABS tablet, Take 1 tablet by mouth in the morning. Centrum Silver, Disp: , Rfl:    rivaroxaban  (XARELTO ) 10 MG TABS tablet, Take 1 tablet (10 mg total) by mouth daily. Plan to start taking the morning of May 9 and continue taking 10 mg once daily for 7 days after surgery., Disp: 7 tablet, Rfl: 0   rivaroxaban  (XARELTO ) 20 MG TABS tablet, Take 20 mg by mouth in the morning., Disp: , Rfl:    senna-docusate (SENOKOT-S) 8.6-50 MG tablet, Take 2 tablets by mouth at bedtime. For AFTER surgery, do not take if having diarrhea, Disp: 30 tablet, Rfl: 0   valsartan -hydrochlorothiazide  (DIOVAN -HCT) 160-12.5 MG tablet, TAKE 1 TABLET BY MOUTH EVERY DAY, Disp: 90 tablet, Rfl: 1  Review of Symptoms: Pertinent positives as per HPI.  Physical Exam: Deferred given  limitations of phone visit.  Laboratory & Radiologic Studies: A. UTERUS, CERVIX, BILATERAL FALLOPIAN TUBES AND OVARIES, RESECTION: - Endometrioid carcinoma arising in a background of inactive endometrium. - See cancer summary below. - Cervix with Nabothian cysts and tunnel clusters. - Myometrium with adenomyosis and leiomyomata, largest measuring 1.8 cm. - Bilateral ovaries, negative for malignancy. - Bilateral fallopian tubes with fimbriated end, negative for malignancy.  B. RIGHT OBTURATOR SENTINEL LYMPH NODE: - One lymph node negative for malignancy (0/1).  C. RIGHT PELVIC LYMPH NODE: - One lymph node negative for malignancy (0/1).  D. LEFT OBTURATOR SENTINEL LYMPH NODE: - Three lymph nodes negative for malignancy (0/3).  E. RIGHT VULVA, 3 O'CLOCK, BIOPSY: - Skin with benign hemangioma.   CASE SUMMARY: (ENDOMETRIUM) Standard(s): AJCC 8, FIGO 2009 Staging (2018 Annual Report), FIGO 2023 Staging  SPECIMEN Procedure: Total hysterectomy with bilateral salpingo-oophorectomy  TUMOR Histologic Type: Endometrioid carcinoma Histologic Grade: FIGO grade 1 Molecular Type:      MMR Immunohistochemistry: Will be reported in an addendum      p53 Immunohistochemistry: Will be reported in an addendum Myometrial Invasion: Present      Depth of invasion (millimeters): 7 mm      Myometrial thickness (millimeters): 21 mm      Percentage of myometrial invasion: 33% Uterine Serosa Involvement: Not identified Cervical Involvement: Not identified Other Tissue/Organ Involvement: Not identified Lymphatic and/or Vascular Invasion: Not identified  MARGINS Margin Status: Not applicable  REGIONAL LYMPH NODES Regional Lymph Node Status: All regional lymph nodes negative for tumor cells Lymph Nodes Examined:      Total Number of Pelvic Nodes Examined (sentinel and non-sentinel): 5      Number of Pelvic Sentinel Nodes Examined: 4      Total Number of Para-aortic Nodes Examined (sentinel  and non-sentinel): 0      Number of Para-aortic Sentinel Nodes Examined: 0  DISTANT METASTASIS Distant Site(s) Involved, if applicable: Not identified  PATHOLOGIC STAGE CLASSIFICATION (pTNM, AJCC 8th Edition): Modified Classification: Not applicable pT1a      T Suffix: Not applicable pN0      N Suffix: (sn) pM - Not applicable   Assessment & Plan: Kathleen Church is a 63 y.o. 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  MMRp, p53 WT.  Doing well, meeting postoperative milestones. Discussed pathology from surgery including vulvar biopsy. Discussed vulvar care. Patient very happy with news that uterine cancer was confined to the uterus. No adjuvant treatment indicated.  I discussed the assessment and treatment plan with the patient. The patient was provided with an opportunity to ask questions and all were answered. The patient agreed with the plan and demonstrated an understanding of the instructions.   The patient was advised to call back or see an in-person evaluation if the symptoms worsen or if the condition fails to improve as anticipated.   6 minutes of total time was spent for this patient encounter, including preparation, phone counseling with the patient and coordination of care, and documentation of the encounter.   Wiley Hanger, MD  Division of Gynecologic Oncology  Department of Obstetrics and Gynecology  [facility] [place]  [facility]

## 2023-05-27 ENCOUNTER — Other Ambulatory Visit: Payer: Self-pay | Admitting: Family Medicine

## 2023-06-06 ENCOUNTER — Encounter: Payer: Self-pay | Admitting: Gynecologic Oncology

## 2023-06-06 ENCOUNTER — Telehealth: Payer: Self-pay | Admitting: *Deleted

## 2023-06-06 NOTE — Telephone Encounter (Signed)
 Spoke with Ms. Macfarlane who called the office requesting a back to work note. Pt would like to return back to work on 6/2 to work from home with limitations for the remainder of her 6 weeks. Pt states she is doing well. Pt is looking forward to her post follow up appt. With Dr. Orvil Bland on 6/6 and just wants to be able to work from home for the remainder of her 6 weeks. Advised patient her message would be relayed to providers and once the office gets an ok from provider we can email her requested note to [email or web address]

## 2023-06-15 ENCOUNTER — Encounter: Payer: Self-pay | Admitting: Gynecologic Oncology

## 2023-06-15 ENCOUNTER — Inpatient Hospital Stay: Attending: Gynecologic Oncology | Admitting: Gynecologic Oncology

## 2023-06-15 VITALS — BP 135/73 | HR 74 | Temp 98.3°F | Resp 20 | Wt 260.2 lb

## 2023-06-15 DIAGNOSIS — Z7189 Other specified counseling: Secondary | ICD-10-CM

## 2023-06-15 DIAGNOSIS — C541 Malignant neoplasm of endometrium: Secondary | ICD-10-CM

## 2023-06-15 NOTE — Patient Instructions (Signed)
 It was good to see you today.  You are healing very well from surgery!  Please remember, no heavy lifting for 6 weeks after surgery and nothing in the vagina for at least 12 weeks.  I will see you back for follow-up in 6 months.  If you develop any of the symptoms we talked today about including bleeding, discharge, pelvic or abdominal pain, change to bowel or bladder function, or unintentional weight loss before your next visit, please call to see me sooner.

## 2023-06-15 NOTE — Progress Notes (Signed)
 Gynecologic Oncology Return Clinic Visit  06/15/23  Reason for Visit: treatment planning  Treatment History: The patient developed intermittent postmenopausal spotting/bleeding in 10/2022. Pelvic ultrasound on 01/25/2023 revealed uterus measuring 6.5 x 4.4 x 3.7 cm, with 1.9 cm intramural fibroid.  Endometrium measured 5 mm with normal echotexture.  Neither ovaries visualized. Endometrial biopsy on 4/10 revealed FIGO grade 1 endometrioid adenocarcinoma.   05/16/23: Robotic-assisted laparoscopic total hysterectomy with bilateral salpingoophorectomy, SLN biopsy bilaterally with limited pelvic lymphadenectomy, cystoscopy, vulvar biopsy   Interval History: Doing well.  Denies any vaginal bleeding or discharge.  Denies any abdominal pain.  Reports baseline bowel bladder function.  Past Medical/Surgical History: Past Medical History:  Diagnosis Date   Allergic state    Anxiety    when father passed   Arthritis    Asthma, mild persistent    allergy induced asthma    Cancer (HCC)    endometrial cancer   Cervical cancer screening 04/27/2011   Dysrhythmia    A. fib   Edema 04/27/2011   Graves disease 02/2023   Heart murmur    as an infant    Hyperlipidemia 04/27/2011   Hypertension 2010   Hypokalemia 04/27/2011   Measles as a child   Mumps as a child   Muscle cramp 10/14/2015   Obesity    Osteoarthritis of left knee 10/14/2015   Plantar fasciitis, left 01/19/2013   PONV (postoperative nausea and vomiting)    Preventative health care 02/13/2011   Sinusitis 02/13/2011    Past Surgical History:  Procedure Laterality Date   CYSTOSCOPY  05/16/2023   Procedure: CYSTOSCOPY;  Surgeon: Suzi Essex, MD;  [facility]: [facility];  Service: Gynecology;;   ENDOMETRIAL ABLATION  01/10/2004   menorraghia   INJECTION, FOR SENTINEL LYMPH NODE IDENTIFICATION N/A 05/16/2023   Procedure: INJECTION, FOR SENTINEL LYMPH NODE IDENTIFICATION;  Surgeon: Suzi Essex, MD;  [facility];   Service: Gynecology;  Laterality: N/A;   KNEE SURGERY Left 01/10/2004   left knee  arthroscopy   LYMPH NODE BIOPSY N/A 05/16/2023   Procedure: LYMPH NODE BIOPSY;  Surgeon: Suzi Essex, MD;  [facility];  Service: Gynecology;  Laterality: N/A;   ROBOTIC ASSISTED TOTAL HYSTERECTOMY WITH BILATERAL SALPINGO OOPHERECTOMY N/A 05/16/2023   Procedure: HYSTERECTOMY, TOTAL, ROBOT-ASSISTED, LAPAROSCOPIC, WITH BILATERAL SALPINGO-OOPHORECTOMY;  Surgeon: Suzi Essex, MD;  [facility];  Service: Gynecology;  Laterality: N/A;   TONSILLECTOMY  1978   TOTAL KNEE ARTHROPLASTY Left 10/15/2015   Procedure: LEFT TOTAL KNEE ARTHROPLASTY;  Surgeon: Genevie Kerns, MD;  [facility];  Service: Orthopedics;  Laterality: Left;   TUBAL LIGATION     VULVA /PERINEUM BIOPSY  05/16/2023   Procedure: BIOPSY, VULVA;  Surgeon: Suzi Essex, MD;  [facility];  Service: Gynecology;;    Family History  Problem Relation Age of Onset   Cancer Mother 40       metastatic lung cancer   Stroke Mother    Cancer Father        kidney   Factor V Leiden deficiency Maternal Aunt    Cancer Maternal Grandmother        bone   Factor V Leiden deficiency Maternal Grandmother    Cancer Paternal Grandmother        breast   Breast cancer Paternal Grandmother    Heart attack Paternal Grandfather    Asthma Daughter    Asthma Daughter    Ovarian cancer Neg Hx    Endometrial cancer Neg Hx  Pancreatic cancer Neg Hx    Colon cancer Neg Hx    Prostate cancer Neg Hx     Social History   Socioeconomic History   Marital status: Widowed    Spouse name: Not on file   Number of children: Not on file   Years of education: Not on file   Highest education level: Not on file  Occupational History   Not on file  Tobacco Use   Smoking status: Never   Smokeless tobacco: Never  Vaping Use   Vaping status: Never Used  Substance and Sexual Activity   Alcohol use: Yes    Comment: occas    Drug use: No    Sexual activity: Not Currently    Partners: Male  Other Topics Concern   Not on file  Social History Narrative   Not on file   Social Drivers of Health   Financial Resource Strain: Low Risk  (09/04/2019)   Received from [facility], [facility]   Overall Financial Resource Strain (CARDIA)    Difficulty of Paying Living Expenses: Not very hard  Food Insecurity: No Food Insecurity (05/08/2023)   Hunger Vital Sign    Worried About Running Out of Food in the Last Year: Never true    Ran Out of Food in the Last Year: Never true  Transportation Needs: No Transportation Needs (05/08/2023)   PRAPARE - Administrator, Civil Service (Medical): No    Lack of Transportation (Non-Medical): No  Physical Activity: Not on file  Stress: No Stress Concern Present (10/08/2019)   Received from [facility], [facility]ational [facility] - Occupational Stress Questionnaire    Feeling of Stress : Only a little  Social Connections: Unknown (05/22/2021)   Received from [facility], [facility]   Social Network    Social Network: Not on file    Current Medications:  Current Outpatient Medications:    albuterol  (VENTOLIN  HFA) 108 (90 Base) MCG/ACT inhaler, TAKE 2 PUFFS BY MOUTH EVERY 6 HOURS AS NEEDED FOR WHEEZE OR SHORTNESS OF BREATH, Disp: 18 each, Rfl: 3   budesonide  (PULMICORT  FLEXHALER) 180 MCG/ACT inhaler, Inhale 2 puffs into the lungs in the morning and at bedtime. (Patient taking differently: Inhale 2 puffs into the lungs 2 (two) times daily as needed (allergies (spring & fall months ONLY)).), Disp: 1 each, Rfl: 5   fenofibrate  (TRICOR ) 145 MG tablet, TAKE 1 TABLET BY MOUTH EVERY DAY, Disp: 90 tablet, Rfl: 1   methimazole  (TAPAZOLE ) 10 MG tablet, Take 1 tablet (10 mg total) by mouth daily., Disp: 90 tablet, Rfl: 1   metoprolol  succinate (TOPROL -XL) 100 MG 24 hr tablet, Take 100 mg by mouth in the morning and at bedtime. Take with or immediately  following a meal., Disp: , Rfl:    montelukast  (SINGULAIR ) 10 MG tablet, TAKE 1 TABLET BY MOUTH EVERYDAY AT BEDTIME, Disp: 90 tablet, Rfl: 1   Multiple Vitamin (MULTIVITAMIN WITH MINERALS) TABS tablet, Take 1 tablet by mouth in the morning. Centrum Silver, Disp: , Rfl:    rivaroxaban  (XARELTO ) 10 MG TABS tablet, Take 1 tablet (10 mg total) by mouth daily. Plan to start taking the morning of May 9 and continue taking 10 mg once daily for 7 days after surgery., Disp: 7 tablet, Rfl: 0   rivaroxaban  (XARELTO ) 20 MG TABS tablet, Take 20 mg by mouth in the morning., Disp: , Rfl:    valsartan -hydrochlorothiazide  (DIOVAN -HCT) 160-12.5 MG tablet, TAKE 1 TABLET BY MOUTH  EVERY DAY, Disp: 90 tablet, Rfl: 1  Review of Systems: Denies appetite changes, fevers, chills, fatigue, unexplained weight changes. Denies hearing loss, neck lumps or masses, mouth sores, ringing in ears or voice changes. Denies cough or wheezing.  Denies shortness of breath. Denies chest pain or palpitations. Denies leg swelling. Denies abdominal distention, pain, blood in stools, constipation, diarrhea, nausea, vomiting, or early satiety. Denies pain with intercourse, dysuria, frequency, hematuria or incontinence. Denies hot flashes, pelvic pain, vaginal bleeding or vaginal discharge.   Denies joint pain, back pain or muscle pain/cramps. Denies itching, rash, or wounds. Denies dizziness, headaches, numbness or seizures. Denies swollen lymph nodes or glands, denies easy bruising or bleeding. Denies anxiety, depression, confusion, or decreased concentration.  Physical Exam: BP 135/73   Pulse 74   Temp 98.3 F (36.8 C)   Resp 20   Wt 260 lb 3.2 oz (118 kg)   SpO2 96%   BMI 39.56 kg/m  General: Alert, oriented, no acute distress. HEENT: Posterior oropharynx clear, sclera anicteric. Chest: Unlabored breathing on room air. Abdomen: Obese, soft, nontender.  Normoactive bowel sounds.  No masses or hepatosplenomegaly appreciated.   Well-healed incisions. Extremities: Grossly normal range of motion.  Warm, well perfused.  No edema bilaterally. GU: Normal appearing external genitalia without erythema, excoriation, or lesions.  Speculum exam reveals cuff intact, suture visible, no blood or discharge.  Bimanual exam reveals cuff intact, no fluctuance or tenderness to palpation.    Laboratory & Radiologic Studies: A. UTERUS, CERVIX, BILATERAL FALLOPIAN TUBES AND OVARIES, RESECTION: - Endometrioid carcinoma arising in a background of inactive endometrium. - See cancer summary below. - Cervix with Nabothian cysts and tunnel clusters. - Myometrium with adenomyosis and leiomyomata, largest measuring 1.8 cm. - Bilateral ovaries, negative for malignancy. - Bilateral fallopian tubes with fimbriated end, negative for malignancy.  B. RIGHT OBTURATOR SENTINEL LYMPH NODE: - One lymph node negative for malignancy (0/1).  C. RIGHT PELVIC LYMPH NODE: - One lymph node negative for malignancy (0/1).  D. LEFT OBTURATOR SENTINEL LYMPH NODE: - Three lymph nodes negative for malignancy (0/3).  E. RIGHT VULVA, 3 O'CLOCK, BIOPSY: - Skin with benign hemangioma.   CASE SUMMARY: (ENDOMETRIUM) Standard(s): AJCC 8, FIGO 2009 Staging (2018 Annual Report), FIGO 2023 Staging  SPECIMEN Procedure: Total hysterectomy with bilateral salpingo-oophorectomy  TUMOR Histologic Type: Endometrioid carcinoma Histologic Grade: FIGO grade 1 Molecular Type:      MMR Immunohistochemistry: Will be reported in an addendum      p53 Immunohistochemistry: Will be reported in an addendum Myometrial Invasion: Present      Depth of invasion (millimeters): 7 mm      Myometrial thickness (millimeters): 21 mm      Percentage of myometrial invasion: 33% Uterine Serosa Involvement: Not identified Cervical Involvement: Not identified Other Tissue/Organ Involvement: Not identified Lymphatic and/or Vascular Invasion: Not identified  Assessment &  Plan: Kathleen Church is a 63 y.o. 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 MMRp, p53 WT.  Patient is doing very well after surgery.  Discussed continued expectations and restrictions.  Reviewed pathology from her procedure.  She was given a copy of this report.  Given early stage, low risk disease, discussed plan for surveillance visits every 6 months alternating between my office and her OB/GYN.  I will see her for her first visit in 6 months.  We reviewed signs and symptoms that would be concerning for possible recurrence.  I stressed the importance of calling if she develops  any of these between visits.  22 minutes of total time was spent for this patient encounter, including preparation, face-to-face counseling with the patient and coordination of care, and documentation of the encounter.  Wiley Hanger, MD  Division of Gynecologic Oncology  Department of Obstetrics and Gynecology  [facility]

## 2023-06-25 ENCOUNTER — Telehealth: Payer: Self-pay | Admitting: *Deleted

## 2023-06-25 NOTE — Telephone Encounter (Signed)
 Spoke with Ms. Malan who called the office stating she started having vaginal bleeding on Saturday, June 7th. The day after her visit with Dr. Orvil Bland. Pt states it started as just spotting. Then several times on and off she saturated a panty liner with bright red blood and one time she passed a tiny blood clot. When it's only spotting the blood is dark red/brown.   Pt denies pelvic pain, fever and or chills. Pt has been following all activity restrictions and denies heavy lifting, all strenuous activity, and nothing placed in the vagina. Pt is still taking her xarelto  20 mg daily. Advised patient that her message will be relayed to providers and to monitor the bleeding, if it increases, and is accompanied by any new symptoms to call the after hours number or for very heavy bleeding that saturates a peri pad with clots more than once to go to ED for evaluation. Pt verbalized understanding.

## 2023-06-26 ENCOUNTER — Inpatient Hospital Stay (HOSPITAL_BASED_OUTPATIENT_CLINIC_OR_DEPARTMENT_OTHER): Admitting: Gynecologic Oncology

## 2023-06-26 VITALS — BP 143/79 | HR 88 | Temp 98.2°F | Resp 18 | Ht 68.0 in | Wt 258.0 lb

## 2023-06-26 DIAGNOSIS — N939 Abnormal uterine and vaginal bleeding, unspecified: Secondary | ICD-10-CM

## 2023-06-26 NOTE — Patient Instructions (Addendum)
 No obivious signs of active bleeding on today's exam. Today we applied a medication called monsels to the top of the vagina to help slow/stop the spotting. You may have a mustard like drainage from this medication or when it mixes with blood, it can look like coffee grounds. This is normal.   Call for heavier bleeding, clots, or for any changes. If after hours, call the after hours service at [phone] and have the GYN Oncologist paged.   Continue with your restrictions of no heavy lifting or strenuous activity for another two week (total of 8 weeks), nothing in the vagina for 12 weeks.  Symptoms to report to your health care team include vaginal bleeding, rectal bleeding, bloating, weight loss without effort, new and persistent pain, new and  persistent fatigue, new leg swelling, new masses (i.e., bumps in your neck or groin), new and persistent cough, new and persistent nausea and vomiting, change in bowel or bladder habits, and any other concerns.

## 2023-06-26 NOTE — Progress Notes (Signed)
 Gynecologic Oncology Symptom Management  06/26/23  Reason for Visit: evaluation of post-op vaginal bleeding  Treatment History: The patient developed intermittent postmenopausal spotting/bleeding in 10/2022. Pelvic ultrasound on 01/25/2023 revealed uterus measuring 6.5 x 4.4 x 3.7 cm, with 1.9 cm intramural fibroid.  Endometrium measured 5 mm with normal echotexture.  Neither ovaries visualized. Endometrial biopsy on 4/10 revealed FIGO grade 1 endometrioid adenocarcinoma.   05/16/23: Robotic-assisted laparoscopic total hysterectomy with bilateral salpingoophorectomy, SLN biopsy bilaterally with limited pelvic lymphadenectomy, cystoscopy, vulvar biopsy.  She was last seen in the office by Dr. Orvil Bland on 06/15/2023 for post-operative follow up. She was noted to be healing well at this visit.    Interval History: Doing overall well. Developed vaginal bleeding the day after her post-op visit on 06/15/23. The bleeding was light initially and then she saturated a panty liner intermittently with one episode including a small clot. When spotting, the bleeding was dark red/brown and when heavier, it was bright red. She has been taking her xarelto  as prescribed. She noticed an increase in bleeding after moving around, doing more. Today, she has had spotting, sees pink when she wipes. Denies any abdominal pain.  Reports baseline bowel bladder function. No other symptoms reported.  Past Medical/Surgical History: Past Medical History:  Diagnosis Date   Allergic state    Anxiety    when father passed   Arthritis    Asthma, mild persistent    allergy induced asthma    Cancer (HCC)    endometrial cancer   Cervical cancer screening 04/27/2011   Dysrhythmia    A. fib   Edema 04/27/2011   Graves disease 02/2023   Heart murmur    as an infant    Hyperlipidemia 04/27/2011   Hypertension 2010   Hypokalemia 04/27/2011   Measles as a child   Mumps as a child   Muscle cramp 10/14/2015   Obesity     Osteoarthritis of left knee 10/14/2015   Plantar fasciitis, left 01/19/2013   PONV (postoperative nausea and vomiting)    Preventative health care 02/13/2011   Sinusitis 02/13/2011    Past Surgical History:  Procedure Laterality Date   CYSTOSCOPY  05/16/2023   Procedure: CYSTOSCOPY;  Surgeon: Suzi Essex, MD;  [facility];  Service: Gynecology;;   ENDOMETRIAL ABLATION  01/10/2004   menorraghia   INJECTION, FOR SENTINEL LYMPH NODE IDENTIFICATION N/A 05/16/2023   Procedure: INJECTION, FOR SENTINEL LYMPH NODE IDENTIFICATION;  Surgeon: Suzi Essex, MD;  [facility];  Service: Gynecology;  Laterality: N/A;   KNEE SURGERY Left 01/10/2004   left knee  arthroscopy   LYMPH NODE BIOPSY N/A 05/16/2023   Procedure: LYMPH NODE BIOPSY;  Surgeon: Suzi Essex, MD;  [facility];  Service: Gynecology;  Laterality: N/A;   ROBOTIC ASSISTED TOTAL HYSTERECTOMY WITH BILATERAL SALPINGO OOPHERECTOMY N/A 05/16/2023   Procedure: HYSTERECTOMY, TOTAL, ROBOT-ASSISTED, LAPAROSCOPIC, WITH BILATERAL SALPINGO-OOPHORECTOMY;  Surgeon: Suzi Essex, MD;  [facility];  Service: Gynecology;  Laterality: N/A;   TONSILLECTOMY  1978   TOTAL KNEE ARTHROPLASTY Left 10/15/2015   Procedure: LEFT TOTAL KNEE ARTHROPLASTY;  Surgeon: Genevie Kerns, MD;  [facility];  Service: Orthopedics;  Laterality: Left;   TUBAL LIGATION     VULVA /PERINEUM BIOPSY  05/16/2023   Procedure: BIOPSY, VULVA;  Surgeon: Suzi Essex, MD;  [facility];  Service: Gynecology;;    Family History  Problem Relation Age of Onset   Cancer Mother 43       metastatic  lung cancer   Stroke Mother    Cancer Father        kidney   Factor V Leiden deficiency Maternal Aunt    Cancer Maternal Grandmother        bone   Factor V Leiden deficiency Maternal Grandmother    Cancer Paternal Grandmother        breast   Breast cancer Paternal Grandmother    Heart attack Paternal Grandfather    Asthma Daughter     Asthma Daughter    Ovarian cancer Neg Hx    Endometrial cancer Neg Hx    Pancreatic cancer Neg Hx    Colon cancer Neg Hx    Prostate cancer Neg Hx     Social History   Socioeconomic History   Marital status: Widowed    Spouse name: Not on file   Number of children: Not on file   Years of education: Not on file   Highest education level: Not on file  Occupational History   Not on file  Tobacco Use   Smoking status: Never   Smokeless tobacco: Never  Vaping Use   Vaping status: Never Used  Substance and Sexual Activity   Alcohol use: Yes    Comment: occas    Drug use: No   Sexual activity: Not Currently    Partners: Male  Other Topics Concern   Not on file  Social History Narrative   Not on file   Social Drivers of Health   Financial Resource Strain: Low Risk  (09/04/2019)   Received from [facility]   Overall Financial Resource Strain (CARDIA)    Difficulty of Paying Living Expenses: Not very hard  Food Insecurity: No Food Insecurity (05/08/2023)   Hunger Vital Sign    Worried About Running Out of Food in the Last Year: Never true    Ran Out of Food in the Last Year: Never true  Transportation Needs: No Transportation Needs (05/08/2023)   PRAPARE - Administrator, Civil Service (Medical): No    Lack of Transportation (Non-Medical): No  Physical Activity: Not on file  Stress: No Stress Concern Present (10/08/2019)   Received from [facility] of Occupational Health - Occupational Stress Questionnaire    Feeling of Stress : Only a little  Social Connections: Unknown (05/22/2021)   Received from [facility]   Social Network    Social Network: Not on file    Current Medications:  Current Outpatient Medications:    albuterol  (VENTOLIN  HFA) 108 (90 Base) MCG/ACT inhaler, TAKE 2 PUFFS BY MOUTH EVERY 6 HOURS AS NEEDED FOR WHEEZE OR SHORTNESS OF BREATH, Disp: 18 each, Rfl: 3   budesonide  (PULMICORT  FLEXHALER) 180 MCG/ACT inhaler,  Inhale 2 puffs into the lungs in the morning and at bedtime. (Patient taking differently: Inhale 2 puffs into the lungs 2 (two) times daily as needed (allergies (spring & fall months ONLY)).), Disp: 1 each, Rfl: 5   fenofibrate  (TRICOR ) 145 MG tablet, TAKE 1 TABLET BY MOUTH EVERY DAY, Disp: 90 tablet, Rfl: 1   methimazole  (TAPAZOLE ) 10 MG tablet, Take 1 tablet (10 mg total) by mouth daily., Disp: 90 tablet, Rfl: 1   metoprolol  succinate (TOPROL -XL) 100 MG 24 hr tablet, Take 100 mg by mouth in the morning and at bedtime. Take with or immediately following a meal., Disp: , Rfl:    montelukast  (SINGULAIR ) 10 MG tablet, TAKE 1 TABLET BY MOUTH EVERYDAY AT BEDTIME, Disp: 90 tablet, Rfl: 1  Multiple Vitamin (MULTIVITAMIN WITH MINERALS) TABS tablet, Take 1 tablet by mouth in the morning. Centrum Silver, Disp: , Rfl:    rivaroxaban  (XARELTO ) 10 MG TABS tablet, Take 1 tablet (10 mg total) by mouth daily. Plan to start taking the morning of May 9 and continue taking 10 mg once daily for 7 days after surgery., Disp: 7 tablet, Rfl: 0   rivaroxaban  (XARELTO ) 20 MG TABS tablet, Take 20 mg by mouth in the morning., Disp: , Rfl:    valsartan -hydrochlorothiazide  (DIOVAN -HCT) 160-12.5 MG tablet, TAKE 1 TABLET BY MOUTH EVERY DAY, Disp: 90 tablet, Rfl: 1  Review of Systems: See interval. + vaginal bleeding post-op  Physical Exam: BP (!) 143/79 (BP Location: Left Arm, Patient Position: Sitting)   Pulse 88   Temp 98.2 F (36.8 C) (Oral)   Resp 18   Ht 5' 8 (1.727 m)   Wt 258 lb (117 kg)   SpO2 98%   BMI 39.23 kg/m  General: Alert, oriented, no acute distress. Chest: Unlabored breathing on room air. Lungs clear. Heart regular in rate, occasional extra beat. Abdomen: Obese, soft, nontender.  Normoactive bowel sounds.  No masses or hepatosplenomegaly appreciated.  Well-healed incisions. Extremities: Grossly normal range of motion.  Warm, well perfused. Lower extremity edema bilaterally (not a new finding per pt),  on RLE at shin to ankle there are skin changes and tightening of tissue-has seen vascular for this in the past. GU: Normal appearing external genitalia without erythema, excoriation, or lesions.  Speculum exam reveals cuff intact, suture visible. There is minimal light brown drainage at the cuff. Monsels applied to the vaginal cuff with patient's consent.  Laboratory & Radiologic Studies: A. UTERUS, CERVIX, BILATERAL FALLOPIAN TUBES AND OVARIES, RESECTION: - Endometrioid carcinoma arising in a background of inactive endometrium. - See cancer summary below. - Cervix with Nabothian cysts and tunnel clusters. - Myometrium with adenomyosis and leiomyomata, largest measuring 1.8 cm. - Bilateral ovaries, negative for malignancy. - Bilateral fallopian tubes with fimbriated end, negative for malignancy.  B. RIGHT OBTURATOR SENTINEL LYMPH NODE: - One lymph node negative for malignancy (0/1).  C. RIGHT PELVIC LYMPH NODE: - One lymph node negative for malignancy (0/1).  D. LEFT OBTURATOR SENTINEL LYMPH NODE: - Three lymph nodes negative for malignancy (0/3).  E. RIGHT VULVA, 3 O'CLOCK, BIOPSY: - Skin with benign hemangioma.   CASE SUMMARY: (ENDOMETRIUM) Standard(s): AJCC 8, FIGO 2009 Staging (2018 Annual Report), FIGO 2023 Staging  SPECIMEN Procedure: Total hysterectomy with bilateral salpingo-oophorectomy  TUMOR Histologic Type: Endometrioid carcinoma Histologic Grade: FIGO grade 1 Molecular Type:      MMR Immunohistochemistry: Will be reported in an addendum      p53 Immunohistochemistry: Will be reported in an addendum Myometrial Invasion: Present      Depth of invasion (millimeters): 7 mm      Myometrial thickness (millimeters): 21 mm      Percentage of myometrial invasion: 33% Uterine Serosa Involvement: Not identified Cervical Involvement: Not identified Other Tissue/Organ Involvement: Not identified Lymphatic and/or Vascular Invasion: Not identified  Assessment &  Plan: Kathleen Church is a 63 y.o. 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 MMRp, p53 WT.  Patient is doing very well after surgery.  Discussed continued expectations and restrictions. Monsels applied to the vaginal cuff today and reportable signs and symptoms reviewed. Given the light spotting reported along with no significant bleeding noted at the cuff, she will continue on her daily dose of xarelto  20 mg. She  is aware if heavy bleeding develops, we may have to adjust the dose for a short period of time. Post-op restrictions reviewed. She is planning on returning to work on Monday.   Given early stage, low risk disease, plan for surveillance visits every 6 months alternating between our office and her OB/GYN.  Dr. Orvil Bland will see her for her first visit in 6 months.  We reviewed signs and symptoms that would be concerning for possible recurrence.  I stressed the importance of calling if she develops any of these between visits.  20 minutes of total time was spent for this patient encounter, including preparation, face-to-face counseling with the patient and coordination of care, and documentation of the encounter.  Vira Grieves NP [facility] GYN Oncology

## 2023-08-02 ENCOUNTER — Other Ambulatory Visit

## 2023-08-03 LAB — TSH: TSH: 2.92 m[IU]/L (ref 0.40–4.50)

## 2023-08-07 ENCOUNTER — Ambulatory Visit: Admitting: "Endocrinology

## 2023-08-07 ENCOUNTER — Encounter: Payer: Self-pay | Admitting: "Endocrinology

## 2023-08-07 VITALS — BP 136/70 | HR 85 | Ht 68.0 in | Wt 261.0 lb

## 2023-08-07 DIAGNOSIS — E0500 Thyrotoxicosis with diffuse goiter without thyrotoxic crisis or storm: Secondary | ICD-10-CM | POA: Diagnosis not present

## 2023-08-07 MED ORDER — METHIMAZOLE 10 MG PO TABS: 10.0000 mg | ORAL_TABLET | Freq: Every day | ORAL | 1 refills | Status: AC

## 2023-08-07 NOTE — Progress Notes (Signed)
 Outpatient Endocrinology Note [name], MD  08/07/23   Kathleen Church 1960/10/12 [id number]  Referring Provider: Domenica Harlene LABOR, MD Primary Care Provider: Domenica Harlene LABOR, MD Subjective  No chief complaint on file.   Assessment & Plan  Diagnoses and all orders for this visit:  Graves disease -     TSH -     T3, free -     T4, free -     methimazole  (TAPAZOLE ) 10 MG tablet; Take 1 tablet (10 mg total) by mouth daily.   Kathleen Church is currently taking methimazole  10 mg qd. Patient currently biochemically near-euthyroid.  Discussed the etiology for hyperthyroidism. Educated on thyroid  axis.  Recommend the following: Take methimazole  10 mg once a day. Repeat labs in 3 months or sooner if symptoms of hyper or hypothyroidism develop.  Educated on definitive options of treatment including RAI therapy and surgery. Patient does not want RAI at this time.  Counseled on: -complications of untreated hyperthyroidism including atrial fibrillation, heart failure and osteoporosis -side effects of Methimazole  including but not limited to allergic reaction, rash, bone marrow suppression and liver dysfunction -compliance and follow up needs    If you notice any symptoms of worsening fatigue, fever with sore throat, loss of appetite, yellowing of eyes, dark urine, joint pains, sores in the mouth, itchy rash, light colored stools or abdominal pain, please stop the medication and call us  immediately as this can be a serious side effect of the medication.  I have reviewed current medications, nurse's notes, allergies, vital signs, past medical and surgical history, family medical history, and social history for this encounter. Counseled patient on symptoms, examination findings, lab findings, imaging results, treatment decisions and monitoring and prognosis. The patient understood the recommendations and agrees with the treatment plan. All questions regarding treatment plan were  fully answered.   Return in about 4 months (around 12/08/2023) for visit + labs before next visit.   [name], MD  08/07/23   I have reviewed current medications, nurse's notes, allergies, vital signs, past medical and surgical history, family medical history, and social history for this encounter. Counseled patient on symptoms, examination findings, lab findings, imaging results, treatment decisions and monitoring and prognosis. The patient understood the recommendations and agrees with the treatment plan. All questions regarding treatment plan were fully answered.   History of Present Illness Kathleen Church is a 63 y.o. year old female who presents to our clinic with Grave's diagnosed in 2025.    Diagnosed of endometrial cancer pending hysterectomy.   Symptoms suggestive of HYPOTHYROIDISM:  fatigue No weight gain No cold intolerance  No constipation  No  Symptoms suggestive of HYPERTHYROIDISM:  weight loss  No heat intolerance No hyperdefecation  No palpitations  No  Compressive symptoms:  dysphagia  No dysphonia  No positional dyspnea (especially with simultaneous arms elevation)  No  Smokes  No On biotin  No Personal history of head/neck surgery/irradiation  No  Adverse Drug Effects from Methimazole  (MMI): rash No fever No throat pain No arthritis No mouth ulcers No jaundice No loss of appetite No lymphadenopathy No  Grave's Ophthalmopathy Clinical Activity Score: 0/9  Physical Exam  BP 136/70   Pulse 85   Ht 5' 8 (1.727 m)   Wt 261 lb (118.4 kg)   SpO2 96%   BMI 39.68 kg/m  Constitutional: well developed, well nourished Head: normocephalic, atraumatic, no exophthalmos Eyes: sclera anicteric, no redness Neck: no thyromegaly, no thyroid  tenderness; no nodules  palpated Lungs: normal respiratory effort Neurology: alert and oriented, no fine hand tremor Skin: dry, no appreciable rashes Musculoskeletal: no appreciable defects Psychiatric:  normal mood and affect  Allergies Allergies  Allergen Reactions   Codeine Palpitations and Rash    Current Medications Patient's Medications  New Prescriptions   No medications on file  Previous Medications   ALBUTEROL  (VENTOLIN  HFA) 108 (90 BASE) MCG/ACT INHALER    TAKE 2 PUFFS BY MOUTH EVERY 6 HOURS AS NEEDED FOR WHEEZE OR SHORTNESS OF BREATH   BUDESONIDE  (PULMICORT  FLEXHALER) 180 MCG/ACT INHALER    Inhale 2 puffs into the lungs in the morning and at bedtime.   FENOFIBRATE  (TRICOR ) 145 MG TABLET    TAKE 1 TABLET BY MOUTH EVERY DAY   METOPROLOL  SUCCINATE (TOPROL -XL) 100 MG 24 HR TABLET    Take 100 mg by mouth in the morning and at bedtime. Take with or immediately following a meal.   MONTELUKAST  (SINGULAIR ) 10 MG TABLET    TAKE 1 TABLET BY MOUTH EVERYDAY AT BEDTIME   MULTIPLE VITAMIN (MULTIVITAMIN WITH MINERALS) TABS TABLET    Take 1 tablet by mouth in the morning. Centrum Silver   RIVAROXABAN  (XARELTO ) 10 MG TABS TABLET    Take 1 tablet (10 mg total) by mouth daily. Plan to start taking the morning of May 9 and continue taking 10 mg once daily for 7 days after surgery.   RIVAROXABAN  (XARELTO ) 20 MG TABS TABLET    Take 20 mg by mouth in the morning.   VALSARTAN -HYDROCHLOROTHIAZIDE  (DIOVAN -HCT) 160-12.5 MG TABLET    TAKE 1 TABLET BY MOUTH EVERY DAY  Modified Medications   Modified Medication Previous Medication   METHIMAZOLE  (TAPAZOLE ) 10 MG TABLET methimazole  (TAPAZOLE ) 10 MG tablet      Take 1 tablet (10 mg total) by mouth daily.    Take 1 tablet (10 mg total) by mouth daily.  Discontinued Medications   No medications on file    Past Medical History Past Medical History:  Diagnosis Date   Allergic state    Anxiety    when father passed   Arthritis    Asthma, mild persistent    allergy induced asthma    Cancer (HCC)    endometrial cancer   Cervical cancer screening 04/27/2011   Dysrhythmia    A. fib   Edema 04/27/2011   Graves disease 02/2023   Heart murmur    as an  infant    Hyperlipidemia 04/27/2011   Hypertension 2010   Hypokalemia 04/27/2011   Measles as a child   Mumps as a child   Muscle cramp 10/14/2015   Obesity    Osteoarthritis of left knee 10/14/2015   Plantar fasciitis, left 01/19/2013   PONV (postoperative nausea and vomiting)    Preventative health care 02/13/2011   Sinusitis 02/13/2011    Past Surgical History Past Surgical History:  Procedure Laterality Date   CYSTOSCOPY  05/16/2023   Procedure: CYSTOSCOPY;  Surgeon: Viktoria Comer SAUNDERS, MD;  Location: [facility];  Service: Gynecology;;   ENDOMETRIAL ABLATION  01/10/2004   menorraghia   INJECTION, FOR SENTINEL LYMPH NODE IDENTIFICATION N/A 05/16/2023   Procedure: INJECTION, FOR SENTINEL LYMPH NODE IDENTIFICATION;  Surgeon: Viktoria Comer SAUNDERS, MD;  Location: [facility];  Service: Gynecology;  Laterality: N/A;   KNEE SURGERY Left 01/10/2004   left knee  arthroscopy   LYMPH NODE BIOPSY N/A 05/16/2023   Procedure: LYMPH NODE BIOPSY;  Surgeon: Viktoria Comer SAUNDERS, MD;  Location: [facility];  Service: Gynecology;  Laterality: N/A;   ROBOTIC ASSISTED TOTAL HYSTERECTOMY WITH BILATERAL SALPINGO OOPHERECTOMY N/A 05/16/2023   Procedure: HYSTERECTOMY, TOTAL, ROBOT-ASSISTED, LAPAROSCOPIC, WITH BILATERAL SALPINGO-OOPHORECTOMY;  Surgeon: Viktoria Comer SAUNDERS, MD;  Location: [facility];  Service: Gynecology;  Laterality: N/A;   TONSILLECTOMY  1978   TOTAL KNEE ARTHROPLASTY Left 10/15/2015   Procedure: LEFT TOTAL KNEE ARTHROPLASTY;  Surgeon: Lamar Collet, MD;  Location: [facility];  Service: Orthopedics;  Laterality: Left;   TUBAL LIGATION     VULVA /PERINEUM BIOPSY  05/16/2023   Procedure: BIOPSY, VULVA;  Surgeon: Viktoria Comer SAUNDERS, MD;  Location: [facility];  Service: Gynecology;;    Family History family history includes Asthma in her daughter and daughter; Breast cancer in her paternal grandmother; Cancer in her father, maternal grandmother, and paternal grandmother; Cancer (age of onset: 62) in her mother; Factor V  Leiden deficiency in her maternal aunt and maternal grandmother; Heart attack in her paternal grandfather; Stroke in her mother.  Social History Social History   Socioeconomic History   Marital status: Widowed    Spouse name: Not on file   Number of children: Not on file   Years of education: Not on file   Highest education level: Not on file  Occupational History   Not on file  Tobacco Use   Smoking status: Never   Smokeless tobacco: Never  Vaping Use   Vaping status: Never Used  Substance and Sexual Activity   Alcohol use: Yes    Comment: occas    Drug use: No   Sexual activity: Not Currently    Partners: Male  Other Topics Concern   Not on file  Social History Narrative   Not on file   Social Drivers of Health   Financial Resource Strain: Low Risk  (09/04/2019)   Received from [facility]   Overall Financial Resource Strain (CARDIA)    Difficulty of Paying Living Expenses: Not very hard  Food Insecurity: No Food Insecurity (05/08/2023)   Hunger Vital Sign    Worried About Running Out of Food in the Last Year: Never true    Ran Out of Food in the Last Year: Never true  Transportation Needs: No Transportation Needs (05/08/2023)   PRAPARE - Administrator, Civil Service (Medical): No    Lack of Transportation (Non-Medical): No  Physical Activity: Not on file  Stress: No Stress Concern Present (10/08/2019)   Received from [facility] - Occupational Stress Questionnaire    Feeling of Stress : Only a little  Social Connections: Unknown (05/22/2021)   Received from [facility]   Social Network    Social Network: Not on file  Intimate Partner Violence: Not At Risk (05/03/2023)   Humiliation, Afraid, Rape, and Kick questionnaire    Fear of Current or Ex-Partner: No    Emotionally Abused: No    Physically Abused: No    Sexually Abused: No    Laboratory Investigations Lab Results  Component Value Date   TSH  2.92 08/02/2023   TSH 0.621 05/10/2023   TSH 0.36 (L) 05/03/2023   FREET4 0.94 05/10/2023   FREET4 1.2 05/03/2023   FREET4 1.2 03/16/2023     Lab Results  Component Value Date   TSI 389 (H) 03/16/2023     No components found for: TRAB   Lab Results  Component Value Date   CHOL 148 01/25/2023   Lab Results  Component Value Date   HDL 38.60 (L) 01/25/2023  Lab Results  Component Value Date   LDLCALC 80 01/25/2023   Lab Results  Component Value Date   TRIG 144.0 01/25/2023   Lab Results  Component Value Date   CHOLHDL 4 01/25/2023   Lab Results  Component Value Date   CREATININE 0.58 05/10/2023   Lab Results  Component Value Date   GFR 97.74 01/26/2023      Component Value Date/Time   NA 139 05/10/2023 0855   K 3.5 05/10/2023 0855   CL 103 05/10/2023 0855   CO2 26 05/10/2023 0855   GLUCOSE 102 (H) 05/10/2023 0855   BUN 17 05/10/2023 0855   CREATININE 0.58 05/10/2023 0855   CREATININE 0.57 09/24/2019 1424   CALCIUM 9.2 05/10/2023 0855   PROT 7.2 05/10/2023 0855   ALBUMIN 3.6 05/10/2023 0855   AST 23 05/10/2023 0855   ALT 21 05/10/2023 0855   ALKPHOS 74 05/10/2023 0855   BILITOT 0.7 05/10/2023 0855   GFRNONAA >60 05/10/2023 0855   GFRAA >60 10/16/2015 0439      Latest Ref Rng & Units 05/10/2023    8:55 AM 01/26/2023    8:16 AM 01/25/2023    9:59 AM  BMP  Glucose 70 - 99 mg/dL 897  86  86   BUN 8 - 23 mg/dL 17  17  16    Creatinine 0.44 - 1.00 mg/dL 9.41  9.45  9.48   Sodium 135 - 145 mmol/L 139  143  141   Potassium 3.5 - 5.1 mmol/L 3.5  3.8  3.7   Chloride 98 - 111 mmol/L 103  105  105   CO2 22 - 32 mmol/L 26  25  29    Calcium 8.9 - 10.3 mg/dL 9.2  9.7  9.6        Component Value Date/Time   WBC 8.1 05/10/2023 0855   RBC 4.53 05/10/2023 0855   HGB 13.5 05/10/2023 0855   HCT 43.2 05/10/2023 0855   PLT 358 05/10/2023 0855   MCV 95.4 05/10/2023 0855   MCH 29.8 05/10/2023 0855   MCHC 31.3 05/10/2023 0855   RDW 13.3 05/10/2023 0855    LYMPHSABS 2.1 01/25/2023 0959   MONOABS 0.8 01/25/2023 0959   EOSABS 0.2 01/25/2023 0959   BASOSABS 0.1 01/25/2023 0959      Parts of this note may have been dictated using voice recognition software. There may be variances in spelling and vocabulary which are unintentional. Not all errors are proofread. Please notify the dino if any discrepancies are noted or if the meaning of any statement is not clear.

## 2023-10-19 ENCOUNTER — Other Ambulatory Visit: Payer: Self-pay | Admitting: Family Medicine

## 2023-11-14 ENCOUNTER — Other Ambulatory Visit: Payer: Self-pay | Admitting: Family Medicine

## 2023-11-20 ENCOUNTER — Other Ambulatory Visit

## 2023-11-21 LAB — TSH: TSH: 3.73 m[IU]/L (ref 0.40–4.50)

## 2023-11-21 LAB — T4, FREE: Free T4: 1.3 ng/dL (ref 0.8–1.8)

## 2023-11-21 LAB — T3, FREE: T3, Free: 3.9 pg/mL (ref 2.3–4.2)

## 2023-11-27 ENCOUNTER — Ambulatory Visit: Admitting: "Endocrinology

## 2023-11-27 ENCOUNTER — Encounter: Payer: Self-pay | Admitting: "Endocrinology

## 2023-11-27 VITALS — BP 140/80 | HR 54 | Ht 68.0 in | Wt 265.0 lb

## 2023-11-27 DIAGNOSIS — E0500 Thyrotoxicosis with diffuse goiter without thyrotoxic crisis or storm: Secondary | ICD-10-CM

## 2023-11-27 DIAGNOSIS — E010 Iodine-deficiency related diffuse (endemic) goiter: Secondary | ICD-10-CM | POA: Diagnosis not present

## 2023-11-27 NOTE — Progress Notes (Signed)
 Outpatient Endocrinology Note Kathleen Birmingham, MD  11/27/23   TALON WITTING [birth year] [id number]  Referring Provider: Domenica Harlene LABOR, MD Primary Care Provider: Domenica Harlene LABOR, MD Subjective  No chief complaint on file.   Assessment & Plan  Diagnoses and all orders for this visit:  Graves disease -     TSH -     T3, free -     T4, free -     Thyroid  stimulating immunoglobulin  Thyromegaly -     US  THYROID ; Future    ADDALINE PEPLINSKI is currently taking methimazole  10 mg qd. Patient is currently biochemically euthyroid.  Discussed the etiology for hyperthyroidism. Educated on thyroid  axis.  Recommend the following: Take methimazole  10 mg once a day. Repeat labs in 3 months or sooner if symptoms of hyper or hypothyroidism develop.  Educated on definitive options of treatment including RAI therapy and surgery. Patient does not want RAI at this time.  Counseled on: -complications of untreated hyperthyroidism including atrial fibrillation, heart failure and osteoporosis -side effects of Methimazole  including but not limited to allergic reaction, rash, bone marrow suppression and liver dysfunction -compliance and follow up needs    + thyromegaly note don right side No baseline thyroid  U/S Ordered baseline  If you notice any symptoms of worsening fatigue, fever with sore throat, loss of appetite, yellowing of eyes, dark urine, joint pains, sores in the mouth, itchy rash, light colored stools or abdominal pain, please stop the medication and call us  immediately as this can be a serious side effect of the medication.  I have reviewed current medications, nurse's notes, allergies, vital signs, past medical and surgical history, family medical history, and social history for this encounter. Counseled patient on symptoms, examination findings, lab findings, imaging results, treatment decisions and monitoring and prognosis. The patient understood the recommendations and  agrees with the treatment plan. All questions regarding treatment plan were fully answered.   Return in about 4 months (around 03/26/2024).   Kathleen Birmingham, MD  11/27/23   I have reviewed current medications, nurse's notes, allergies, vital signs, past medical and surgical history, family medical history, and social history for this encounter. Counseled patient on symptoms, examination findings, lab findings, imaging results, treatment decisions and monitoring and prognosis. The patient understood the recommendations and agrees with the treatment plan. All questions regarding treatment plan were fully answered.   History of Present Illness Kathleen Church is a 63 y.o. year old female who presents to our clinic with Grave's diagnosed in 2025.    Diagnosed of endometrial cancer pending hysterectomy.   Symptoms suggestive of HYPOTHYROIDISM:  fatigue No weight gain No cold intolerance  No constipation  No  Symptoms suggestive of HYPERTHYROIDISM:  weight loss  No heat intolerance No hyperdefecation  No palpitations  No  Compressive symptoms:  dysphagia  No dysphonia  No positional dyspnea (especially with simultaneous arms elevation)  No  Smokes  No On biotin  No Personal history of head/neck surgery/irradiation  No  Adverse Drug Effects from Methimazole  (MMI): rash No fever No throat pain No arthritis No mouth ulcers No jaundice No loss of appetite No lymphadenopathy No  Grave's Ophthalmopathy Clinical Activity Score: 0/9  Physical Exam  BP (!) 140/80   Pulse (!) 54   Ht 5' 8 (1.727 m)   Wt 265 lb (120.2 kg)   SpO2 95%   BMI 40.29 kg/m  Constitutional: well developed, well nourished Head: normocephalic, atraumatic, no exophthalmos Eyes: sclera anicteric, no  redness Neck: + R thyromegaly, no thyroid  tenderness; no nodules palpated Lungs: normal respiratory effort Neurology: alert and oriented, no fine hand tremor Skin: dry, no appreciable  rashes Musculoskeletal: no appreciable defects Psychiatric: normal mood and affect  Allergies Allergies  Allergen Reactions   Codeine Palpitations and Rash    Current Medications Patient's Medications  New Prescriptions   No medications on file  Previous Medications   ALBUTEROL  (VENTOLIN  HFA) 108 (90 BASE) MCG/ACT INHALER    TAKE 2 PUFFS BY MOUTH EVERY 6 HOURS AS NEEDED FOR WHEEZE OR SHORTNESS OF BREATH   BUDESONIDE  (PULMICORT  FLEXHALER) 180 MCG/ACT INHALER    Inhale 2 puffs into the lungs in the morning and at bedtime.   FENOFIBRATE  (TRICOR ) 145 MG TABLET    TAKE 1 TABLET BY MOUTH EVERY DAY   METHIMAZOLE  (TAPAZOLE ) 10 MG TABLET    Take 1 tablet (10 mg total) by mouth daily.   METOPROLOL  SUCCINATE (TOPROL -XL) 100 MG 24 HR TABLET    Take 100 mg by mouth in the morning and at bedtime. Take with or immediately following a meal.   MONTELUKAST  (SINGULAIR ) 10 MG TABLET    Take 1 tablet (10 mg total) by mouth at bedtime.   MULTIPLE VITAMIN (MULTIVITAMIN WITH MINERALS) TABS TABLET    Take 1 tablet by mouth in the morning. Centrum Silver   RIVAROXABAN  (XARELTO ) 10 MG TABS TABLET    Take 1 tablet (10 mg total) by mouth daily. Plan to start taking the morning of May 9 and continue taking 10 mg once daily for 7 days after surgery.   RIVAROXABAN  (XARELTO ) 20 MG TABS TABLET    Take 20 mg by mouth in the morning.   VALSARTAN -HYDROCHLOROTHIAZIDE  (DIOVAN -HCT) 160-12.5 MG TABLET    TAKE 1 TABLET BY MOUTH EVERY DAY  Modified Medications   No medications on file  Discontinued Medications   No medications on file    Past Medical History Past Medical History:  Diagnosis Date   Allergic state    Anxiety    when father passed   Arthritis    Asthma, mild persistent    allergy induced asthma    Cancer (HCC)    endometrial cancer   Cervical cancer screening 04/27/2011   Dysrhythmia    A. fib   Edema 04/27/2011   Graves disease 02/2023   Heart murmur    as an infant    Hyperlipidemia 04/27/2011    Hypertension 2010   Hypokalemia 04/27/2011   Measles as a child   Mumps as a child   Muscle cramp 10/14/2015   Obesity    Osteoarthritis of left knee 10/14/2015   Plantar fasciitis, left 01/19/2013   PONV (postoperative nausea and vomiting)    Preventative health care 02/13/2011   Sinusitis 02/13/2011    Past Surgical History Past Surgical History:  Procedure Laterality Date   CYSTOSCOPY  05/16/2023   Procedure: CYSTOSCOPY;  Surgeon: Viktoria Comer SAUNDERS, MD;  Location: [facility];  Service: Gynecology;;   ENDOMETRIAL ABLATION  01/10/2004   menorraghia   INJECTION, FOR SENTINEL LYMPH NODE IDENTIFICATION N/A 05/16/2023   Procedure: INJECTION, FOR SENTINEL LYMPH NODE IDENTIFICATION;  Surgeon: Viktoria Comer SAUNDERS, MD;  Location: [facility];  Service: Gynecology;  Laterality: N/A;   KNEE SURGERY Left 01/10/2004   left knee  arthroscopy   LYMPH NODE BIOPSY N/A 05/16/2023   Procedure: LYMPH NODE BIOPSY;  Surgeon: Viktoria Comer SAUNDERS, MD;  Location: [facility];  Service: Gynecology;  Laterality: N/A;   ROBOTIC ASSISTED TOTAL  HYSTERECTOMY WITH BILATERAL SALPINGO OOPHERECTOMY N/A 05/16/2023   Procedure: HYSTERECTOMY, TOTAL, ROBOT-ASSISTED, LAPAROSCOPIC, WITH BILATERAL SALPINGO-OOPHORECTOMY;  Surgeon: Viktoria Comer SAUNDERS, MD;  Location: [facility];  Service: Gynecology;  Laterality: N/A;   TONSILLECTOMY  1978   TOTAL KNEE ARTHROPLASTY Left 10/15/2015   Procedure: LEFT TOTAL KNEE ARTHROPLASTY;  Surgeon: Lamar Collet, MD;  Location: [facility];  Service: Orthopedics;  Laterality: Left;   TUBAL LIGATION     VULVA /PERINEUM BIOPSY  05/16/2023   Procedure: BIOPSY, VULVA;  Surgeon: Viktoria Comer SAUNDERS, MD;  Location: [facility];  Service: Gynecology;;    Family History family history includes Asthma in her daughter and daughter; Breast cancer in her paternal grandmother; Cancer in her father, maternal grandmother, and paternal grandmother; Cancer (age of onset: 29) in her mother; Factor V Leiden deficiency in her maternal  aunt and maternal grandmother; Heart attack in her paternal grandfather; Stroke in her mother.  Social History Social History   Socioeconomic History   Marital status: Widowed    Spouse name: Not on file   Number of children: Not on file   Years of education: Not on file   Highest education level: Not on file  Occupational History   Not on file  Tobacco Use   Smoking status: Never   Smokeless tobacco: Never  Vaping Use   Vaping status: Never Used  Substance and Sexual Activity   Alcohol use: Yes    Comment: occas    Drug use: No   Sexual activity: Not Currently    Partners: Male  Other Topics Concern   Not on file  Social History Narrative   Not on file   Social Drivers of Health   Financial Resource Strain: Low Risk  (09/04/2019)   Received from [facility]   Overall Financial Resource Strain (CARDIA)    Difficulty of Paying Living Expenses: Not very hard  Food Insecurity: No Food Insecurity (05/08/2023)   Hunger Vital Sign    Worried About Running Out of Food in the Last Year: Never true    Ran Out of Food in the Last Year: Never true  Transportation Needs: No Transportation Needs (05/08/2023)   PRAPARE - Administrator, Civil Service (Medical): No    Lack of Transportation (Non-Medical): No  Physical Activity: Not on file  Stress: No Stress Concern Present (10/08/2019)   Received from [facility] of Occupational Health - Occupational Stress Questionnaire    Feeling of Stress : Only a little  Social Connections: Not on file  Intimate Partner Violence: Not At Risk (05/03/2023)   Humiliation, Afraid, Rape, and Kick questionnaire    Fear of Current or Ex-Partner: No    Emotionally Abused: No    Physically Abused: No    Sexually Abused: No    Laboratory Investigations Lab Results  Component Value Date   TSH 3.73 11/20/2023   TSH 2.92 08/02/2023   TSH 0.621 05/10/2023   FREET4 1.3 11/20/2023   FREET4 0.94 05/10/2023   FREET4  1.2 05/03/2023     Lab Results  Component Value Date   TSI 389 (H) 03/16/2023     No components found for: TRAB   Lab Results  Component Value Date   CHOL 148 01/25/2023   Lab Results  Component Value Date   HDL 38.60 (L) 01/25/2023   Lab Results  Component Value Date   LDLCALC 80 01/25/2023   Lab Results  Component Value Date   TRIG 144.0 01/25/2023  Lab Results  Component Value Date   CHOLHDL 4 01/25/2023   Lab Results  Component Value Date   CREATININE 0.58 05/10/2023   Lab Results  Component Value Date   GFR 97.74 01/26/2023      Component Value Date/Time   NA 139 05/10/2023 0855   K 3.5 05/10/2023 0855   CL 103 05/10/2023 0855   CO2 26 05/10/2023 0855   GLUCOSE 102 (H) 05/10/2023 0855   BUN 17 05/10/2023 0855   CREATININE 0.58 05/10/2023 0855   CREATININE 0.57 09/24/2019 1424   CALCIUM 9.2 05/10/2023 0855   PROT 7.2 05/10/2023 0855   ALBUMIN 3.6 05/10/2023 0855   AST 23 05/10/2023 0855   ALT 21 05/10/2023 0855   ALKPHOS 74 05/10/2023 0855   BILITOT 0.7 05/10/2023 0855   GFRNONAA >60 05/10/2023 0855   GFRAA >60 10/16/2015 0439      Latest Ref Rng & Units 05/10/2023    8:55 AM 01/26/2023    8:16 AM 01/25/2023    9:59 AM  BMP  Glucose 70 - 99 mg/dL 897  86  86   BUN 8 - 23 mg/dL 17  17  16    Creatinine 0.44 - 1.00 mg/dL 9.41  9.45  9.48   Sodium 135 - 145 mmol/L 139  143  141   Potassium 3.5 - 5.1 mmol/L 3.5  3.8  3.7   Chloride 98 - 111 mmol/L 103  105  105   CO2 22 - 32 mmol/L 26  25  29    Calcium 8.9 - 10.3 mg/dL 9.2  9.7  9.6        Component Value Date/Time   WBC 8.1 05/10/2023 0855   RBC 4.53 05/10/2023 0855   HGB 13.5 05/10/2023 0855   HCT 43.2 05/10/2023 0855   PLT 358 05/10/2023 0855   MCV 95.4 05/10/2023 0855   MCH 29.8 05/10/2023 0855   MCHC 31.3 05/10/2023 0855   RDW 13.3 05/10/2023 0855   LYMPHSABS 2.1 01/25/2023 0959   MONOABS 0.8 01/25/2023 0959   EOSABS 0.2 01/25/2023 0959   BASOSABS 0.1 01/25/2023 0959       Parts of this note may have been dictated using voice recognition software. There may be variances in spelling and vocabulary which are unintentional. Not all errors are proofread. Please notify the dino if any discrepancies are noted or if the meaning of any statement is not clear.

## 2023-11-27 NOTE — Patient Instructions (Signed)
  If you notice any symptoms of worsening fatigue, fever with sore throat, loss of appetite, yellowing of eyes, dark urine, joint pains, sores in the mouth, itchy rash, light colored stools or abdominal pain, please stop the medication and call us immediately as this can be a serious side effect of the medication.

## 2023-12-05 ENCOUNTER — Ambulatory Visit (HOSPITAL_COMMUNITY)
Admission: RE | Admit: 2023-12-05 | Discharge: 2023-12-05 | Disposition: A | Discharge status: Home / Self Care | Source: Ambulatory Visit | Attending: "Endocrinology | Admitting: "Endocrinology

## 2023-12-05 DIAGNOSIS — E010 Iodine-deficiency related diffuse (endemic) goiter: Secondary | ICD-10-CM | POA: Diagnosis present

## 2023-12-18 ENCOUNTER — Encounter: Payer: Self-pay | Admitting: "Endocrinology

## 2023-12-18 ENCOUNTER — Telehealth: Admitting: "Endocrinology

## 2023-12-18 VITALS — Ht 68.0 in | Wt 265.0 lb

## 2023-12-18 DIAGNOSIS — E042 Nontoxic multinodular goiter: Secondary | ICD-10-CM | POA: Diagnosis not present

## 2023-12-18 DIAGNOSIS — E0500 Thyrotoxicosis with diffuse goiter without thyrotoxic crisis or storm: Secondary | ICD-10-CM | POA: Diagnosis not present

## 2023-12-18 NOTE — Progress Notes (Addendum)
 The patient reports they are currently: [name]. I spent 9 minutes on the video with the patient on the date of service. I spent an additional 7-8 minutes on pre- and post-visit activities on the date of service.   The patient was physically located in   or a state in which I am permitted to provide care. The patient and/or parent/guardian understood that s/he may incur co-pays and cost sharing, and agreed to the telemedicine visit. The visit was reasonable and appropriate under the circumstances given the patient's presentation at the time.  The patient and/or parent/guardian has been advised of the potential risks and limitations of this mode of treatment (including, but not limited to, the absence of in-person examination) and has agreed to be treated using telemedicine. The patient's/patient's family's questions regarding telemedicine have been answered.   The patient and/or parent/guardian has also been advised to contact their provider's office for worsening conditions, and seek emergency medical treatment and/or call 911 if the patient deems either necessary.    Outpatient Endocrinology Note Kathleen Birmingham, MD  12/18/23   Kathleen Church Dec 24, 1960 [id number]  Referring Provider: Domenica Harlene LABOR, MD Primary Care Provider: Domenica Harlene LABOR, MD Subjective  No chief complaint on file.   Assessment & Plan  There are no diagnoses linked to this encounter.   Kathleen Church is currently taking methimazole  10 mg qd. TSI/TRAb + Patient is currently biochemically euthyroid.  Discussed the etiology for hyperthyroidism. Educated on thyroid  axis.  Recommend the following: Take methimazole  10 mg once a day. Repeat labs in 3 months or sooner if symptoms of hyper or hypothyroidism develop.  Educated on definitive options of treatment including RAI therapy and surgery. Patient does not want RAI at this time.  Counseled on: -complications of untreated hyperthyroidism including  atrial fibrillation, heart failure and osteoporosis -side effects of Methimazole  including but not limited to allergic reaction, rash, bone marrow suppression and liver dysfunction -compliance and follow up needs    + thyromegaly note on right side No baseline thyroid  U/S: ordered 12/05/23: reviewed images and report of US  thyroid : Multinodular goiter. Nodule 1 (TR3 right mid: 4.7 cm x 2.6 x 4.2 cm) meets criteria for FNA. Nodule 2 (left mid: TR4 1.2 cm x 0.6 x 0.9 cm) meets criteria for imaging follow-up. Annual ultrasound surveillance is recommended until 5 years of stability is documented. 12/18/23: Ordered FNA for Nodule 1 (TR3 right mid: 4.7 cm x 2.6 x 4.2 cm)  Explained at length all possibilities of FNA results; patient understands and agreed  If you notice any symptoms of worsening fatigue, fever with sore throat, loss of appetite, yellowing of eyes, dark urine, joint pains, sores in the mouth, itchy rash, light colored stools or abdominal pain, please stop the medication and call us  immediately as this can be a serious side effect of the medication.  I have reviewed current medications, nurse's notes, allergies, vital signs, past medical and surgical history, family medical history, and social history for this encounter. Counseled patient on symptoms, examination findings, lab findings, imaging results, treatment decisions and monitoring and prognosis. The patient understood the recommendations and agrees with the treatment plan. All questions regarding treatment plan were fully answered.   No follow-ups on file.   Kathleen Birmingham, MD  12/18/23   I have reviewed current medications, nurse's notes, allergies, vital signs, past medical and surgical history, family medical history, and social history for this encounter. Counseled patient on symptoms, examination findings, lab findings, imaging results, treatment decisions  and monitoring and prognosis. The patient understood the  recommendations and agrees with the treatment plan. All questions regarding treatment plan were fully answered.   History of Present Illness Kathleen Church is a 63 y.o. year old female who presents to our clinic with Grave's diagnosed in 2025.    Diagnosed of endometrial cancer pending hysterectomy.   Symptoms suggestive of HYPOTHYROIDISM:  fatigue Yes, reports working crazy hours weight gain No cold intolerance  Yes, always cold constipation  No  Symptoms suggestive of HYPERTHYROIDISM:  weight loss  No heat intolerance No hyperdefecation  No palpitations  No  Compressive symptoms:  dysphagia  No dysphonia  No positional dyspnea (especially with simultaneous arms elevation)  No  Smokes  No On biotin  No Personal history of head/neck surgery/irradiation  No  Adverse Drug Effects from Methimazole  (MMI): rash No fever No throat pain No arthritis No mouth ulcers No jaundice No loss of appetite No lymphadenopathy No  Grave's Ophthalmopathy Clinical Activity Score: 0/9  Physical Exam  Ht 5' 8 (1.727 m)   Wt 265 lb (120.2 kg)   BMI 40.29 kg/m  Constitutional: well developed, well nourished Head: normocephalic, atraumatic, no exophthalmos Eyes: sclera anicteric, no redness Neck: + R thyromegaly, + R thyroid  nodule visualized Lungs: normal respiratory effort Neurology: alert and oriented, no fine hand tremor Skin: dry, no appreciable rashes Musculoskeletal: no appreciable defects Psychiatric: normal mood and affect  Allergies Allergies  Allergen Reactions   Codeine Palpitations and Rash    Current Medications Patient's Medications  New Prescriptions   No medications on file  Previous Medications   ALBUTEROL  (VENTOLIN  HFA) 108 (90 BASE) MCG/ACT INHALER    TAKE 2 PUFFS BY MOUTH EVERY 6 HOURS AS NEEDED FOR WHEEZE OR SHORTNESS OF BREATH   BUDESONIDE  (PULMICORT  FLEXHALER) 180 MCG/ACT INHALER    Inhale 2 puffs into the lungs in the morning and at bedtime.    FENOFIBRATE  (TRICOR ) 145 MG TABLET    TAKE 1 TABLET BY MOUTH EVERY DAY   METHIMAZOLE  (TAPAZOLE ) 10 MG TABLET    Take 1 tablet (10 mg total) by mouth daily.   METOPROLOL  SUCCINATE (TOPROL -XL) 100 MG 24 HR TABLET    Take 100 mg by mouth in the morning and at bedtime. Take with or immediately following a meal.   MONTELUKAST  (SINGULAIR ) 10 MG TABLET    Take 1 tablet (10 mg total) by mouth at bedtime.   MULTIPLE VITAMIN (MULTIVITAMIN WITH MINERALS) TABS TABLET    Take 1 tablet by mouth in the morning. Centrum Silver   RIVAROXABAN  (XARELTO ) 10 MG TABS TABLET    Take 1 tablet (10 mg total) by mouth daily. Plan to start taking the morning of May 9 and continue taking 10 mg once daily for 7 days after surgery.   RIVAROXABAN  (XARELTO ) 20 MG TABS TABLET    Take 20 mg by mouth in the morning.   VALSARTAN -HYDROCHLOROTHIAZIDE  (DIOVAN -HCT) 160-12.5 MG TABLET    TAKE 1 TABLET BY MOUTH EVERY DAY  Modified Medications   No medications on file  Discontinued Medications   No medications on file    Past Medical History Past Medical History:  Diagnosis Date   Allergic state    Anxiety    when father passed   Arthritis    Asthma, mild persistent    allergy induced asthma    Cancer (HCC)    endometrial cancer   Cervical cancer screening 04/27/2011   Dysrhythmia    A. fib   Edema 04/27/2011  Graves disease 02/2023   Heart murmur    as an infant    Hyperlipidemia 04/27/2011   Hypertension 2010   Hypokalemia 04/27/2011   Measles as a child   Mumps as a child   Muscle cramp 10/14/2015   Obesity    Osteoarthritis of left knee 10/14/2015   Plantar fasciitis, left 01/19/2013   PONV (postoperative nausea and vomiting)    Preventative health care 02/13/2011   Sinusitis 02/13/2011    Past Surgical History Past Surgical History:  Procedure Laterality Date   CYSTOSCOPY  05/16/2023   Procedure: CYSTOSCOPY;  Surgeon: Viktoria Comer SAUNDERS, MD;  Location: [facility];  Service: Gynecology;;   ENDOMETRIAL  ABLATION  01/10/2004   menorraghia   INJECTION, FOR SENTINEL LYMPH NODE IDENTIFICATION N/A 05/16/2023   Procedure: INJECTION, FOR SENTINEL LYMPH NODE IDENTIFICATION;  Surgeon: Viktoria Comer SAUNDERS, MD;  Location: [facility];  Service: Gynecology;  Laterality: N/A;   KNEE SURGERY Left 01/10/2004   left knee  arthroscopy   LYMPH NODE BIOPSY N/A 05/16/2023   Procedure: LYMPH NODE BIOPSY;  Surgeon: Viktoria Comer SAUNDERS, MD;  Location: [facility];  Service: Gynecology;  Laterality: N/A;   ROBOTIC ASSISTED TOTAL HYSTERECTOMY WITH BILATERAL SALPINGO OOPHERECTOMY N/A 05/16/2023   Procedure: HYSTERECTOMY, TOTAL, ROBOT-ASSISTED, LAPAROSCOPIC, WITH BILATERAL SALPINGO-OOPHORECTOMY;  Surgeon: Viktoria Comer SAUNDERS, MD;  Location: [facility];  Service: Gynecology;  Laterality: N/A;   TONSILLECTOMY  1978   TOTAL KNEE ARTHROPLASTY Left 10/15/2015   Procedure: LEFT TOTAL KNEE ARTHROPLASTY;  Surgeon: Lamar Collet, MD;  Location: [facility];  Service: Orthopedics;  Laterality: Left;   TUBAL LIGATION     VULVA /PERINEUM BIOPSY  05/16/2023   Procedure: BIOPSY, VULVA;  Surgeon: Viktoria Comer SAUNDERS, MD;  Location: [facility];  Service: Gynecology;;    Family History family history includes Asthma in her daughter and daughter; Breast cancer in her paternal grandmother; Cancer in her father, maternal grandmother, and paternal grandmother; Cancer (age of onset: 65) in her mother; Factor V Leiden deficiency in her maternal aunt and maternal grandmother; Heart attack in her paternal grandfather; Stroke in her mother.  Social History Social History   Socioeconomic History   Marital status: Widowed    Spouse name: Not on file   Number of children: Not on file   Years of education: Not on file   Highest education level: Not on file  Occupational History   Not on file  Tobacco Use   Smoking status: Never   Smokeless tobacco: Never  Vaping Use   Vaping status: Never Used  Substance and Sexual Activity   Alcohol use: Yes    Comment: occas     Drug use: No   Sexual activity: Not Currently    Partners: Male  Other Topics Concern   Not on file  Social History Narrative   Not on file   Social Drivers of Health   Financial Resource Strain: Low Risk (09/04/2019)   Received from [facility]   Overall Financial Resource Strain (CARDIA)    Difficulty of Paying Living Expenses: Not very hard  Food Insecurity: No Food Insecurity (05/08/2023)   Hunger Vital Sign    Worried About Running Out of Food in the Last Year: Never true    Ran Out of Food in the Last Year: Never true  Transportation Needs: No Transportation Needs (05/08/2023)   PRAPARE - Administrator, Civil Service (Medical): No    Lack of Transportation (Non-Medical): No  Physical Activity: Not on file  Stress: No Stress Concern Present (10/08/2019)   Received from [facility] - Occupational Stress Questionnaire    Feeling of Stress : Only a little  Social Connections: Not on file  Intimate Partner Violence: Not At Risk (05/03/2023)   Humiliation, Afraid, Rape, and Kick questionnaire    Fear of Current or Ex-Partner: No    Emotionally Abused: No    Physically Abused: No    Sexually Abused: No    Laboratory Investigations Lab Results  Component Value Date   TSH 3.73 11/20/2023   TSH 2.92 08/02/2023   TSH 0.621 05/10/2023   FREET4 1.3 11/20/2023   FREET4 0.94 05/10/2023   FREET4 1.2 05/03/2023     Lab Results  Component Value Date   TSI 389 (H) 03/16/2023     No components found for: TRAB   Lab Results  Component Value Date   CHOL 148 01/25/2023   Lab Results  Component Value Date   HDL 38.60 (L) 01/25/2023   Lab Results  Component Value Date   LDLCALC 80 01/25/2023   Lab Results  Component Value Date   TRIG 144.0 01/25/2023   Lab Results  Component Value Date   CHOLHDL 4 01/25/2023   Lab Results  Component Value Date   CREATININE 0.58 05/10/2023   Lab Results  Component Value  Date   GFR 97.74 01/26/2023      Component Value Date/Time   NA 139 05/10/2023 0855   K 3.5 05/10/2023 0855   CL 103 05/10/2023 0855   CO2 26 05/10/2023 0855   GLUCOSE 102 (H) 05/10/2023 0855   BUN 17 05/10/2023 0855   CREATININE 0.58 05/10/2023 0855   CREATININE 0.57 09/24/2019 1424   CALCIUM 9.2 05/10/2023 0855   PROT 7.2 05/10/2023 0855   ALBUMIN 3.6 05/10/2023 0855   AST 23 05/10/2023 0855   ALT 21 05/10/2023 0855   ALKPHOS 74 05/10/2023 0855   BILITOT 0.7 05/10/2023 0855   GFRNONAA >60 05/10/2023 0855   GFRAA >60 10/16/2015 0439      Latest Ref Rng & Units 05/10/2023    8:55 AM 01/26/2023    8:16 AM 01/25/2023    9:59 AM  BMP  Glucose 70 - 99 mg/dL 897  86  86   BUN 8 - 23 mg/dL 17  17  16    Creatinine 0.44 - 1.00 mg/dL 9.41  9.45  9.48   Sodium 135 - 145 mmol/L 139  143  141   Potassium 3.5 - 5.1 mmol/L 3.5  3.8  3.7   Chloride 98 - 111 mmol/L 103  105  105   CO2 22 - 32 mmol/L 26  25  29    Calcium 8.9 - 10.3 mg/dL 9.2  9.7  9.6        Component Value Date/Time   WBC 8.1 05/10/2023 0855   RBC 4.53 05/10/2023 0855   HGB 13.5 05/10/2023 0855   HCT 43.2 05/10/2023 0855   PLT 358 05/10/2023 0855   MCV 95.4 05/10/2023 0855   MCH 29.8 05/10/2023 0855   MCHC 31.3 05/10/2023 0855   RDW 13.3 05/10/2023 0855   LYMPHSABS 2.1 01/25/2023 0959   MONOABS 0.8 01/25/2023 0959   EOSABS 0.2 01/25/2023 0959   BASOSABS 0.1 01/25/2023 0959      Parts of this note may have been dictated using voice recognition software. There may be variances in spelling and vocabulary which are unintentional. Not all errors are proofread. Please notify  the dino if any discrepancies are noted or if the meaning of any statement is not clear.

## 2023-12-28 ENCOUNTER — Inpatient Hospital Stay: Attending: Gynecologic Oncology | Admitting: Gynecologic Oncology

## 2023-12-28 ENCOUNTER — Encounter: Payer: Self-pay | Admitting: Gynecologic Oncology

## 2023-12-28 VITALS — BP 144/78 | HR 64 | Temp 98.7°F | Resp 19 | Wt 264.8 lb

## 2023-12-28 DIAGNOSIS — C541 Malignant neoplasm of endometrium: Secondary | ICD-10-CM | POA: Diagnosis not present

## 2023-12-28 DIAGNOSIS — N898 Other specified noninflammatory disorders of vagina: Secondary | ICD-10-CM | POA: Diagnosis not present

## 2023-12-28 NOTE — Progress Notes (Signed)
 Gynecologic Oncology Return Clinic Visit  12/28/2023  Reason for Visit: surveillance  Treatment History: The patient developed intermittent postmenopausal spotting/bleeding in 10/2022. Pelvic ultrasound on 01/25/2023 revealed uterus measuring 6.5 x 4.4 x 3.7 cm, with 1.9 cm intramural fibroid.  Endometrium measured 5 mm with normal echotexture.  Neither ovaries visualized. Endometrial biopsy on 4/10 revealed FIGO grade 1 endometrioid adenocarcinoma.   05/16/23: Robotic-assisted laparoscopic total hysterectomy with bilateral salpingoophorectomy, SLN biopsy bilaterally with limited pelvic lymphadenectomy, cystoscopy, vulvar biopsy   Interval History: Doing well.  Denies any vaginal bleeding or discharge.  Reports baseline bowel and bladder function.  Having thyroid  nodule biopsy next week.  Past Medical/Surgical History: Past Medical History:  Diagnosis Date   Allergic state    Anxiety    when father passed   Arthritis    Asthma, mild persistent    allergy induced asthma    Cancer (HCC)    endometrial cancer   Cervical cancer screening 04/27/2011   Dysrhythmia    A. fib   Edema 04/27/2011   Graves disease 02/2023   Heart murmur    as an infant    Hyperlipidemia 04/27/2011   Hypertension 2010   Hypokalemia 04/27/2011   Measles as a child   Mumps as a child   Muscle cramp 10/14/2015   Obesity    Osteoarthritis of left knee 10/14/2015   Plantar fasciitis, left 01/19/2013   PONV (postoperative nausea and vomiting)    Preventative health care 02/13/2011   Sinusitis 02/13/2011    Past Surgical History:  Procedure Laterality Date   CYSTOSCOPY  05/16/2023   Procedure: CYSTOSCOPY;  Surgeon: Kathleen Comer SAUNDERS, Church;  Location: [facility];  Service: Gynecology;;   ENDOMETRIAL ABLATION  01/10/2004   menorraghia   INJECTION, FOR SENTINEL LYMPH NODE IDENTIFICATION N/A 05/16/2023   Procedure: INJECTION, FOR SENTINEL LYMPH NODE IDENTIFICATION;  Surgeon: Kathleen Comer SAUNDERS, Church;  Location:  [facility];  Service: Gynecology;  Laterality: N/A;   KNEE SURGERY Left 01/10/2004   left knee  arthroscopy   LYMPH NODE BIOPSY N/A 05/16/2023   Procedure: LYMPH NODE BIOPSY;  Surgeon: Kathleen Comer SAUNDERS, Church;  Location: [facility];  Service: Gynecology;  Laterality: N/A;   ROBOTIC ASSISTED TOTAL HYSTERECTOMY WITH BILATERAL SALPINGO OOPHERECTOMY N/A 05/16/2023   Procedure: HYSTERECTOMY, TOTAL, ROBOT-ASSISTED, LAPAROSCOPIC, WITH BILATERAL SALPINGO-OOPHORECTOMY;  Surgeon: Kathleen Comer SAUNDERS, Church;  Location: [facility];  Service: Gynecology;  Laterality: N/A;   TONSILLECTOMY  1978   TOTAL KNEE ARTHROPLASTY Left 10/15/2015   Procedure: LEFT TOTAL KNEE ARTHROPLASTY;  Surgeon: Lamar Collet, Church;  Location: [facility];  Service: Orthopedics;  Laterality: Left;   TUBAL LIGATION     VULVA /PERINEUM BIOPSY  05/16/2023   Procedure: BIOPSY, VULVA;  Surgeon: Kathleen Comer SAUNDERS, Church;  Location: [facility];  Service: Gynecology;;    Family History  Problem Relation Age of Onset   Cancer Mother 65       metastatic lung cancer   Stroke Mother    Cancer Father        kidney   Factor V Leiden deficiency Maternal Aunt    Cancer Maternal Grandmother        bone   Factor V Leiden deficiency Maternal Grandmother    Cancer Paternal Grandmother        breast   Breast cancer Paternal Grandmother    Heart attack Paternal Grandfather    Asthma Daughter    Asthma Daughter    Ovarian cancer Neg Hx    Endometrial cancer Neg  Hx    Pancreatic cancer Neg Hx    Colon cancer Neg Hx    Prostate cancer Neg Hx     Social History   Socioeconomic History   Marital status: Widowed    Spouse name: Not on file   Number of children: Not on file   Years of education: Not on file   Highest education level: Not on file  Occupational History   Not on file  Tobacco Use   Smoking status: Never   Smokeless tobacco: Never  Vaping Use   Vaping status: Never Used  Substance and Sexual Activity   Alcohol use: Yes    Comment: occas    Drug  use: No   Sexual activity: Not Currently    Partners: Male  Other Topics Concern   Not on file  Social History Narrative   Not on file   Social Drivers of Health   Tobacco Use: Low Risk (12/18/2023)   Patient History    Smoking Tobacco Use: Never    Smokeless Tobacco Use: Never    Passive Exposure: Not on file  Financial Resource Strain: Not on file  Food Insecurity: No Food Insecurity (05/08/2023)   Hunger Vital Sign    Worried About Running Out of Food in the Last Year: Never true    Ran Out of Food in the Last Year: Never true  Transportation Needs: No Transportation Needs (05/08/2023)   PRAPARE - Administrator, Civil Service (Medical): No    Lack of Transportation (Non-Medical): No  Physical Activity: Not on file  Stress: Not on file  Social Connections: Not on file  Depression (PHQ2-9): Low Risk (01/25/2023)   Depression (PHQ2-9)    PHQ-2 Score: 0  Alcohol Screen: Not on file  Housing: Low Risk (05/08/2023)   Housing Stability Vital Sign    Unable to Pay for Housing in the Last Year: No    Number of Times Moved in the Last Year: 0    Homeless in the Last Year: No  Utilities: Not At Risk (05/03/2023)   [employer] Utilities    Threatened with loss of utilities: No  Health Literacy: Not on file    Current Medications: Current Medications[1]  Review of Systems: + Cough Denies appetite changes, fevers, chills, fatigue, unexplained weight changes. Denies hearing loss, neck lumps or masses, mouth sores, ringing in ears or voice changes. Denies wheezing.  Denies shortness of breath. Denies chest pain or palpitations. Denies leg swelling. Denies abdominal distention, pain, blood in stools, constipation, diarrhea, nausea, vomiting, or early satiety. Denies pain with intercourse, dysuria, frequency, hematuria or incontinence. Denies hot flashes, pelvic pain, vaginal bleeding or vaginal discharge.   Denies joint pain, back pain or muscle pain/cramps. Denies itching,  rash, or wounds. Denies dizziness, headaches, numbness or seizures. Denies easy bruising or bleeding. Denies anxiety, depression, confusion, or decreased concentration.  Physical Exam: BP (!) 153/89 (BP Location: Left Arm, Patient Position: Sitting)   Pulse 64   Temp 98.7 F (37.1 C) (Oral)   Resp 19   Wt 264 lb 12.8 oz (120.1 kg)   SpO2 95%   BMI 40.26 kg/m  General: Alert, oriented, no acute distress. HEENT: Posterior oropharynx clear, sclera anicteric. Chest: Clear to auscultation bilaterally.  No wheezes or rhonchi. Cardiovascular: Regular rate and rhythm, no murmurs. Abdomen: Obese, soft, nontender.  Normoactive bowel sounds.  No masses or hepatosplenomegaly appreciated.  Well-healed incisions. Extremities: Grossly normal range of motion.  Warm, well perfused.  No edema bilaterally.  Skin: No rashes or lesions noted. Lymphatics: No cervical, supraclavicular, or inguinal adenopathy. GU: Normal appearing external genitalia without erythema, excoriation, or lesions.  Speculum exam reveals polypoid 2 cm lesion at the vaginal cuff, bleeds a little bit with speculum manipulation.  Bimanual exam reveals cuff smooth other than polypoid lesion which itself is also smooth.    Vaginal biopsy procedure Preoperative diagnosis: Vaginal cuff lesion Postoperative diagnosis: Same as above Physician: Kathleen Church Estimated blood loss: Minimal Specimens: Vaginal biopsy Procedure: Patient was already in dorsolithotomy position with a speculum in the vagina.  After the lesion was visualized, procedure was discussed with the patient and she gave verbal consent.  The area was cleansed with Betadine x 3.  Tischler forceps were then used to biopsy the polypoid lesion.  This was placed in formalin.  Overall the patient tolerated the procedure well.  All instruments were removed from the vagina.  Laboratory & Radiologic Studies: None new  Assessment & Plan: CLEVA CAMERO is a 63 y.o. 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 Surgery 05/2023. MMRp, p53 WT.  Patient is doing very well.  Polypoid lesion noted on exam today.  Looks most consistent with granulation tissue but bled with speculum manipulation.  Biopsy taken today.  I will contact the patient with these results.  Per NCCN surveillance recommendations, we will continue with visits every 6 months alternating between my office and the patient's OB/GYN as long as the biopsy from today is benign.  I will see her back in 1 year.  If biopsy shows recurrence, we will set up follow-up when I call her.  We reviewed signs and symptoms that could be concerning for disease recurrence, and I stressed the importance of calling if she develops any of these between visits.  22 minutes of total time was spent for this patient encounter, including preparation, face-to-face counseling with the patient and coordination of care, and documentation of the encounter.  Comer Viktoria, Church  Division of Gynecologic Oncology  Department of Obstetrics and Gynecology  [facility]      [1]  Current Outpatient Medications:    albuterol  (VENTOLIN  HFA) 108 (90 Base) MCG/ACT inhaler, TAKE 2 PUFFS BY MOUTH EVERY 6 HOURS AS NEEDED FOR WHEEZE OR SHORTNESS OF BREATH, Disp: 18 each, Rfl: 3   budesonide  (PULMICORT  FLEXHALER) 180 MCG/ACT inhaler, Inhale 2 puffs into the lungs in the morning and at bedtime. (Patient taking differently: Inhale 2 puffs into the lungs 2 (two) times daily as needed (allergies (spring & fall months ONLY)).), Disp: 1 each, Rfl: 5   fenofibrate  (TRICOR ) 145 MG tablet, TAKE 1 TABLET BY MOUTH EVERY DAY, Disp: 90 tablet, Rfl: 1   methimazole  (TAPAZOLE ) 10 MG tablet, Take 1 tablet (10 mg total) by mouth daily., Disp: 90 tablet, Rfl: 1   metoprolol  succinate (TOPROL -XL) 100 MG 24 hr tablet, Take 100 mg by mouth in the morning and at bedtime. Take with or immediately  following a meal., Disp: , Rfl:    montelukast  (SINGULAIR ) 10 MG tablet, Take 1 tablet (10 mg total) by mouth at bedtime., Disp: 90 tablet, Rfl: 1   Multiple Vitamin (MULTIVITAMIN WITH MINERALS) TABS tablet, Take 1 tablet by mouth in the morning. Centrum Silver, Disp: , Rfl:    rivaroxaban  (XARELTO ) 10 MG TABS tablet, Take 1 tablet (10 mg total) by mouth daily. Plan to start taking the morning of May 9 and continue taking 10 mg once daily for  7 days after surgery., Disp: 7 tablet, Rfl: 0   rivaroxaban  (XARELTO ) 20 MG TABS tablet, Take 20 mg by mouth in the morning., Disp: , Rfl:    valsartan -hydrochlorothiazide  (DIOVAN -HCT) 160-12.5 MG tablet, TAKE 1 TABLET BY MOUTH EVERY DAY, Disp: 90 tablet, Rfl: 1

## 2023-12-28 NOTE — Patient Instructions (Signed)
 It was good to see you today.   There was a small area top of the vagina that I biopsied today.  I will have these results next week and we will let you know.  This may be scar tissue from healing after surgery.  Biopsy is benign, I would like you to see your OB/GYN in 6 months and we will see you for follow-up in 12 months.  As always, if you develop any new and concerning symptoms before your next visit, please call to see me sooner.

## 2024-01-01 ENCOUNTER — Ambulatory Visit
Admission: RE | Admit: 2024-01-01 | Discharge: 2024-01-01 | Disposition: A | Discharge status: Home / Self Care | Source: Ambulatory Visit | Attending: "Endocrinology | Admitting: "Endocrinology

## 2024-01-01 ENCOUNTER — Ambulatory Visit: Payer: Self-pay | Admitting: Gynecologic Oncology

## 2024-01-01 ENCOUNTER — Other Ambulatory Visit (HOSPITAL_COMMUNITY)
Admission: RE | Admit: 2024-01-01 | Discharge: 2024-01-01 | Disposition: A | Discharge status: Home / Self Care | Source: Ambulatory Visit | Attending: Physician Assistant | Admitting: Physician Assistant

## 2024-01-01 DIAGNOSIS — E042 Nontoxic multinodular goiter: Secondary | ICD-10-CM | POA: Diagnosis present

## 2024-01-01 LAB — SURGICAL PATHOLOGY

## 2024-01-02 LAB — CYTOLOGY - NON PAP

## 2024-01-04 ENCOUNTER — Encounter: Payer: Self-pay | Admitting: "Endocrinology

## 2024-01-04 ENCOUNTER — Ambulatory Visit: Payer: Self-pay | Admitting: "Endocrinology

## 2024-01-08 ENCOUNTER — Encounter: Payer: Self-pay | Admitting: "Endocrinology

## 2024-01-08 ENCOUNTER — Telehealth (INDEPENDENT_AMBULATORY_CARE_PROVIDER_SITE_OTHER): Admitting: "Endocrinology

## 2024-01-08 VITALS — Ht 68.0 in | Wt 264.0 lb

## 2024-01-08 DIAGNOSIS — E042 Nontoxic multinodular goiter: Secondary | ICD-10-CM

## 2024-01-08 DIAGNOSIS — E0500 Thyrotoxicosis with diffuse goiter without thyrotoxic crisis or storm: Secondary | ICD-10-CM

## 2024-01-08 NOTE — Progress Notes (Signed)
 "  The patient reports they are currently: [place]. I spent 7-8 minutes on the video with the patient on the date of service. I spent an additional 2 minutes on pre- and post-visit activities on the date of service.   The patient was physically located in [place]  or a state in which I am permitted to provide care. The patient and/or parent/guardian understood that s/he may incur co-pays and cost sharing, and agreed to the telemedicine visit. The visit was reasonable and appropriate under the circumstances given the patient's presentation at the time.  The patient and/or parent/guardian has been advised of the potential risks and limitations of this mode of treatment (including, but not limited to, the absence of in-person examination) and has agreed to be treated using telemedicine. The patient's/patient's family's questions regarding telemedicine have been answered.   The patient and/or parent/guardian has also been advised to contact their provider's office for worsening conditions, and seek emergency medical treatment and/or call 911 if the patient deems either necessary.    Outpatient Endocrinology Note [name], MD  01/08/2024   Kathleen Church 12/02/1960 [id number]  Referring Provider: Domenica Harlene LABOR, MD Primary Care Provider: Domenica Harlene LABOR, MD Subjective  No chief complaint on file.   Assessment & Plan  Diagnoses and all orders for this visit:  Graves disease  Multinodular goiter     CORY RAMA is currently taking methimazole  10 mg qd. TSI/TRAb + Patient is currently biochemically euthyroid.  Discussed the etiology for hyperthyroidism. Educated on thyroid  axis.  Recommend the following: Take methimazole  10 mg once a day. Repeat labs in 3 months or sooner if symptoms of hyper or hypothyroidism develop.  Educated on definitive options of treatment including RAI therapy and surgery. Patient does not want RAI at this time.  Counseled on: -complications of  untreated hyperthyroidism including atrial fibrillation, heart failure and osteoporosis -side effects of Methimazole  including but not limited to allergic reaction, rash, bone marrow suppression and liver dysfunction -compliance and follow up needs    + thyromegaly note on right side No baseline thyroid  U/S: ordered 12/05/23: reviewed images and report of US  thyroid : Multinodular goiter. Nodule 1 (TR3 right mid: 4.7 cm x 2.6 x 4.2 cm) meets criteria for FNA. Nodule 2 (left mid: TR4 1.2 cm x 0.6 x 0.9 cm) meets criteria for imaging follow-up. Annual ultrasound surveillance is recommended until 5 years of stability is documented. 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  I have reviewed current medications, nurse's notes, allergies, vital signs, past medical and surgical history, family medical history, and social history for this encounter. Counseled patient on symptoms, examination findings, lab findings, imaging results, treatment decisions and monitoring and prognosis. The patient understood the recommendations and agrees with the treatment plan. All questions regarding treatment plan were fully answered.   No follow-ups on file.   [name], MD  01/08/2024   I have reviewed current medications, nurse's notes, allergies, vital signs, past medical and surgical  history, family medical history, and social history for this encounter. Counseled patient on  symptoms, examination findings, lab findings, imaging results, treatment decisions and monitoring and prognosis. The patient understood the recommendations and agrees with the treatment plan. All questions regarding treatment plan were fully answered.   History of Present Illness Kathleen Church is a 63 y.o. year old female who presents to our clinic with Grave's diagnosed in 2025.    Diagnosed of endometrial cancer pending hysterectomy.   Symptoms suggestive of HYPOTHYROIDISM:  fatigue Yes, reports working crazy hours weight gain No cold intolerance  Yes, always cold constipation  No  Symptoms suggestive of HYPERTHYROIDISM:  weight loss  No heat intolerance No hyperdefecation  No palpitations  No  Compressive symptoms:  dysphagia  No dysphonia  No positional dyspnea (especially with simultaneous arms elevation)  No  Smokes  No On biotin  No Personal history of head/neck surgery/irradiation  No  Adverse Drug Effects from Methimazole  (MMI): rash No fever No throat pain No arthritis No mouth ulcers No jaundice No loss of appetite No lymphadenopathy No  Grave's Ophthalmopathy Clinical Activity Score: 0/9  Physical Exam  Ht 5' 8 (1.727 m)   Wt 264 lb (119.7 kg)   BMI 40.14 kg/m  Constitutional: well developed, well nourished Head: normocephalic, atraumatic, no exophthalmos Eyes: sclera anicteric, no redness Neck: + R thyromegaly, + R thyroid  nodule visualized Lungs: normal respiratory effort Neurology: alert and oriented, no fine hand tremor Skin: dry, no appreciable rashes Musculoskeletal: no appreciable defects Psychiatric: normal mood and affect  Allergies Allergies  Allergen Reactions   Codeine Palpitations and Rash    Current Medications Patient's Medications  New Prescriptions   No medications on file  Previous Medications   ALBUTEROL  (VENTOLIN  HFA) 108 (90 BASE) MCG/ACT INHALER    TAKE 2 PUFFS BY MOUTH EVERY 6 HOURS AS NEEDED FOR WHEEZE OR  SHORTNESS OF BREATH   BUDESONIDE  (PULMICORT  FLEXHALER) 180 MCG/ACT INHALER    Inhale 2 puffs into the lungs in the morning and at bedtime.   FENOFIBRATE  (TRICOR ) 145 MG TABLET    TAKE 1 TABLET BY MOUTH EVERY DAY   METHIMAZOLE  (TAPAZOLE ) 10 MG TABLET    Take 1 tablet (10 mg total) by mouth daily.   METOPROLOL  SUCCINATE (TOPROL -XL) 100 MG 24 HR TABLET    Take 100 mg by mouth in the morning and at bedtime. Take with or immediately following a meal.   MONTELUKAST  (SINGULAIR ) 10 MG TABLET    Take 1 tablet (10 mg total) by mouth at bedtime.   MULTIPLE VITAMIN (MULTIVITAMIN WITH MINERALS) TABS TABLET    Take 1 tablet by mouth in the morning. Centrum Silver   RIVAROXABAN  (XARELTO ) 10 MG TABS TABLET    Take 1 tablet (10 mg total) by mouth daily. Plan to start taking the morning of May 9 and continue taking 10 mg once daily for 7 days after surgery.   RIVAROXABAN  (XARELTO ) 20 MG TABS TABLET    Take 20 mg by mouth in the morning.   VALSARTAN -HYDROCHLOROTHIAZIDE  (DIOVAN -HCT) 160-12.5 MG TABLET    TAKE 1 TABLET BY MOUTH EVERY DAY  Modified Medications   No medications on file  Discontinued Medications   No medications on file    Past Medical History Past Medical History:  Diagnosis Date   Allergic state    Anxiety    when father passed   Arthritis    Asthma, mild persistent    allergy induced asthma    Cancer (HCC)  endometrial cancer   Cervical cancer screening 04/27/2011   Dysrhythmia    A. fib   Edema 04/27/2011   Graves disease 02/2023   Heart murmur    as an infant    Hyperlipidemia 04/27/2011   Hypertension 2010   Hypokalemia 04/27/2011   Measles as a child   Mumps as a child   Muscle cramp 10/14/2015   Obesity    Osteoarthritis of left knee 10/14/2015   Plantar fasciitis, left 01/19/2013   PONV (postoperative nausea and vomiting)    Preventative health care 02/13/2011   Sinusitis 02/13/2011    Past Surgical History Past Surgical History:  Procedure Laterality Date    CYSTOSCOPY  05/16/2023   Procedure: CYSTOSCOPY;  Surgeon: Viktoria Comer SAUNDERS, MD;  Location: [facility];  Service: Gynecology;;   ENDOMETRIAL ABLATION  01/10/2004   menorraghia   INJECTION, FOR SENTINEL LYMPH NODE IDENTIFICATION N/A 05/16/2023   Procedure: INJECTION, FOR SENTINEL LYMPH NODE IDENTIFICATION;  Surgeon: Viktoria Comer SAUNDERS, MD;  Location: [facility];  Service: Gynecology;  Laterality: N/A;   KNEE SURGERY Left 01/10/2004   left knee  arthroscopy   LYMPH NODE BIOPSY N/A 05/16/2023   Procedure: LYMPH NODE BIOPSY;  Surgeon: Viktoria Comer SAUNDERS, MD;  Location: [facility];  Service: Gynecology;  Laterality: N/A;   ROBOTIC ASSISTED TOTAL HYSTERECTOMY WITH BILATERAL SALPINGO OOPHERECTOMY N/A 05/16/2023   Procedure: HYSTERECTOMY, TOTAL, ROBOT-ASSISTED, LAPAROSCOPIC, WITH BILATERAL SALPINGO-OOPHORECTOMY;  Surgeon: Viktoria Comer SAUNDERS, MD;  Location: [facility];  Service: Gynecology;  Laterality: N/A;   TONSILLECTOMY  1978   TOTAL KNEE ARTHROPLASTY Left 10/15/2015   Procedure: LEFT TOTAL KNEE ARTHROPLASTY;  Surgeon: Lamar Collet, MD;  Location: [facility];  Service: Orthopedics;  Laterality: Left;   TUBAL LIGATION     VULVA /PERINEUM BIOPSY  05/16/2023   Procedure: BIOPSY, VULVA;  Surgeon: Viktoria Comer SAUNDERS, MD;  Location: [facility];  Service: Gynecology;;    Family History family history includes Asthma in her daughter and daughter; Breast cancer in her paternal grandmother; Cancer in her father, maternal grandmother, and paternal grandmother; Cancer (age of onset: 31) in her mother; Factor V Leiden deficiency in her maternal aunt and maternal grandmother; Heart attack in her paternal grandfather; Stroke in her mother.  Social History Social History   Socioeconomic History   Marital status: Widowed    Spouse name: Not on file   Number of children: Not on file   Years of education: Not on file   Highest education level: Not on file  Occupational History   Not on file  Tobacco Use   Smoking status: Never    Smokeless tobacco: Never  Vaping Use   Vaping status: Never Used  Substance and Sexual Activity   Alcohol use: Yes    Comment: occas    Drug use: No   Sexual activity: Not Currently    Partners: Male  Other Topics Concern   Not on file  Social History Narrative   Not on file   Social Drivers of Health   Tobacco Use: Low Risk (12/28/2023)   Patient History    Smoking Tobacco Use: Never    Smokeless Tobacco Use: Never    Passive Exposure: Not on file  Financial Resource Strain: Not on file  Food Insecurity: No Food Insecurity (05/08/2023)   Hunger Vital Sign    Worried About Running Out of Food in the Last Year: Never true    Ran Out of Food in the Last Year: Never true  Transportation Needs: No Transportation  Needs (05/08/2023)   PRAPARE - Administrator, Civil Service (Medical): No    Lack of Transportation (Non-Medical): No  Physical Activity: Not on file  Stress: Not on file  Social Connections: Not on file  Intimate Partner Violence: Not At Risk (05/03/2023)   Humiliation, Afraid, Rape, and Kick questionnaire    Fear of Current or Ex-Partner: No    Emotionally Abused: No    Physically Abused: No    Sexually Abused: No  Depression (PHQ2-9): Low Risk (01/25/2023)   Depression (PHQ2-9)    PHQ-2 Score: 0  Alcohol Screen: Not on file  Housing: Low Risk (05/08/2023)   Housing Stability Vital Sign    Unable to Pay for Housing in the Last Year: No    Number of Times Moved in the Last Year: 0    Homeless in the Last Year: No  Utilities: Not At Risk (05/03/2023)   [employer] Utilities    Threatened with loss of utilities: No  Health Literacy: Not on file    Laboratory Investigations Lab Results  Component Value Date   TSH 3.73 11/20/2023   TSH 2.92 08/02/2023   TSH 0.621 05/10/2023   FREET4 1.3 11/20/2023   FREET4 0.94 05/10/2023   FREET4 1.2 05/03/2023     Lab Results  Component Value Date   TSI 389 (H) 03/16/2023     No components found for: TRAB   Lab  Results  Component Value Date   CHOL 148 01/25/2023   Lab Results  Component Value Date   HDL 38.60 (L) 01/25/2023   Lab Results  Component Value Date   LDLCALC 80 01/25/2023   Lab Results  Component Value Date   TRIG 144.0 01/25/2023   Lab Results  Component Value Date   CHOLHDL 4 01/25/2023   Lab Results  Component Value Date   CREATININE 0.58 05/10/2023   Lab Results  Component Value Date   GFR 97.74 01/26/2023      Component Value Date/Time   NA 139 05/10/2023 0855   K 3.5 05/10/2023 0855   CL 103 05/10/2023 0855   CO2 26 05/10/2023 0855   GLUCOSE 102 (H) 05/10/2023 0855   BUN 17 05/10/2023 0855   CREATININE 0.58 05/10/2023 0855   CREATININE 0.57 09/24/2019 1424   CALCIUM 9.2 05/10/2023 0855   PROT 7.2 05/10/2023 0855   ALBUMIN 3.6 05/10/2023 0855   AST 23 05/10/2023 0855   ALT 21 05/10/2023 0855   ALKPHOS 74 05/10/2023 0855   BILITOT 0.7 05/10/2023 0855   GFRNONAA >60 05/10/2023 0855   GFRAA >60 10/16/2015 0439      Latest Ref Rng & Units 05/10/2023    8:55 AM 01/26/2023    8:16 AM 01/25/2023    9:59 AM  BMP  Glucose 70 - 99 mg/dL 897  86  86   BUN 8 - 23 mg/dL 17  17  16    Creatinine 0.44 - 1.00 mg/dL 9.41  9.45  9.48   Sodium 135 - 145 mmol/L 139  143  141   Potassium 3.5 - 5.1 mmol/L 3.5  3.8  3.7   Chloride 98 - 111 mmol/L 103  105  105   CO2 22 - 32 mmol/L 26  25  29    Calcium 8.9 - 10.3 mg/dL 9.2  9.7  9.6        Component Value Date/Time   WBC 8.1 05/10/2023 0855   RBC 4.53 05/10/2023 0855   HGB 13.5 05/10/2023 0855  HCT 43.2 05/10/2023 0855   PLT 358 05/10/2023 0855   MCV 95.4 05/10/2023 0855   MCH 29.8 05/10/2023 0855   MCHC 31.3 05/10/2023 0855   RDW 13.3 05/10/2023 0855   LYMPHSABS 2.1 01/25/2023 0959   MONOABS 0.8 01/25/2023 0959   EOSABS 0.2 01/25/2023 0959   BASOSABS 0.1 01/25/2023 0959      Parts of this note may have been dictated using voice recognition software. There may be variances in spelling and vocabulary which  are unintentional. Not all errors are proofread. Please notify the dino if any discrepancies are noted or if the meaning of any statement is not clear.    "

## 2024-01-08 NOTE — Patient Instructions (Signed)
  If you notice any symptoms of worsening fatigue, fever with sore throat, loss of appetite, yellowing of eyes, dark urine, joint pains, sores in the mouth, itchy rash, light colored stools or abdominal pain, please stop the medication and call us immediately as this can be a serious side effect of the medication.

## 2024-01-21 ENCOUNTER — Encounter (HOSPITAL_COMMUNITY): Payer: Self-pay

## 2024-01-24 ENCOUNTER — Encounter: Payer: Self-pay | Admitting: "Endocrinology

## 2024-01-24 ENCOUNTER — Other Ambulatory Visit: Payer: Self-pay | Admitting: "Endocrinology

## 2024-01-24 DIAGNOSIS — E0500 Thyrotoxicosis with diffuse goiter without thyrotoxic crisis or storm: Secondary | ICD-10-CM

## 2024-01-24 DIAGNOSIS — E042 Nontoxic multinodular goiter: Secondary | ICD-10-CM

## 2024-01-27 NOTE — Assessment & Plan Note (Signed)
 Well controlled, no changes to meds. Encouraged heart healthy diet such as the DASH diet and exercise as tolerated.

## 2024-01-27 NOTE — Assessment & Plan Note (Signed)
Rate controlled tolerating current meds

## 2024-01-27 NOTE — Progress Notes (Signed)
 "  Subjective:    Patient ID: Kathleen Church, female    DOB: 11/18/1960, 64 y.o.   MRN: [id number]  Chief Complaint  Patient presents with   Annual Exam    Patient presents today for a physical exam.    HPI Discussed the use of AI scribe software for clinical note transcription with the patient, who gave verbal consent to proceed.  History of Present Illness Kathleen Church is a 64 year old female with thyroid  nodules who presents with concerns about potential thyroid  cancer and for annual preventative exam.  She has been diagnosed with thyroid  nodules, one approximately four centimeters and a smaller one on the opposite side. A biopsy indicated a 50% chance of cancer. She has consulted with a surgeon and is awaiting a CT scan to further evaluate the nodules and plan for surgery. She has a persistent cough. No fever, no productive cough, no recent ER visits, no chest pain, no bowel troubles.  She has a history of endometrial cancer, for which she underwent a hysterectomy last year after presenting with unexpected bleeding. She continues to follow up with her oncologist every six months.  She has a history of Graves' disease, diagnosed last year, managed with medication. This condition previously caused palpitations and blood pressure fluctuations, which have stabilized with treatment. She is currently on medication to manage her thyroid  levels.  Her family history includes her father having had kidney cancer. Her daughter is currently pregnant and has been diagnosed with preeclampsia, which is being managed.  She has a history of COVID-19, which led to heart rhythm issues, now resolved. 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  She is up to date with her vaccinations, including flu shots, and is planning to receive the RSV vaccine upon turning 64. She works from home three days a week, lives alone, and is  involved with her grandchildren.    Past Medical History:  Diagnosis Date   Allergic state    Allergy    Anxiety    when father passed   Arthritis    Asthma, mild persistent    allergy induced asthma    Cancer (HCC)    endometrial cancer   Cervical cancer screening 04/27/2011   Dysrhythmia    A. fib   Edema 04/27/2011   Graves disease 02/2023   Heart murmur    as an infant    Hyperlipidemia 04/27/2011   Hypertension 2010   Hypokalemia 04/27/2011   Measles as a child   Mumps as a child   Muscle cramp 10/14/2015   Obesity    Osteoarthritis of left knee 10/14/2015   Plantar fasciitis, left 01/19/2013   PONV (postoperative nausea and vomiting)    Preventative health care 02/13/2011   Sinusitis 02/13/2011    Past Surgical History:  Procedure Laterality Date   ABDOMINAL HYSTERECTOMY     CYSTOSCOPY  05/16/2023   Procedure: CYSTOSCOPY;  Surgeon: Viktoria Comer SAUNDERS, MD;  Location: [facility];  Service: Gynecology;;   ENDOMETRIAL ABLATION  01/10/2004   menorraghia   INJECTION, FOR SENTINEL LYMPH NODE IDENTIFICATION N/A 05/16/2023   Procedure: INJECTION, FOR SENTINEL LYMPH NODE IDENTIFICATION;  Surgeon: Viktoria Comer SAUNDERS, MD;  Location: [facility];  Service: Gynecology;  Laterality: N/A;   KNEE SURGERY Left 01/10/2004   left knee  arthroscopy   LYMPH NODE BIOPSY N/A 05/16/2023   Procedure: LYMPH NODE BIOPSY;  Surgeon: Viktoria Comer SAUNDERS,  MD;  Location: [facility];  Service: Gynecology;  Laterality: N/A;   ROBOTIC ASSISTED TOTAL HYSTERECTOMY WITH BILATERAL SALPINGO OOPHERECTOMY N/A 05/16/2023   Procedure: HYSTERECTOMY, TOTAL, ROBOT-ASSISTED, LAPAROSCOPIC, WITH BILATERAL SALPINGO-OOPHORECTOMY;  Surgeon: Viktoria Comer SAUNDERS, MD;  Location: [facility];  Service: Gynecology;  Laterality: N/A;   TONSILLECTOMY  1978   TOTAL KNEE ARTHROPLASTY Left 10/15/2015   Procedure: LEFT TOTAL KNEE ARTHROPLASTY;  Surgeon: Lamar Collet, MD;  Location: [facility];  Service: Orthopedics;  Laterality: Left;    TUBAL LIGATION     VULVA /PERINEUM BIOPSY  05/16/2023   Procedure: BIOPSY, VULVA;  Surgeon: Viktoria Comer SAUNDERS, MD;  Location: [facility];  Service: Gynecology;;    Family History  Problem Relation Age of Onset   Cancer Mother 37       metastatic lung cancer   Stroke Mother    Cancer Father        kidney   Factor V Leiden deficiency Maternal Aunt    Cancer Maternal Grandmother        bone   Factor V Leiden deficiency Maternal Grandmother    Cancer Paternal Grandmother        breast   Breast cancer Paternal Grandmother    Heart attack Paternal Grandfather    Asthma Daughter    Asthma Daughter    Ovarian cancer Neg Hx    Endometrial cancer Neg Hx    Pancreatic cancer Neg Hx    Colon cancer Neg Hx    Prostate cancer Neg Hx     Social History   Socioeconomic History   Marital status: Widowed    Spouse name: Not on file   Number of children: Not on file   Years of education: Not on file   Highest education level: Some college, no degree  Occupational History   Not on file  Tobacco Use   Smoking status: Never   Smokeless tobacco: Never  Vaping Use   Vaping status: Never Used  Substance and Sexual Activity   Alcohol use: Yes    Comment: Glass of wine at Thanksgiving   Drug use: Never   Sexual activity: Not Currently    Partners: Male  Other Topics Concern   Not on file  Social History Narrative   Not on file   Social Drivers of Health   Tobacco Use: Low Risk (01/31/2024)   Patient History    Smoking Tobacco Use: Never    Smokeless Tobacco Use: Never    Passive Exposure: Not on file  Financial Resource Strain: Low Risk (01/31/2024)   Overall Financial Resource Strain (CARDIA)    Difficulty of Paying Living Expenses: Not hard at all  Food Insecurity: No Food Insecurity (01/31/2024)   Epic    Worried About Radiation Protection Practitioner of Food in the Last Year: Never true    Ran Out of Food in the Last Year: Never true  Transportation Needs: No Transportation Needs (01/31/2024)    Epic    Lack of Transportation (Medical): No    Lack of Transportation (Non-Medical): No  Physical Activity: Not on file  Stress: Stress Concern Present (01/31/2024)   [employer] of Occupational Health - Occupational Stress Questionnaire    Feeling of Stress: To some extent  Social Connections: Socially Isolated (01/31/2024)   Social Connection and Isolation Panel    Frequency of Communication with Friends and Family: More than three times a week    Frequency of Social Gatherings with Friends and Family: Twice a week    Attends Religious  Services: Never    Active Member of Clubs or Organizations: No    Attends Banker Meetings: Not on file    Marital Status: Widowed  Intimate Partner Violence: Not At Risk (05/03/2023)   Humiliation, Afraid, Rape, and Kick questionnaire    Fear of Current or Ex-Partner: No    Emotionally Abused: No    Physically Abused: No    Sexually Abused: No  Depression (PHQ2-9): Low Risk (01/31/2024)   Depression (PHQ2-9)    PHQ-2 Score: 0  Alcohol Screen: Not on file  Housing: Unknown (01/31/2024)   Epic    Unable to Pay for Housing in the Last Year: No    Number of Times Moved in the Last Year: Not on file    Homeless in the Last Year: No  Utilities: Not At Risk (05/03/2023)   [facility] Utilities    Threatened with loss of utilities: No  Health Literacy: Not on file    Outpatient Medications Prior to Visit  Medication Sig Dispense Refill   albuterol  (VENTOLIN  HFA) 108 (90 Base) MCG/ACT inhaler TAKE 2 PUFFS BY MOUTH EVERY 6 HOURS AS NEEDED FOR WHEEZE OR SHORTNESS OF BREATH 18 each 3   budesonide  (PULMICORT  FLEXHALER) 180 MCG/ACT inhaler Inhale 2 puffs into the lungs in the morning and at bedtime. (Patient taking differently: Inhale 2 puffs into the lungs 2 (two) times daily as needed (allergies (spring & fall months ONLY)).) 1 each 5   fenofibrate  (TRICOR ) 145 MG tablet TAKE 1 TABLET BY MOUTH EVERY DAY 90 tablet 1   methimazole  (TAPAZOLE ) 10 MG  tablet Take 1 tablet (10 mg total) by mouth daily. 90 tablet 1   metoprolol  succinate (TOPROL -XL) 100 MG 24 hr tablet Take 100 mg by mouth in the morning and at bedtime. Take with or immediately following a meal.     montelukast  (SINGULAIR ) 10 MG tablet Take 1 tablet (10 mg total) by mouth at bedtime. 90 tablet 1   Multiple Vitamin (MULTIVITAMIN WITH MINERALS) TABS tablet Take 1 tablet by mouth in the morning. Centrum Silver     rivaroxaban  (XARELTO ) 20 MG TABS tablet Take 20 mg by mouth in the morning.     valsartan -hydrochlorothiazide  (DIOVAN -HCT) 160-12.5 MG tablet TAKE 1 TABLET BY MOUTH EVERY DAY 90 tablet 1   rivaroxaban  (XARELTO ) 10 MG TABS tablet Take 1 tablet (10 mg total) by mouth daily. Plan to start taking the morning of May 9 and continue taking 10 mg once daily for 7 days after surgery. 7 tablet 0   No facility-administered medications prior to visit.    Allergies[1]  Review of Systems  Constitutional:  Negative for chills, fever and malaise/fatigue.  HENT:  Negative for congestion and hearing loss.   Eyes:  Negative for discharge.  Respiratory:  Negative for cough, sputum production and shortness of breath.   Cardiovascular:  Negative for chest pain, palpitations and leg swelling.  Gastrointestinal:  Negative for abdominal pain, blood in stool, constipation, diarrhea, heartburn, nausea and vomiting.  Genitourinary:  Negative for dysuria, frequency, hematuria and urgency.  Musculoskeletal:  Negative for back pain, falls and myalgias.  Skin:  Negative for rash.  Neurological:  Negative for dizziness, sensory change, loss of consciousness, weakness and headaches.  Endo/Heme/Allergies:  Negative for environmental allergies. Does not bruise/bleed easily.  Psychiatric/Behavioral:  Negative for depression and suicidal ideas. The patient is not nervous/anxious and does not have insomnia.        Objective:    Physical Exam Constitutional:  General: She is not in acute  distress.    Appearance: Normal appearance. She is not diaphoretic.  HENT:     Head: Normocephalic and atraumatic.     Right Ear: Tympanic membrane, ear canal and external ear normal.     Left Ear: Tympanic membrane, ear canal and external ear normal.     Nose: Nose normal.     Mouth/Throat:     Mouth: Mucous membranes are moist.     Pharynx: Oropharynx is clear. No oropharyngeal exudate.  Eyes:     General: No scleral icterus.       Right eye: No discharge.        Left eye: No discharge.     Conjunctiva/sclera: Conjunctivae normal.     Pupils: Pupils are equal, round, and reactive to light.  Neck:     Thyroid : No thyromegaly.  Cardiovascular:     Rate and Rhythm: Normal rate and regular rhythm.     Heart sounds: Normal heart sounds. No murmur heard. Pulmonary:     Effort: Pulmonary effort is normal. No respiratory distress.     Breath sounds: Normal breath sounds. No wheezing or rales.  Abdominal:     General: Bowel sounds are normal. There is no distension.     Palpations: Abdomen is soft. There is no mass.     Tenderness: There is no abdominal tenderness.  Musculoskeletal:        General: No tenderness. Normal range of motion.     Cervical back: Normal range of motion and neck supple.  Lymphadenopathy:     Cervical: No cervical adenopathy.  Skin:    General: Skin is warm and dry.     Findings: No rash.  Neurological:     General: No focal deficit present.     Mental Status: She is alert and oriented to person, place, and time.     Cranial Nerves: No cranial nerve deficit.     Coordination: Coordination normal.     Deep Tendon Reflexes: Reflexes are normal and symmetric. Reflexes normal.  Psychiatric:        Mood and Affect: Mood normal.        Behavior: Behavior normal.        Thought Content: Thought content normal.        Judgment: Judgment normal.     BP 136/88   Pulse 63   Temp 98.2 F (36.8 C)   Resp 16   Ht 5' 8 (1.727 m)   Wt 262 lb 9.6 oz (119.1 kg)    SpO2 97%   BMI 39.93 kg/m  Wt Readings from Last 3 Encounters:  01/31/24 262 lb 9.6 oz (119.1 kg)  01/29/24 264 lb (119.7 kg)  01/08/24 264 lb (119.7 kg)    Diabetic Foot Exam - Simple   No data filed    Lab Results  Component Value Date   WBC 9.9 01/31/2024   HGB 14.0 01/31/2024   HCT 41.4 01/31/2024   PLT 386.0 01/31/2024   GLUCOSE 86 01/31/2024   CHOL 180 01/31/2024   TRIG 159.0 (H) 01/31/2024   HDL 42.00 01/31/2024   LDLDIRECT 101.0 11/25/2019   LDLCALC 106 (H) 01/31/2024   ALT 28 01/31/2024   AST 22 01/31/2024   NA 142 01/31/2024   K 3.8 01/31/2024   CL 104 01/31/2024   CREATININE 0.64 01/31/2024   BUN 17 01/31/2024   CO2 29 01/31/2024   TSH 2.07 01/31/2024   INR 1.03 10/08/2015   HGBA1C 5.0  01/31/2024    Lab Results  Component Value Date   TSH 2.07 01/31/2024   Lab Results  Component Value Date   WBC 9.9 01/31/2024   HGB 14.0 01/31/2024   HCT 41.4 01/31/2024   MCV 92.6 01/31/2024   PLT 386.0 01/31/2024   Lab Results  Component Value Date   NA 142 01/31/2024   K 3.8 01/31/2024   CO2 29 01/31/2024   GLUCOSE 86 01/31/2024   BUN 17 01/31/2024   CREATININE 0.64 01/31/2024   BILITOT 0.6 01/31/2024   ALKPHOS 69 01/31/2024   AST 22 01/31/2024   ALT 28 01/31/2024   PROT 7.3 01/31/2024   ALBUMIN 4.2 01/31/2024   CALCIUM 9.8 01/31/2024   ANIONGAP 10 05/10/2023   GFR 93.15 01/31/2024   Lab Results  Component Value Date   CHOL 180 01/31/2024   Lab Results  Component Value Date   HDL 42.00 01/31/2024   Lab Results  Component Value Date   LDLCALC 106 (H) 01/31/2024   Lab Results  Component Value Date   TRIG 159.0 (H) 01/31/2024   Lab Results  Component Value Date   CHOLHDL 4 01/31/2024   Lab Results  Component Value Date   HGBA1C 5.0 01/31/2024       Assessment & Plan:  Paroxysmal atrial fibrillation (HCC) Assessment & Plan: Rate controlled tolerating current meds  Orders: -     EKG 12-Lead  Preventative health  care Assessment & Plan: Patient encouraged to maintain heart healthy diet, regular exercise, adequate sleep. Consider daily probiotics. Take medications as prescribed.  Cologuard 01/2022 repeat in 27 MGM 2/17/ 2025 Pap 03/2024 repeat in 3-4 years Dexa 01/2021 repeat in 2-3 years  Orders: -     Comprehensive metabolic panel with GFR -     CBC with Differential/Platelet -     TSH -     Lipid panel -     Hemoglobin A1c  Uncomplicated asthma, unspecified asthma severity, unspecified whether persistent Assessment & Plan: she is managing on Pulmicort  and using Albuterol  infrequently.    Primary hypertension Assessment & Plan: Well controlled, no changes to meds. Encouraged heart healthy diet such as the DASH diet and exercise as tolerated.    Orders: -     Comprehensive metabolic panel with GFR -     CBC with Differential/Platelet  Hyperlipidemia, unspecified hyperlipidemia type Assessment & Plan: Encourage heart healthy diet such as MIND or DASH diet, increase exercise, avoid trans fats, simple carbohydrates and processed foods, consider a krill or fish or flaxseed oil cap daily.   Orders: -     Lipid panel  Myalgia Assessment & Plan: Increase hydration stop caffeine, Hyland's leg cramp med   Hyperthyroidism -     TSH  Palpitations -     EKG 12-Lead    Assessment and Plan Assessment & Plan Thyroid  nodule with high malignancy risk, planned for thyroidectomy Thyroid  nodule with a 50% chance of malignancy, measuring 4 cm, with a smaller nodule on the opposite side. Surgery is recommended due to size and malignancy risk. Postoperative hypothyroidism is expected and manageable with thyroid  medication. Prognosis for thyroid  cancer is generally good, with most being non-aggressive. - she has CT scan for surgical planning already scheduled - Will schedule thyroidectomy after CT scan results - Will monitor thyroid  function post-surgery with thyroid  medication  Graves'  disease Previously diagnosed with Graves' disease, an autoimmune condition causing hyperthyroidism. Currently managed with thyroid  medication, which has stabilized her condition and resolved palpitations. - Continue  current thyroid  medication regimen  Paroxysmal atrial fibrillation Previously experienced paroxysmal atrial fibrillation, likely exacerbated by Graves' disease. Currently well-managed with thyroid  medication. - Continue monitoring heart rhythm and blood pressure  Primary hypertension Hypertension, previously exacerbated by Graves' disease. Currently well-controlled with thyroid  medication. - Continue monitoring blood pressure  Obesity Management discussed, with potential initiation of weight loss medication post-thyroid  surgery. Insurance covers Zepbound and Wegovy for weight loss. Emphasis on lifestyle modifications including exercise and diet to improve overall health and reduce cancer risk. - Will consider starting weight loss medication post-thyroid  surgery - Encouraged regular exercise and healthy diet  Recording duration: 42 minutes     Harlene Horton, MD    [1]  Allergies Allergen Reactions   Codeine Palpitations and Rash   "

## 2024-01-27 NOTE — Assessment & Plan Note (Signed)
 Patient encouraged to maintain heart healthy diet, regular exercise, adequate sleep. Consider daily probiotics. Take medications as prescribed.  Cologuard 01/2022 repeat in 27 MGM 2/17/ 2025 Pap 03/2024 repeat in 3-4 years Dexa 01/2021 repeat in 2-3 years

## 2024-01-27 NOTE — Assessment & Plan Note (Signed)
Increase hydration stop caffeine, Hyland's leg cramp med

## 2024-01-27 NOTE — Assessment & Plan Note (Signed)
 she is managing on Pulmicort  and using Albuterol  infrequently.

## 2024-01-27 NOTE — Assessment & Plan Note (Signed)
 Encourage heart healthy diet such as MIND or DASH diet, increase exercise, avoid trans fats, simple carbohydrates and processed foods, consider a krill or fish or flaxseed oil cap daily.

## 2024-01-29 ENCOUNTER — Telehealth: Admitting: "Endocrinology

## 2024-01-29 ENCOUNTER — Encounter: Payer: Self-pay | Admitting: "Endocrinology

## 2024-01-29 VITALS — Ht 68.0 in | Wt 264.0 lb

## 2024-01-29 DIAGNOSIS — E0500 Thyrotoxicosis with diffuse goiter without thyrotoxic crisis or storm: Secondary | ICD-10-CM

## 2024-01-29 DIAGNOSIS — E042 Nontoxic multinodular goiter: Secondary | ICD-10-CM

## 2024-01-29 NOTE — Progress Notes (Signed)
 "  The patient reports they are currently: Kathleen Church. I spent 11 minutes on the video with the patient on the date of service. I spent an additional 5 minutes on pre- and post-visit activities on the date of service.   The patient was physically located in [place]  or a state in which I am permitted to provide care. The patient and/or parent/guardian understood that s/he may incur co-pays and cost sharing, and agreed to the telemedicine visit. The visit was reasonable and appropriate under the circumstances given the patient's presentation at the time.  The patient and/or parent/guardian understands the potential risks and limitations of this mode of treatment (including, but not limited to, the absence of in-person examination) and has agreed to be treated using telemedicine. The patient's/patient's family's questions regarding telemedicine have been answered.   The patient and/or parent/guardian will contact their provider's office for worsening conditions, and seek emergency medical treatment and/or call 911 if the patient deems either necessary.    Outpatient Endocrinology Note Kathleen Birmingham, MD  01/29/24   Kathleen Church [birth year] [id number]  Referring Provider: Domenica Harlene LABOR, MD Primary Care Provider: Domenica Harlene LABOR, MD Subjective  No chief complaint on file.  Assessment & Plan  Diagnoses and all orders for this visit:  Multinodular goiter  Graves disease     Kathleen Church is currently taking methimazole  10 mg qd. TSI/TRAb + Patient is currently biochemically euthyroid.  Discussed the etiology for hyperthyroidism. Educated on thyroid  axis.  Recommend the following: Take methimazole  10 mg once a day. Repeat labs in 3 months or sooner if symptoms of hyper or hypothyroidism develop.  Educated on definitive options of treatment including RAI therapy and surgery. Patient does not want RAI at this time.  Counseled on: -complications of untreated hyperthyroidism  including atrial fibrillation, heart failure and osteoporosis -side effects of Methimazole  including but not limited to allergic reaction, rash, bone marrow suppression and liver dysfunction -compliance and follow up needs    + thyromegaly note on right side No baseline thyroid  U/S: ordered 12/05/23: reviewed images and report of US  thyroid : Multinodular goiter. Nodule 1 (TR3 right mid: 4.7 cm x 2.6 x 4.2 cm) meets criteria for FNA. Nodule 2 (left mid: TR4 1.2 cm x 0.6 x 0.9 cm) meets criteria for imaging follow-up. Annual ultrasound surveillance is recommended until 5 years of stability is documented. 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); Afirma results came back as 50% suspicious: explained the results in length to the patient, patient understand and agreed  Ordered referral to surgeon Kathleen Church for a potential total thyroidectomy; patient meets with him tomorrow   Explained at length all possibilities of FNA results; patient understands and agreed  If you notice any symptoms of worsening fatigue, fever with sore throat, loss of appetite, yellowing of eyes, dark urine, joint pains, sores in the mouth, itchy rash, light colored stools or abdominal pain, please stop the medication and call us  immediately as this can be a serious side effect of the medication.  I have reviewed current medications, nurse's notes, allergies, vital signs, past medical and surgical history, family medical history, and social history for this encounter. Counseled patient on symptoms, examination findings, lab findings, imaging results, treatment decisions and monitoring and prognosis. The patient understood the recommendations and agrees with the treatment plan. All questions regarding treatment plan were fully answered.   No follow-ups on  file.   Kathleen Birmingham, MD  01/29/24   I have reviewed current medications,  nurse's notes, allergies, vital signs, past medical and surgical history, family medical history, and social history for this encounter. Counseled patient on symptoms, examination findings, lab findings, imaging results, treatment decisions and monitoring and prognosis. The patient understood the recommendations and agrees with the treatment plan. All questions regarding treatment plan were fully answered.   History of Present Illness Kathleen Church is a 64 y.o. year old female who presents to our clinic with Grave's diagnosed in 2025.    Diagnosed of endometrial cancer pending hysterectomy.   Symptoms suggestive of HYPOTHYROIDISM:  fatigue Yes, reports working crazy hours weight gain No cold intolerance  Yes, always cold constipation  No  Symptoms suggestive of HYPERTHYROIDISM:  weight loss  No heat intolerance No hyperdefecation  No palpitations  No  Compressive symptoms:  dysphagia  No dysphonia  No positional dyspnea (especially with simultaneous arms elevation)  No  Smokes  No On biotin  No Personal history of head/neck surgery/irradiation  No  Adverse Drug Effects from Methimazole  (MMI): rash No fever No throat pain No arthritis No mouth ulcers No jaundice No loss of appetite No lymphadenopathy No  Grave's Ophthalmopathy Clinical Activity Score: 0/9    Physical Exam  Ht 5' 8 (1.727 m)   Wt 264 lb (119.7 kg)   BMI 40.14 kg/m  Constitutional: well developed, well nourished Head: normocephalic, atraumatic, no exophthalmos Eyes: sclera anicteric, no redness Neck: + R thyromegaly, + R thyroid  nodule visualized Lungs: normal respiratory effort Neurology: alert and oriented, no fine hand tremor Skin: dry, no appreciable rashes Musculoskeletal: no appreciable defects Psychiatric: normal mood and affect  Allergies Allergies  Allergen Reactions   Codeine Palpitations and Rash    Current Medications Patient's Medications  New Prescriptions   No  medications on file  Previous Medications   ALBUTEROL  (VENTOLIN  HFA) 108 (90 BASE) MCG/ACT INHALER    TAKE 2 PUFFS BY MOUTH EVERY 6 HOURS AS NEEDED FOR WHEEZE OR SHORTNESS OF BREATH   BUDESONIDE  (PULMICORT  FLEXHALER) 180 MCG/ACT INHALER    Inhale 2 puffs into the lungs in the morning and at bedtime.   FENOFIBRATE  (TRICOR ) 145 MG TABLET    TAKE 1 TABLET BY MOUTH EVERY DAY   METHIMAZOLE  (TAPAZOLE ) 10 MG TABLET    Take 1 tablet (10 mg total) by mouth daily.   METOPROLOL  SUCCINATE (TOPROL -XL) 100 MG 24 HR TABLET    Take 100 mg by mouth in the morning and at bedtime. Take with or immediately following a meal.   MONTELUKAST  (SINGULAIR ) 10 MG TABLET    Take 1 tablet (10 mg total) by mouth at bedtime.   MULTIPLE VITAMIN (MULTIVITAMIN WITH MINERALS) TABS TABLET    Take 1 tablet by mouth in the morning. Centrum Silver   RIVAROXABAN  (XARELTO ) 10 MG TABS TABLET    Take 1 tablet (10 mg total) by mouth daily. Plan to start taking the morning of May 9 and continue taking 10 mg once daily for 7 days after surgery.   RIVAROXABAN  (XARELTO ) 20 MG TABS TABLET    Take 20 mg by mouth in the morning.   VALSARTAN -HYDROCHLOROTHIAZIDE  (DIOVAN -HCT) 160-12.5 MG TABLET    TAKE 1 TABLET BY MOUTH EVERY DAY  Modified Medications   No medications on file  Discontinued Medications   No medications on file    Past Medical History Past Medical History:  Diagnosis Date   Allergic state    Anxiety  when father passed   Arthritis    Asthma, mild persistent    allergy induced asthma    Cancer (HCC)    endometrial cancer   Cervical cancer screening 04/27/2011   Dysrhythmia    A. fib   Edema 04/27/2011   Graves disease 02/2023   Heart murmur    as an infant    Hyperlipidemia 04/27/2011   Hypertension 2010   Hypokalemia 04/27/2011   Measles as a child   Mumps as a child   Muscle cramp 10/14/2015   Obesity    Osteoarthritis of left knee 10/14/2015   Plantar fasciitis, left 01/19/2013   PONV (postoperative nausea  and vomiting)    Preventative health care 02/13/2011   Sinusitis 02/13/2011    Past Surgical History Past Surgical History:  Procedure Laterality Date   CYSTOSCOPY  05/16/2023   Procedure: CYSTOSCOPY;  Surgeon: Viktoria Comer SAUNDERS, MD;  Location: [facility];  Service: Gynecology;;   ENDOMETRIAL ABLATION  01/10/2004   menorraghia   INJECTION, FOR SENTINEL LYMPH NODE IDENTIFICATION N/A 05/16/2023   Procedure: INJECTION, FOR SENTINEL LYMPH NODE IDENTIFICATION;  Surgeon: Viktoria Comer SAUNDERS, MD;  Location: [facility];  Service: Gynecology;  Laterality: N/A;   KNEE SURGERY Left 01/10/2004   left knee  arthroscopy   LYMPH NODE BIOPSY N/A 05/16/2023   Procedure: LYMPH NODE BIOPSY;  Surgeon: Viktoria Comer SAUNDERS, MD;  Location: [facility];  Service: Gynecology;  Laterality: N/A;   ROBOTIC ASSISTED TOTAL HYSTERECTOMY WITH BILATERAL SALPINGO OOPHERECTOMY N/A 05/16/2023   Procedure: HYSTERECTOMY, TOTAL, ROBOT-ASSISTED, LAPAROSCOPIC, WITH BILATERAL SALPINGO-OOPHORECTOMY;  Surgeon: Viktoria Comer SAUNDERS, MD;  Location: [facility];  Service: Gynecology;  Laterality: N/A;   TONSILLECTOMY  1978   TOTAL KNEE ARTHROPLASTY Left 10/15/2015   Procedure: LEFT TOTAL KNEE ARTHROPLASTY;  Surgeon: Lamar Collet, MD;  Location: [facility];  Service: Orthopedics;  Laterality: Left;   TUBAL LIGATION     VULVA /PERINEUM BIOPSY  05/16/2023   Procedure: BIOPSY, VULVA;  Surgeon: Viktoria Comer SAUNDERS, MD;  Location: [facility];  Service: Gynecology;;    Family History family history includes Asthma in her daughter and daughter; Breast cancer in her paternal grandmother; Cancer in her father, maternal grandmother, and paternal grandmother; Cancer (age of onset: 40) in her mother; Factor V Leiden deficiency in her maternal aunt and maternal grandmother; Heart attack in her paternal grandfather; Stroke in her mother.  Social History Social History   Socioeconomic History   Marital status: Widowed    Spouse name: Not on file   Number of children: Not on  file   Years of education: Not on file   Highest education level: Not on file  Occupational History   Not on file  Tobacco Use   Smoking status: Never   Smokeless tobacco: Never  Vaping Use   Vaping status: Never Used  Substance and Sexual Activity   Alcohol use: Yes    Comment: occas    Drug use: No   Sexual activity: Not Currently    Partners: Male  Other Topics Concern   Not on file  Social History Narrative   Not on file   Social Drivers of Health   Tobacco Use: Low Risk (01/29/2024)   Patient History    Smoking Tobacco Use: Never    Smokeless Tobacco Use: Never    Passive Exposure: Not on file  Financial Resource Strain: Not on file  Food Insecurity: No Food Insecurity (05/08/2023)   Hunger Vital Sign    Worried About Running Out  of Food in the Last Year: Never true    Ran Out of Food in the Last Year: Never true  Transportation Needs: No Transportation Needs (05/08/2023)   PRAPARE - Administrator, Civil Service (Medical): No    Lack of Transportation (Non-Medical): No  Physical Activity: Not on file  Stress: Not on file  Social Connections: Not on file  Intimate Partner Violence: Not At Risk (05/03/2023)   Humiliation, Afraid, Rape, and Kick questionnaire    Fear of Current or Ex-Partner: No    Emotionally Abused: No    Physically Abused: No    Sexually Abused: No  Depression (PHQ2-9): Low Risk (01/25/2023)   Depression (PHQ2-9)    PHQ-2 Score: 0  Alcohol Screen: Not on file  Housing: Low Risk (05/08/2023)   Housing Stability Vital Sign    Unable to Pay for Housing in the Last Year: No    Number of Times Moved in the Last Year: 0    Homeless in the Last Year: No  Utilities: Not At Risk (05/03/2023)   AHC Utilities    Threatened with loss of utilities: No  Health Literacy: Not on file    Laboratory Investigations Lab Results  Component Value Date   TSH 3.73 11/20/2023   TSH 2.92 08/02/2023   TSH 0.621 05/10/2023   FREET4 1.3 11/20/2023    FREET4 0.94 05/10/2023   FREET4 1.2 05/03/2023     Lab Results  Component Value Date   TSI 389 (H) 03/16/2023     No components found for: TRAB   Lab Results  Component Value Date   CHOL 148 01/25/2023   Lab Results  Component Value Date   HDL 38.60 (L) 01/25/2023   Lab Results  Component Value Date   LDLCALC 80 01/25/2023   Lab Results  Component Value Date   TRIG 144.0 01/25/2023   Lab Results  Component Value Date   CHOLHDL 4 01/25/2023   Lab Results  Component Value Date   CREATININE 0.58 05/10/2023   Lab Results  Component Value Date   GFR 97.74 01/26/2023      Component Value Date/Time   NA 139 05/10/2023 0855   K 3.5 05/10/2023 0855   CL 103 05/10/2023 0855   CO2 26 05/10/2023 0855   GLUCOSE 102 (H) 05/10/2023 0855   BUN 17 05/10/2023 0855   CREATININE 0.58 05/10/2023 0855   CREATININE 0.57 09/24/2019 1424   CALCIUM 9.2 05/10/2023 0855   PROT 7.2 05/10/2023 0855   ALBUMIN 3.6 05/10/2023 0855   AST 23 05/10/2023 0855   ALT 21 05/10/2023 0855   ALKPHOS 74 05/10/2023 0855   BILITOT 0.7 05/10/2023 0855   GFRNONAA >60 05/10/2023 0855   GFRAA >60 10/16/2015 0439      Latest Ref Rng & Units 05/10/2023    8:55 AM 01/26/2023    8:16 AM 01/25/2023    9:59 AM  BMP  Glucose 70 - 99 mg/dL 897  86  86   BUN 8 - 23 mg/dL 17  17  16    Creatinine 0.44 - 1.00 mg/dL 9.41  9.45  9.48   Sodium 135 - 145 mmol/L 139  143  141   Potassium 3.5 - 5.1 mmol/L 3.5  3.8  3.7   Chloride 98 - 111 mmol/L 103  105  105   CO2 22 - 32 mmol/L 26  25  29    Calcium 8.9 - 10.3 mg/dL 9.2  9.7  9.6  Component Value Date/Time   WBC 8.1 05/10/2023 0855   RBC 4.53 05/10/2023 0855   HGB 13.5 05/10/2023 0855   HCT 43.2 05/10/2023 0855   PLT 358 05/10/2023 0855   MCV 95.4 05/10/2023 0855   MCH 29.8 05/10/2023 0855   MCHC 31.3 05/10/2023 0855   RDW 13.3 05/10/2023 0855   LYMPHSABS 2.1 01/25/2023 0959   MONOABS 0.8 01/25/2023 0959   EOSABS 0.2 01/25/2023 0959   BASOSABS  0.1 01/25/2023 0959      Parts of this note may have been dictated using voice recognition software. There may be variances in spelling and vocabulary which are unintentional. Not all errors are proofread. Please notify the dino if any discrepancies are noted or if the meaning of any statement is not clear.    "

## 2024-01-31 ENCOUNTER — Ambulatory Visit: Payer: Self-pay | Admitting: Family Medicine

## 2024-01-31 ENCOUNTER — Ambulatory Visit: Payer: 59 | Admitting: Family Medicine

## 2024-01-31 VITALS — BP 136/88 | HR 63 | Temp 98.2°F | Resp 16 | Ht 68.0 in | Wt 262.6 lb

## 2024-01-31 DIAGNOSIS — J45909 Unspecified asthma, uncomplicated: Secondary | ICD-10-CM

## 2024-01-31 DIAGNOSIS — E0590 Thyrotoxicosis, unspecified without thyrotoxic crisis or storm: Secondary | ICD-10-CM

## 2024-01-31 DIAGNOSIS — M791 Myalgia, unspecified site: Secondary | ICD-10-CM | POA: Diagnosis not present

## 2024-01-31 DIAGNOSIS — I10 Essential (primary) hypertension: Secondary | ICD-10-CM

## 2024-01-31 DIAGNOSIS — I480 Paroxysmal atrial fibrillation: Secondary | ICD-10-CM

## 2024-01-31 DIAGNOSIS — E785 Hyperlipidemia, unspecified: Secondary | ICD-10-CM

## 2024-01-31 DIAGNOSIS — R002 Palpitations: Secondary | ICD-10-CM | POA: Diagnosis not present

## 2024-01-31 DIAGNOSIS — Z0000 Encounter for general adult medical examination without abnormal findings: Secondary | ICD-10-CM

## 2024-01-31 LAB — CBC WITH DIFFERENTIAL/PLATELET
Basophils Absolute: 0.1 K/uL (ref 0.0–0.1)
Basophils Relative: 0.7 % (ref 0.0–3.0)
Eosinophils Absolute: 0.3 K/uL (ref 0.0–0.7)
Eosinophils Relative: 2.7 % (ref 0.0–5.0)
HCT: 41.4 % (ref 36.0–46.0)
Hemoglobin: 14 g/dL (ref 12.0–15.0)
Lymphocytes Relative: 20.3 % (ref 12.0–46.0)
Lymphs Abs: 2 K/uL (ref 0.7–4.0)
MCHC: 33.7 g/dL (ref 30.0–36.0)
MCV: 92.6 fl (ref 78.0–100.0)
Monocytes Absolute: 0.7 K/uL (ref 0.1–1.0)
Monocytes Relative: 7.6 % (ref 3.0–12.0)
Neutro Abs: 6.8 K/uL (ref 1.4–7.7)
Neutrophils Relative %: 68.7 % (ref 43.0–77.0)
Platelets: 386 K/uL (ref 150.0–400.0)
RBC: 4.47 Mil/uL (ref 3.87–5.11)
RDW: 13.5 % (ref 11.5–15.5)
WBC: 9.9 K/uL (ref 4.0–10.5)

## 2024-01-31 LAB — LIPID PANEL
Cholesterol: 180 mg/dL (ref 28–200)
HDL: 42 mg/dL
LDL Cholesterol: 106 mg/dL — ABNORMAL HIGH (ref 10–99)
NonHDL: 137.92
Total CHOL/HDL Ratio: 4
Triglycerides: 159 mg/dL — ABNORMAL HIGH (ref 10.0–149.0)
VLDL: 31.8 mg/dL (ref 0.0–40.0)

## 2024-01-31 LAB — COMPREHENSIVE METABOLIC PANEL WITH GFR
ALT: 28 U/L (ref 3–35)
AST: 22 U/L (ref 5–37)
Albumin: 4.2 g/dL (ref 3.5–5.2)
Alkaline Phosphatase: 69 U/L (ref 39–117)
BUN: 17 mg/dL (ref 6–23)
CO2: 29 meq/L (ref 19–32)
Calcium: 9.8 mg/dL (ref 8.4–10.5)
Chloride: 104 meq/L (ref 96–112)
Creatinine, Ser: 0.64 mg/dL (ref 0.40–1.20)
GFR: 93.15 mL/min
Glucose, Bld: 86 mg/dL (ref 70–99)
Potassium: 3.8 meq/L (ref 3.5–5.1)
Sodium: 142 meq/L (ref 135–145)
Total Bilirubin: 0.6 mg/dL (ref 0.2–1.2)
Total Protein: 7.3 g/dL (ref 6.0–8.3)

## 2024-01-31 LAB — TSH: TSH: 2.07 u[IU]/mL (ref 0.35–5.50)

## 2024-01-31 LAB — HEMOGLOBIN A1C: Hgb A1c MFr Bld: 5 % (ref 4.6–6.5)

## 2024-01-31 NOTE — Progress Notes (Signed)
 Patient was advised and verbalized understanding.

## 2024-01-31 NOTE — Patient Instructions (Addendum)
 RSV, Respiratory Syncitial Virus Vaccine, Arexvy at [facility] pharmacy at age 65 All [facility] pharmacies are walk in vaccine clinics M-F 9-4 Annual Flu and Covid vaccines  Preventive Care 29-38 Years Old, Female Preventive care refers to lifestyle choices and visits with your health care provider that can promote health and wellness. Preventive care visits are also called wellness exams. What can I expect for my preventive care visit? Counseling Your health care provider may ask you questions about your: Medical history, including: Past medical problems. Family medical history. Pregnancy history. Current health, including: Menstrual cycle. Method of birth control. Emotional well-being. Home life and relationship well-being. Sexual activity and sexual health. Lifestyle, including: Alcohol, nicotine or tobacco, and drug use. Access to firearms. Diet, exercise, and sleep habits. Work and work astronomer. Sunscreen use. Safety issues such as seatbelt and bike helmet use. Physical exam Your health care provider will check your: Height and weight. These may be used to calculate your BMI (body mass index). BMI is a measurement that tells if you are at a healthy weight. Waist circumference. This measures the distance around your waistline. This measurement also tells if you are at a healthy weight and may help predict your risk of certain diseases, such as type 2 diabetes and high blood pressure. Heart rate and blood pressure. Body temperature. Skin for abnormal spots. What immunizations do I need?  Vaccines are usually given at various ages, according to a schedule. Your health care provider will recommend vaccines for you based on your age, medical history, and lifestyle or other factors, such as travel or where you work. What tests do I need? Screening Your health care provider may recommend screening tests for certain conditions. This may include: Lipid and cholesterol levels. Diabetes  screening. This is done by checking your blood sugar (glucose) after you have not eaten for a while (fasting). Pelvic exam and Pap test. Hepatitis B test. Hepatitis C test. HIV (human immunodeficiency virus) test. STI (sexually transmitted infection) testing, if you are at risk. Lung cancer screening. Colorectal cancer screening. Mammogram. Talk with your health care provider about when you should start having regular mammograms. This may depend on whether you have a family history of breast cancer. BRCA-related cancer screening. This may be done if you have a family history of breast, ovarian, tubal, or peritoneal cancers. Bone density scan. This is done to screen for osteoporosis. Talk with your health care provider about your test results, treatment options, and if necessary, the need for more tests. Follow these instructions at home: Eating and drinking  Eat a diet that includes fresh fruits and vegetables, whole grains, lean protein, and low-fat dairy products. Take vitamin and mineral supplements as recommended by your health care provider. Do not drink alcohol if: Your health care provider tells you not to drink. You are pregnant, may be pregnant, or are planning to become pregnant. If you drink alcohol: Limit how much you have to 0-1 drink a day. Know how much alcohol is in your drink. In the [place] one drink equals one 12 oz bottle of beer (355 mL), one 5 oz glass of wine (148 mL), or one 1 oz glass of hard liquor (44 mL). Lifestyle Brush your teeth every morning and night with fluoride toothpaste. Floss one time each day. Exercise for at least 30 minutes 5 or more days each week. Do not use any products that contain nicotine or tobacco. These products include cigarettes, chewing tobacco, and vaping devices, such as e-cigarettes. If you need  help quitting, ask your health care provider. Do not use drugs. If you are sexually active, practice safe sex. Use a condom or other form of  protection to prevent STIs. If you do not wish to become pregnant, use a form of birth control. If you plan to become pregnant, see your health care provider for a prepregnancy visit. Take aspirin  only as told by your health care provider. Make sure that you understand how much to take and what form to take. Work with your health care provider to find out whether it is safe and beneficial for you to take aspirin  daily. Find healthy ways to manage stress, such as: Meditation, yoga, or listening to music. Journaling. Talking to a trusted person. Spending time with friends and family. Minimize exposure to UV radiation to reduce your risk of skin cancer. Safety Always wear your seat belt while driving or riding in a vehicle. Do not drive: If you have been drinking alcohol. Do not ride with someone who has been drinking. When you are tired or distracted. While texting. If you have been using any mind-altering substances or drugs. Wear a helmet and other protective equipment during sports activities. If you have firearms in your house, make sure you follow all gun safety procedures. Seek help if you have been physically or sexually abused. What's next? Visit your health care provider once a year for an annual wellness visit. Ask your health care provider how often you should have your eyes and teeth checked. Stay up to date on all vaccines. This information is not intended to replace advice given to you by your health care provider. Make sure you discuss any questions you have with your health care provider. Document Revised: 06/23/2020 Document Reviewed: 06/23/2020 Elsevier Patient Education  2024 [employer].

## 2024-02-01 ENCOUNTER — Encounter: Payer: Self-pay | Admitting: Family Medicine

## 2024-03-18 ENCOUNTER — Other Ambulatory Visit

## 2024-03-25 ENCOUNTER — Ambulatory Visit: Admitting: "Endocrinology

## 2024-04-25 ENCOUNTER — Ambulatory Visit (HOSPITAL_COMMUNITY): Admit: 2024-04-25 | Admitting: General Surgery

## 2024-08-01 ENCOUNTER — Ambulatory Visit: Admitting: Student

## 2024-12-16 ENCOUNTER — Inpatient Hospital Stay: Admitting: Gynecologic Oncology
